# Patient Record
Sex: Male | Born: 1965 | Hispanic: Yes | Marital: Single | Smoking: Never smoker
Health system: Southern US, Community
[De-identification: ages and names within clinical notes are randomized; demographics above are authoritative.]

## PROBLEM LIST (undated history)

## (undated) DIAGNOSIS — E7800 Pure hypercholesterolemia, unspecified: Secondary | ICD-10-CM

## (undated) DIAGNOSIS — E119 Type 2 diabetes mellitus without complications: Secondary | ICD-10-CM

## (undated) DIAGNOSIS — F329 Major depressive disorder, single episode, unspecified: Secondary | ICD-10-CM

## (undated) DIAGNOSIS — F32A Depression, unspecified: Secondary | ICD-10-CM

## (undated) HISTORY — PX: BACK SURGERY: SHX140

---
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
## 2019-10-17 ENCOUNTER — Ambulatory Visit: Payer: Self-pay | Attending: Internal Medicine

## 2019-10-17 DIAGNOSIS — Z23 Encounter for immunization: Secondary | ICD-10-CM

## 2019-11-07 ENCOUNTER — Ambulatory Visit: Payer: Self-pay | Attending: Internal Medicine

## 2019-11-07 DIAGNOSIS — Z23 Encounter for immunization: Secondary | ICD-10-CM

## 2019-11-25 ENCOUNTER — Other Ambulatory Visit: Payer: Self-pay

## 2019-11-25 ENCOUNTER — Emergency Department
Admission: EM | Admit: 2019-11-25 | Discharge: 2019-11-25 | Disposition: A | Discharge status: Home / Self Care | Payer: Self-pay | Attending: Emergency Medicine | Admitting: Emergency Medicine

## 2019-11-25 ENCOUNTER — Emergency Department: Payer: Self-pay

## 2019-11-25 DIAGNOSIS — L03114 Cellulitis of left upper limb: Secondary | ICD-10-CM | POA: Insufficient documentation

## 2019-11-25 DIAGNOSIS — E119 Type 2 diabetes mellitus without complications: Secondary | ICD-10-CM | POA: Insufficient documentation

## 2019-11-25 DIAGNOSIS — L0390 Cellulitis, unspecified: Secondary | ICD-10-CM

## 2019-11-25 HISTORY — DX: Depression, unspecified: F32.A

## 2019-11-25 HISTORY — DX: Type 2 diabetes mellitus without complications: E11.9

## 2019-11-25 HISTORY — DX: Pure hypercholesterolemia, unspecified: E78.00

## 2019-11-25 IMAGING — CR DG FOREARM 2V*L*
2 series · 2 of 2 positions shown · non-contrast
Comparison: None.

CLINICAL DATA: Distal forearm edema.  No reported trauma

EXAM:
LEFT FOREARM - 2 VIEW

[forearm ap]
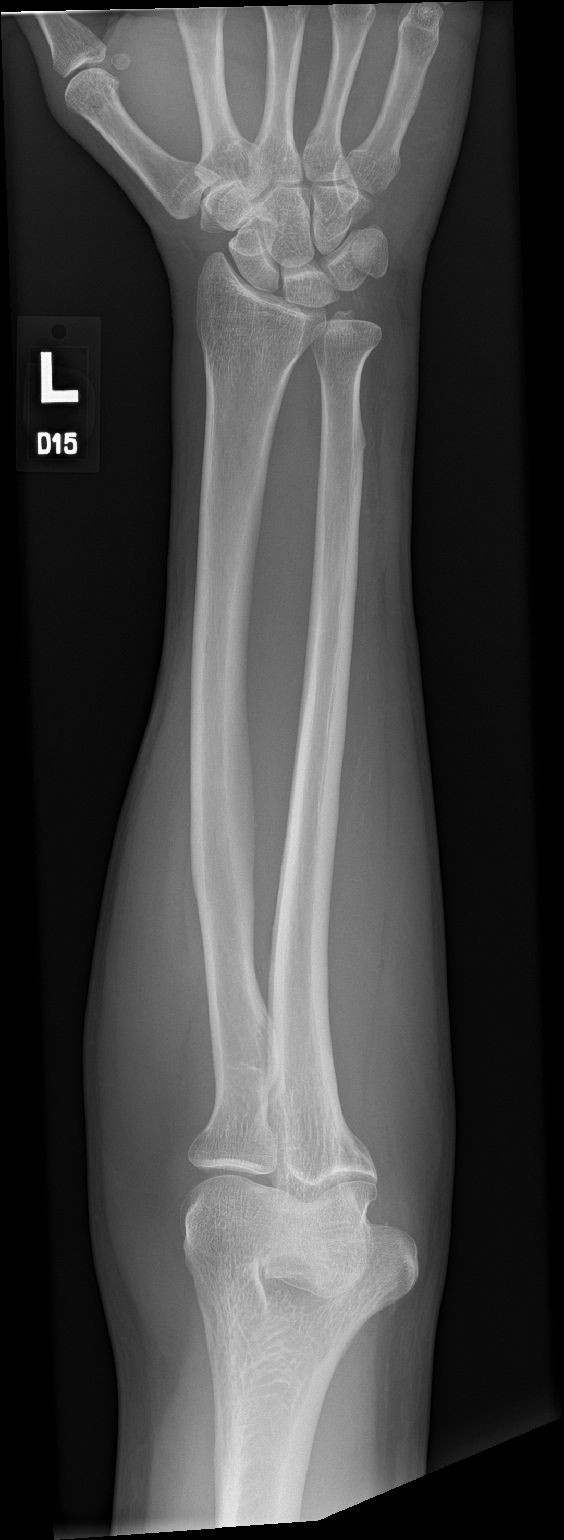

[forearm lat]
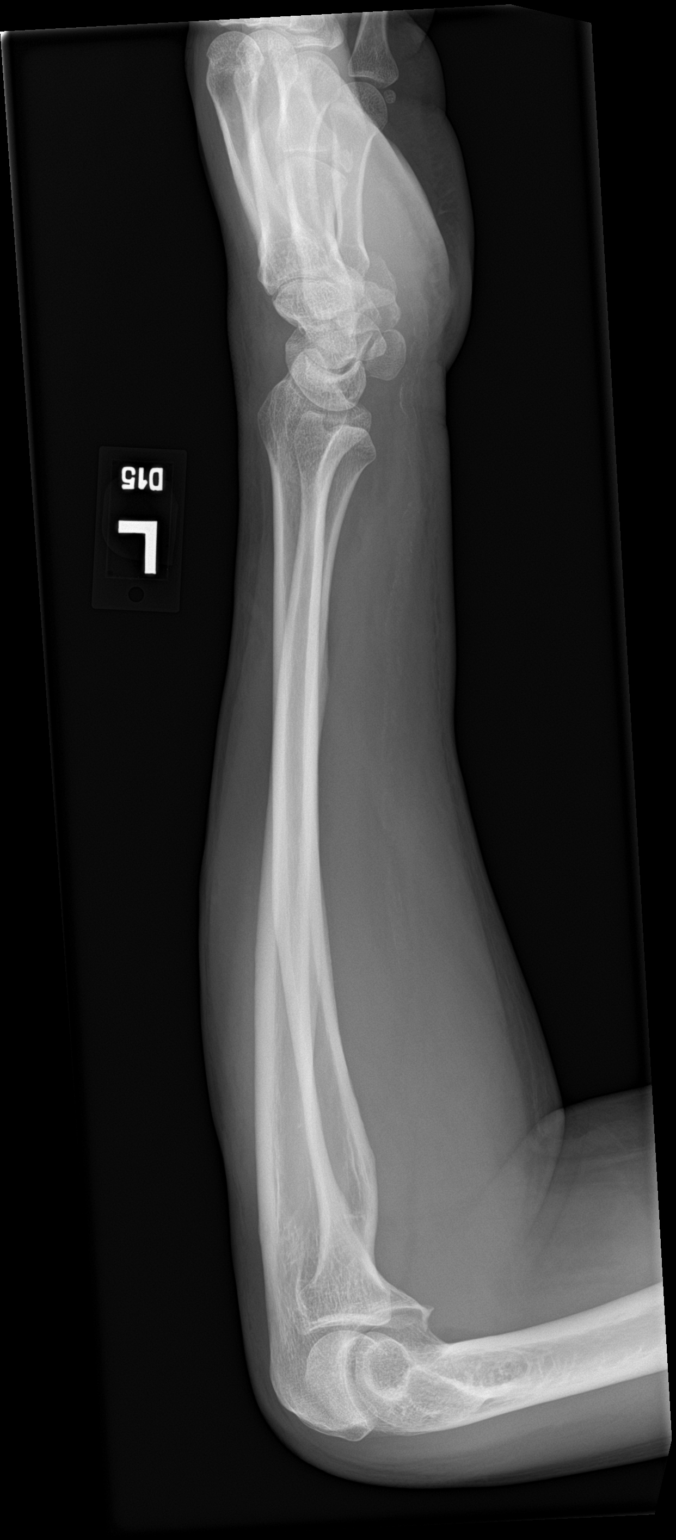

[2 of 2 positions shown; findings below may reference images not displayed]

FINDINGS: At the ventral and distal forearm nonspecific soft tissue swelling
superficially. No opaque foreign body. There is premature arterial
calcification correlating with diabetes history. No fracture or
erosion.
IMPRESSION: 1. Soft tissue swelling without opaque foreign body or gas.
2. Premature arterial calcification

## 2019-11-25 MED ORDER — NAPROXEN 500 MG PO TABS
500.0000 mg | ORAL_TABLET | Freq: Two times a day (BID) | ORAL | 2 refills | Status: AC
Start: 2019-11-25 — End: 2020-11-24

## 2019-11-25 MED ORDER — DOXYCYCLINE HYCLATE 100 MG PO CAPS
100.0000 mg | ORAL_CAPSULE | Freq: Two times a day (BID) | ORAL | 0 refills | Status: AC
Start: 2019-11-25 — End: 2019-12-05

## 2019-11-25 NOTE — ED Provider Notes (Signed)
[facility] Emergency Department Provider Note  ____________________________________________   I have reviewed the triage vital signs and the nursing notes.   HISTORY  Chief Complaint Arm Swelling   History limited by: Not Limited   HPI Jesus Flowers is a 54 y.o. male who presents to the emergency department today because of concerns for left hand swelling and pain.  He states that this started 2 days ago.  He denies any significant trauma to his hand.  States that he might of slept on it oddly the day before the pain started.  He has noticed some warmth to the hand.  Patient denies any fevers, nausea or vomiting.  Records reviewed. Per medical record review patient has a history of DM  Past Medical History:  Diagnosis Date  . Depression   . Diabetes mellitus without complication (HCC)   . High cholesterol     There are no problems to display for this patient.   Prior to Admission medications   Not on File    Allergies Patient has no allergy information on record.  No family history on file.  Social History Social History   Tobacco Use  . Smoking status: Not on file  Substance Use Topics  . Alcohol use: Not on file  . Drug use: Not on file    Review of Systems Constitutional: No fever/chills Eyes: No visual changes. ENT: No sore throat. Cardiovascular: Denies chest pain. Respiratory: Denies shortness of breath. Gastrointestinal: No abdominal pain.  No nausea, no vomiting.  No diarrhea.   Genitourinary: Negative for dysuria. Musculoskeletal: Positive for left hand pain. Skin: Positive for redness to left hand. Neurological: Negative for headaches, focal weakness or numbness.  ____________________________________________   PHYSICAL EXAM:  VITAL SIGNS: ED Triage Vitals [11/25/19 0907]  Enc Vitals Group     BP 110/66     Pulse Rate (!) 108     Resp 16     Temp 98.2 F (36.8 C)     Temp Source Oral     SpO2 99 %   Weight 145 lb (65.8 kg)     Height 5\' 10"  (1.778 m)     Head Circumference      Peak Flow      Pain Score 9    Constitutional: Alert and oriented.  Eyes: Conjunctivae are normal.  ENT      Head: Normocephalic and atraumatic.      Nose: No congestion/rhinnorhea.      Mouth/Throat: Mucous membranes are moist.      Neck: No stridor. Hematological/Lymphatic/Immunilogical: No cervical lymphadenopathy. Cardiovascular: Normal rate, regular rhythm.  No murmurs, rubs, or gallops. Respiratory: Normal respiratory effort without tachypnea nor retractions. Breath sounds are clear and equal bilaterally. No wheezes/rales/rhonchi. Gastrointestinal: Soft and non tender. No rebound. No guarding.  Genitourinary: Deferred Musculoskeletal: Left hand with some erythema and swelling. Warmth. Sensation intact in all fingers. No significant digit swelling.  Neurologic:  Normal speech and language. No gross focal neurologic deficits are appreciated.  Skin:  Skin is warm, dry and intact. No rash noted. Psychiatric: Mood and affect are normal. Speech and behavior are normal. Patient exhibits appropriate insight and judgment.  ____________________________________________    LABS (pertinent positives/negatives)  None  ____________________________________________   EKG  None  ____________________________________________    RADIOLOGY  Left forearm Soft tissue swelling  ____________________________________________   PROCEDURES  Procedures  ____________________________________________   INITIAL IMPRESSION / ASSESSMENT AND PLAN / ED COURSE  Pertinent labs & imaging results that  were available during my care of the patient were reviewed by me and considered in my medical decision making (see chart for details).   Patient presented to the emergency department today because of concerns for left hand swelling and pain.  Exam is consistent with cellulitis.  This point do not think tenosynovitis  is unlikely.  Discussed concern for infection with the patient.  Will plan on starting patient on antibiotics.  Will give patient orthopedic follow-up information  ___________________________________________   FINAL CLINICAL IMPRESSION(S) / ED DIAGNOSES  Final diagnoses:  Cellulitis, unspecified cellulitis site     Note: This dictation was prepared with Dragon dictation. Any transcriptional errors that result from this process are unintentional     Nance Pear, MD 11/25/19 1226

## 2019-11-25 NOTE — ED Notes (Signed)
Patient evaluated and discharged from Ed.

## 2019-11-25 NOTE — Discharge Instructions (Addendum)
Please seek medical attention for any high fevers, chest pain, shortness of breath, change in behavior, persistent vomiting, bloody stool or any other new or concerning symptoms.  

## 2021-05-03 ENCOUNTER — Emergency Department: Payer: Self-pay

## 2021-05-03 ENCOUNTER — Encounter: Payer: Self-pay | Admitting: Emergency Medicine

## 2021-05-03 ENCOUNTER — Other Ambulatory Visit: Payer: Self-pay

## 2021-05-03 ENCOUNTER — Inpatient Hospital Stay: Payer: Self-pay

## 2021-05-03 ENCOUNTER — Inpatient Hospital Stay
Admission: EM | Admit: 2021-05-03 | Discharge: 2021-05-11 | DRG: 854 | Disposition: A | Discharge status: Home / Self Care | Payer: Self-pay | Attending: Family Medicine | Admitting: Family Medicine

## 2021-05-03 DIAGNOSIS — Z20822 Contact with and (suspected) exposure to covid-19: Secondary | ICD-10-CM | POA: Diagnosis present

## 2021-05-03 DIAGNOSIS — E11628 Type 2 diabetes mellitus with other skin complications: Secondary | ICD-10-CM | POA: Diagnosis present

## 2021-05-03 DIAGNOSIS — Z23 Encounter for immunization: Secondary | ICD-10-CM

## 2021-05-03 DIAGNOSIS — L97513 Non-pressure chronic ulcer of other part of right foot with necrosis of muscle: Secondary | ICD-10-CM | POA: Diagnosis present

## 2021-05-03 DIAGNOSIS — Z833 Family history of diabetes mellitus: Secondary | ICD-10-CM

## 2021-05-03 DIAGNOSIS — E7800 Pure hypercholesterolemia, unspecified: Secondary | ICD-10-CM | POA: Insufficient documentation

## 2021-05-03 DIAGNOSIS — A419 Sepsis, unspecified organism: Secondary | ICD-10-CM | POA: Diagnosis present

## 2021-05-03 DIAGNOSIS — M86171 Other acute osteomyelitis, right ankle and foot: Secondary | ICD-10-CM | POA: Diagnosis present

## 2021-05-03 DIAGNOSIS — E1142 Type 2 diabetes mellitus with diabetic polyneuropathy: Secondary | ICD-10-CM

## 2021-05-03 DIAGNOSIS — F32A Depression, unspecified: Secondary | ICD-10-CM | POA: Diagnosis present

## 2021-05-03 DIAGNOSIS — M869 Osteomyelitis, unspecified: Secondary | ICD-10-CM | POA: Insufficient documentation

## 2021-05-03 DIAGNOSIS — I96 Gangrene, not elsewhere classified: Secondary | ICD-10-CM | POA: Diagnosis present

## 2021-05-03 DIAGNOSIS — E119 Type 2 diabetes mellitus without complications: Secondary | ICD-10-CM | POA: Insufficient documentation

## 2021-05-03 DIAGNOSIS — L02611 Cutaneous abscess of right foot: Secondary | ICD-10-CM | POA: Diagnosis present

## 2021-05-03 DIAGNOSIS — E440 Moderate protein-calorie malnutrition: Secondary | ICD-10-CM | POA: Insufficient documentation

## 2021-05-03 DIAGNOSIS — E1169 Type 2 diabetes mellitus with other specified complication: Secondary | ICD-10-CM | POA: Diagnosis present

## 2021-05-03 DIAGNOSIS — E1165 Type 2 diabetes mellitus with hyperglycemia: Secondary | ICD-10-CM | POA: Diagnosis not present

## 2021-05-03 DIAGNOSIS — A408 Other streptococcal sepsis: Principal | ICD-10-CM | POA: Diagnosis present

## 2021-05-03 DIAGNOSIS — N179 Acute kidney failure, unspecified: Secondary | ICD-10-CM | POA: Diagnosis present

## 2021-05-03 DIAGNOSIS — W228XXA Striking against or struck by other objects, initial encounter: Secondary | ICD-10-CM | POA: Diagnosis present

## 2021-05-03 DIAGNOSIS — E11621 Type 2 diabetes mellitus with foot ulcer: Secondary | ICD-10-CM

## 2021-05-03 DIAGNOSIS — I10 Essential (primary) hypertension: Secondary | ICD-10-CM | POA: Diagnosis present

## 2021-05-03 DIAGNOSIS — L03115 Cellulitis of right lower limb: Secondary | ICD-10-CM

## 2021-05-03 DIAGNOSIS — E1152 Type 2 diabetes mellitus with diabetic peripheral angiopathy with gangrene: Secondary | ICD-10-CM | POA: Diagnosis present

## 2021-05-03 DIAGNOSIS — S91331A Puncture wound without foreign body, right foot, initial encounter: Secondary | ICD-10-CM | POA: Diagnosis present

## 2021-05-03 DIAGNOSIS — L089 Local infection of the skin and subcutaneous tissue, unspecified: Secondary | ICD-10-CM | POA: Insufficient documentation

## 2021-05-03 LAB — CBC WITH DIFFERENTIAL/PLATELET
Abs Immature Granulocytes: 0.28 10*3/uL — ABNORMAL HIGH (ref 0.00–0.07)
Basophils Absolute: 0.1 10*3/uL (ref 0.0–0.1)
Basophils Relative: 0 %
Eosinophils Absolute: 0 10*3/uL (ref 0.0–0.5)
Eosinophils Relative: 0 %
HCT: 38.9 % — ABNORMAL LOW (ref 39.0–52.0)
Hemoglobin: 14.1 g/dL (ref 13.0–17.0)
Immature Granulocytes: 1 %
Lymphocytes Relative: 2 %
Lymphs Abs: 0.5 10*3/uL — ABNORMAL LOW (ref 0.7–4.0)
MCH: 34.9 pg — ABNORMAL HIGH (ref 26.0–34.0)
MCHC: 36.2 g/dL — ABNORMAL HIGH (ref 30.0–36.0)
MCV: 96.3 fL (ref 80.0–100.0)
Monocytes Absolute: 0.7 10*3/uL (ref 0.1–1.0)
Monocytes Relative: 3 %
Neutro Abs: 20.6 10*3/uL — ABNORMAL HIGH (ref 1.7–7.7)
Neutrophils Relative %: 94 %
Platelets: 443 10*3/uL — ABNORMAL HIGH (ref 150–400)
RBC: 4.04 MIL/uL — ABNORMAL LOW (ref 4.22–5.81)
RDW: 11.3 % — ABNORMAL LOW (ref 11.5–15.5)
WBC: 22.2 10*3/uL — ABNORMAL HIGH (ref 4.0–10.5)
nRBC: 0 % (ref 0.0–0.2)

## 2021-05-03 LAB — CBG MONITORING, ED
Glucose-Capillary: 364 mg/dL — ABNORMAL HIGH (ref 70–99)
Glucose-Capillary: 276 mg/dL — ABNORMAL HIGH (ref 70–99)

## 2021-05-03 LAB — GLUCOSE, CAPILLARY
Glucose-Capillary: 270 mg/dL — ABNORMAL HIGH (ref 70–99)
Glucose-Capillary: 228 mg/dL — ABNORMAL HIGH (ref 70–99)

## 2021-05-03 LAB — COMPREHENSIVE METABOLIC PANEL
ALT: 18 U/L (ref 0–44)
AST: 15 U/L (ref 15–41)
Albumin: 3.1 g/dL — ABNORMAL LOW (ref 3.5–5.0)
Alkaline Phosphatase: 189 U/L — ABNORMAL HIGH (ref 38–126)
Anion gap: 15 (ref 5–15)
BUN: 31 mg/dL — ABNORMAL HIGH (ref 6–20)
CO2: 20 mmol/L — ABNORMAL LOW (ref 22–32)
Calcium: 9.1 mg/dL (ref 8.9–10.3)
Chloride: 97 mmol/L — ABNORMAL LOW (ref 98–111)
Creatinine, Ser: 1.3 mg/dL — ABNORMAL HIGH (ref 0.61–1.24)
GFR, Estimated: >60 mL/min (ref >60)
Glucose, Bld: 393 mg/dL — ABNORMAL HIGH (ref 70–99)
Potassium: 4.5 mmol/L (ref 3.5–5.1)
Sodium: 132 mmol/L — ABNORMAL LOW (ref 135–145)
Total Bilirubin: 1.1 mg/dL (ref 0.3–1.2)
Total Protein: 8.1 g/dL (ref 6.5–8.1)

## 2021-05-03 LAB — LACTIC ACID, PLASMA
Lactic Acid, Venous: 1.9 mmol/L (ref 0.5–1.9)
Lactic Acid, Venous: 0.9 mmol/L (ref 0.5–1.9)

## 2021-05-03 LAB — RESP PANEL BY RT-PCR (FLU A&B, COVID) ARPGX2
Influenza A by PCR: NEGATIVE
Influenza B by PCR: NEGATIVE
SARS Coronavirus 2 by RT PCR: NEGATIVE

## 2021-05-03 LAB — C-REACTIVE PROTEIN: CRP: 24.2 mg/dL — ABNORMAL HIGH (ref <1.0)

## 2021-05-03 LAB — APTT: aPTT: 30 seconds (ref 24–36)

## 2021-05-03 LAB — PROTIME-INR
INR: 1.2 (ref 0.8–1.2)
Prothrombin Time: 14.9 seconds (ref 11.4–15.2)

## 2021-05-03 LAB — SEDIMENTATION RATE: Sed Rate: 77 mm/hr — ABNORMAL HIGH (ref 0–20)

## 2021-05-03 LAB — PROCALCITONIN: Procalcitonin: 1.63 ng/mL

## 2021-05-03 IMAGING — CR DG FOOT COMPLETE 3+V*R*
1 series · 3 of 3 positions shown · non-contrast
Comparison: None.

CLINICAL DATA: Diabetic with foot infection.

EXAM:
RIGHT FOOT COMPLETE - 3+ VIEW

[Series 1: dg foot complete right · 0.14mm/px · 3 of 3 slices shown]
[im 1/3]
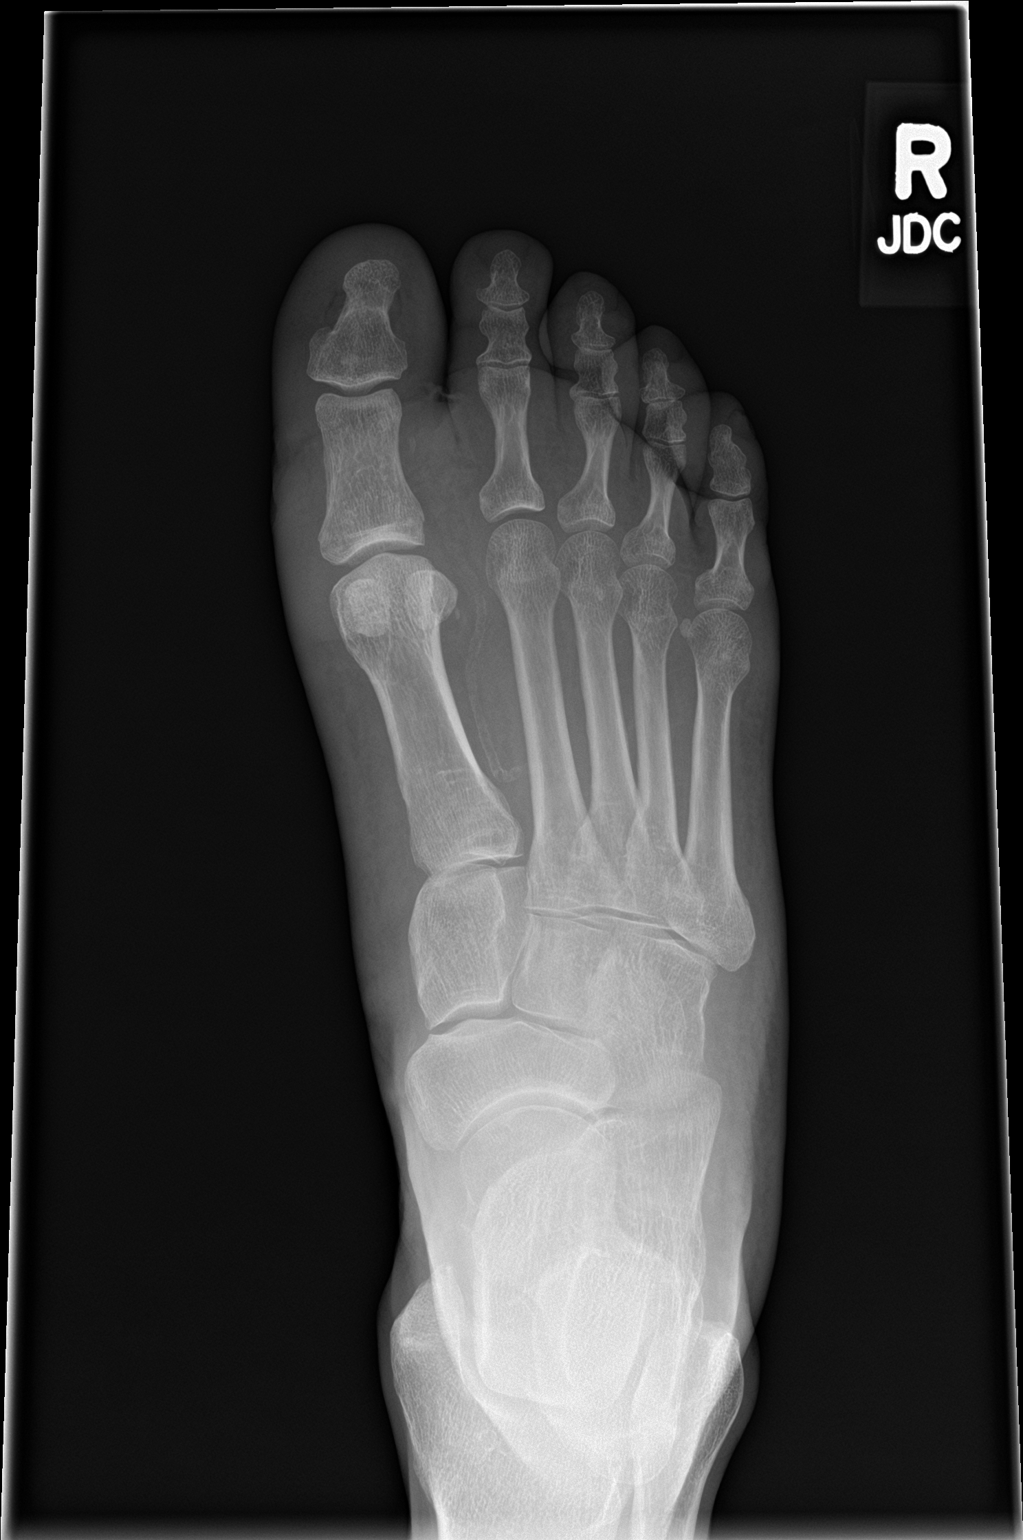
[im 2/3]
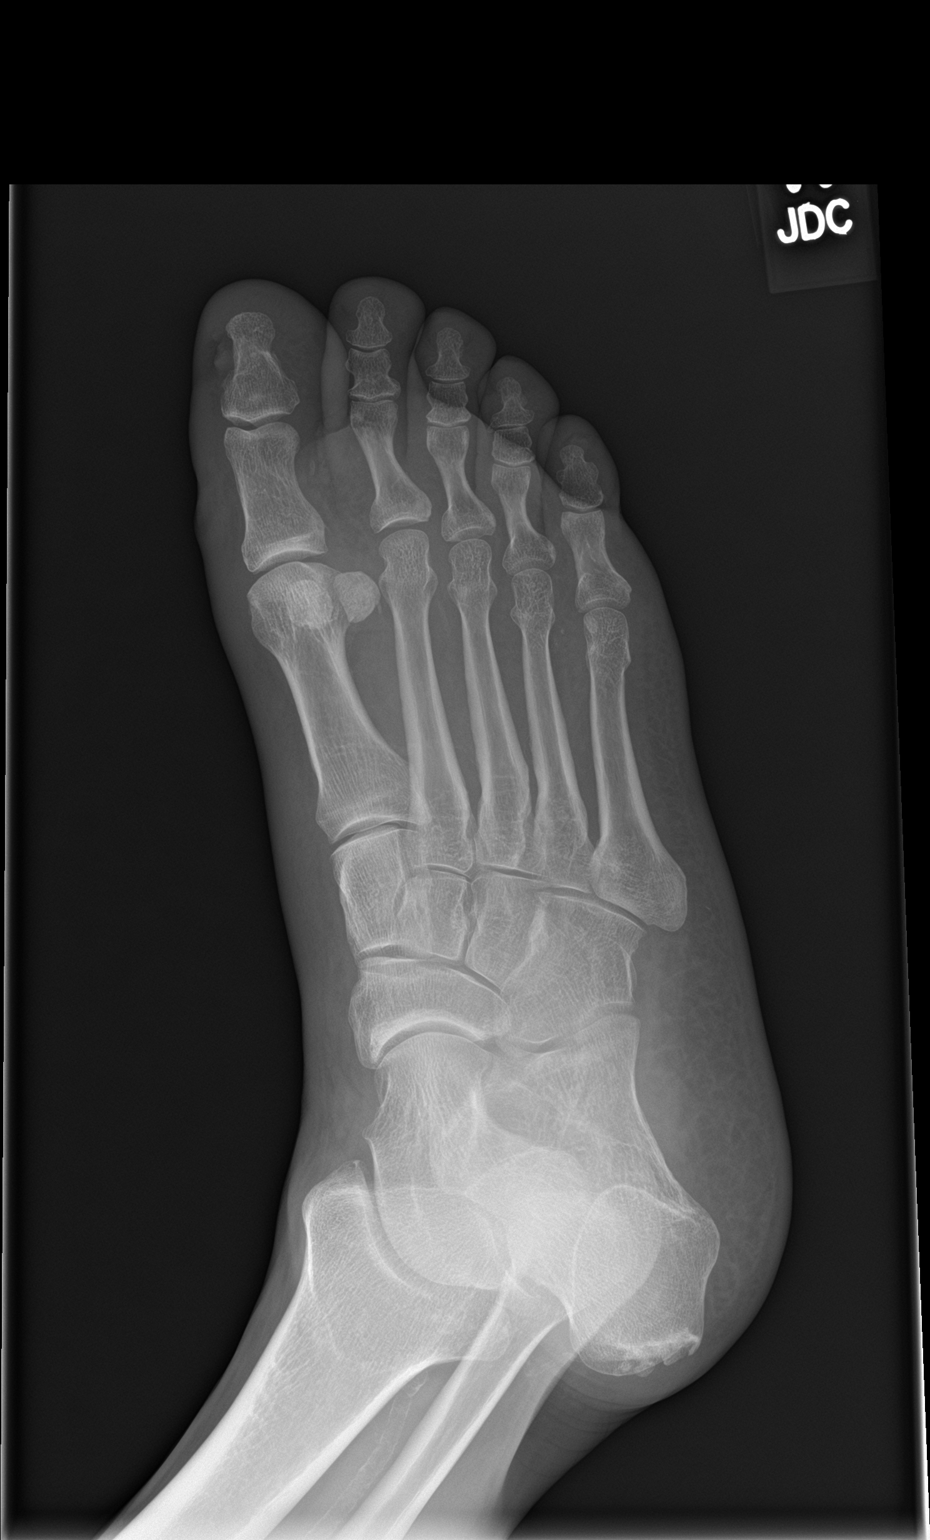
[im 3/3]
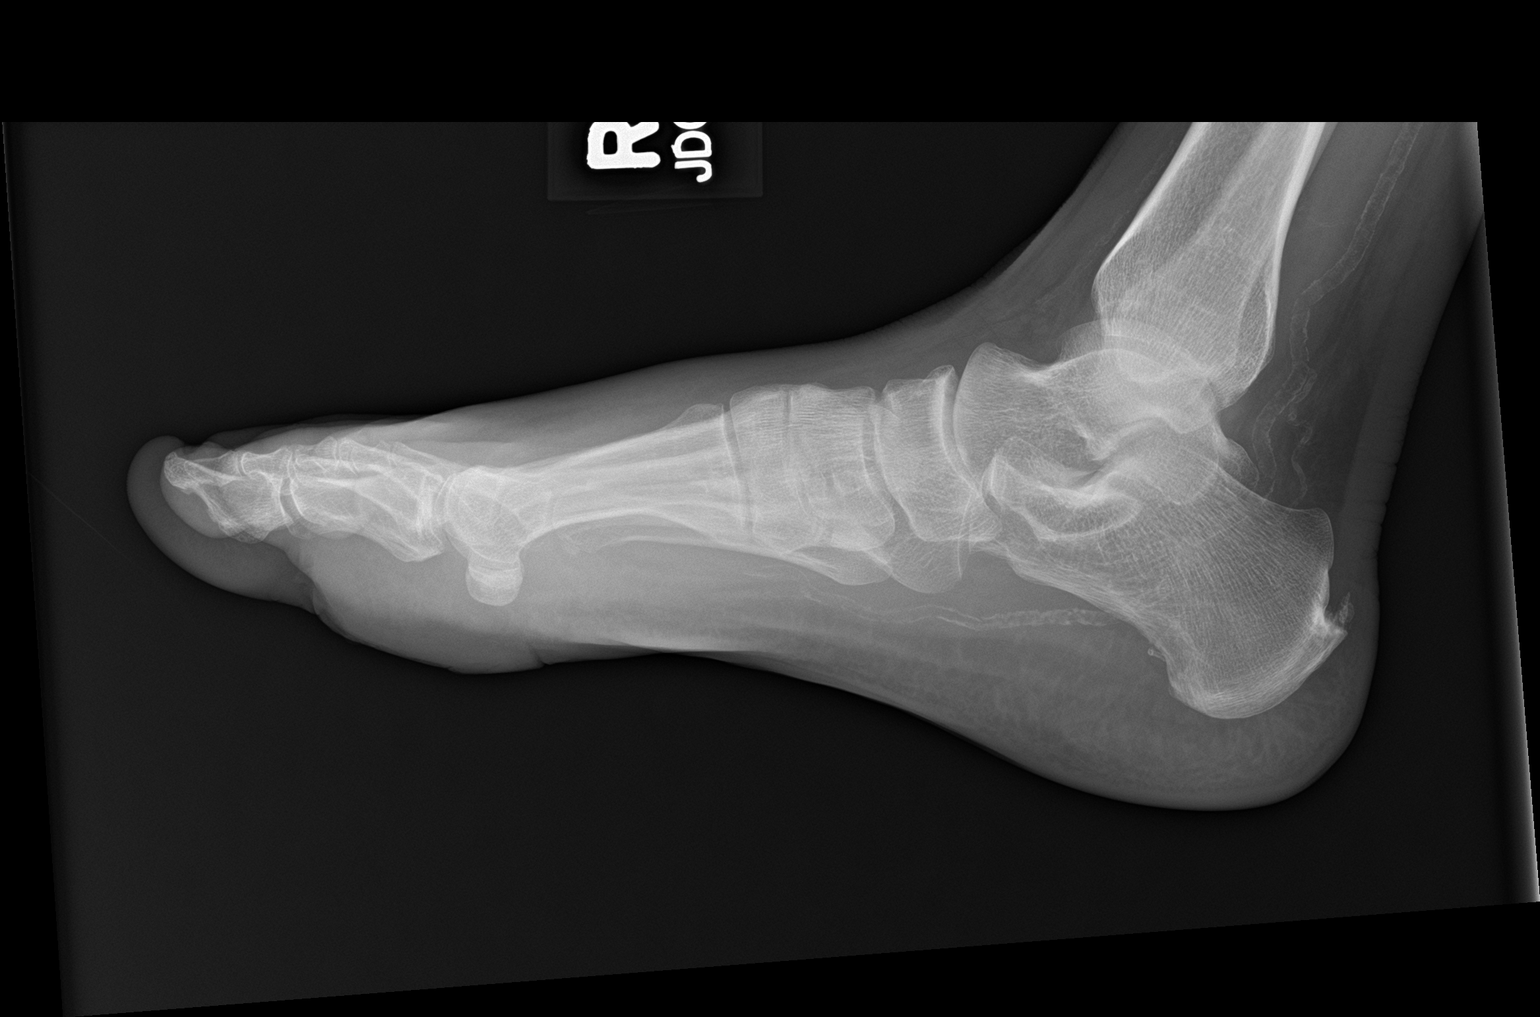

[3 of 3 positions shown; findings below may reference images not displayed]

FINDINGS: Soft tissue swelling of the forefoot. No evidence of fracture. No
radiographic sign of osteomyelitis. Regional arterial calcification
is noted.
IMPRESSION: Swelling of the forefoot.  No radiographic sign of osteomyelitis.

## 2021-05-03 IMAGING — MR MR FOOT*R* WO/W CM
9 series · 40 of 40 positions shown · IV contrast (gadavist)
Comparison: Radiographs, same date.

CLINICAL DATA: Wound at the base of the great toe after stepping on
an object. Redness and swelling.

EXAM:
MRI OF THE RIGHT FOREFOOT WITHOUT AND WITH CONTRAST
TECHNIQUE: Multiplanar, multisequence MR imaging of the right foot was
performed before and after the administration of intravenous
contrast.
CONTRAST:  6mL GADAVIST GADOBUTROL 1 MMOL/ML IV SOLN

[Series 4: T1 · coronal · left · 3.0mm · 0.38mm/px · 7 of 45 slices shown (1 of 3)]
[im 1/45]
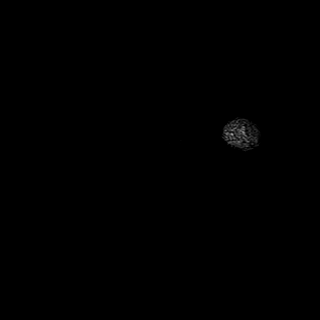
[im 8/45]
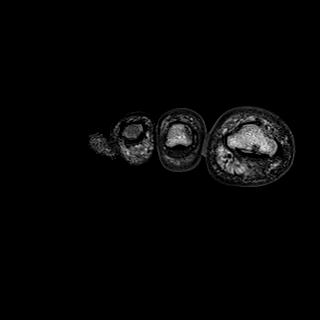
[im 15/45]
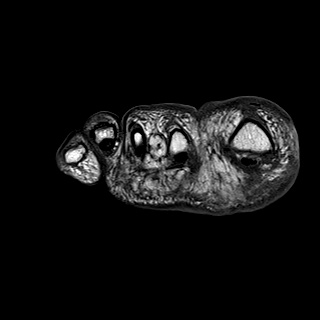
[im 23/45]
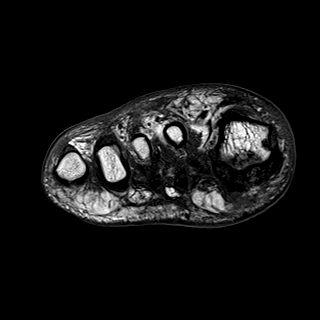
[im 30/45]
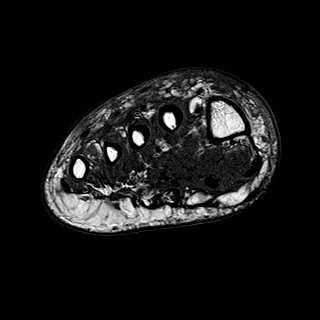
[im 37/45]
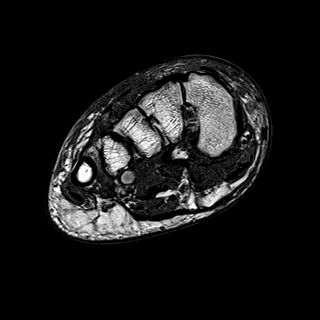
[im 45/45]
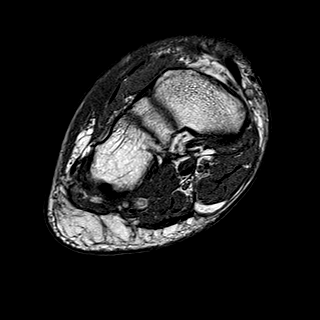

[Series 13: T2 · coronal · left · 3.0mm · 0.50mm/px · 7 of 45 slices shown (1 of 2)]
[im 1/45]
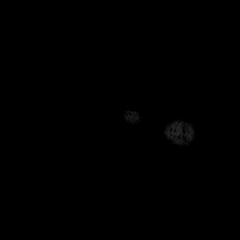
[im 8/45]
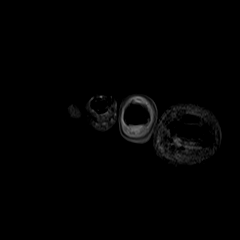
[im 15/45]
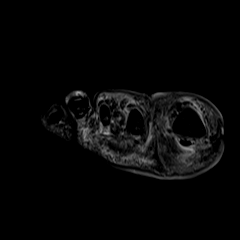
[im 23/45]
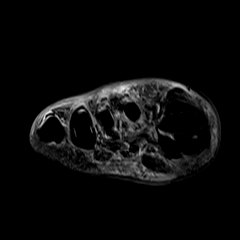
[im 30/45]
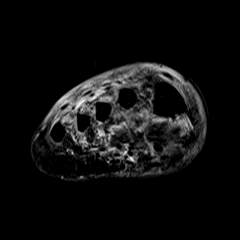
[im 37/45]
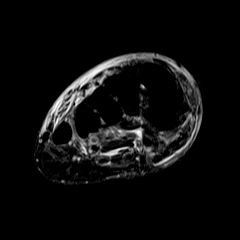
[im 45/45]
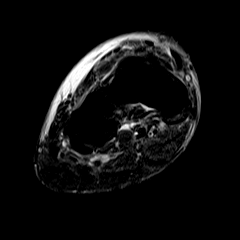

[Series 14: T1 fat-sat · coronal · non-contrast · left · 3.0mm · 0.47mm/px · 6 of 45 slices shown]
[im 1/45]
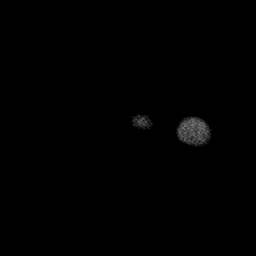
[im 9/45]
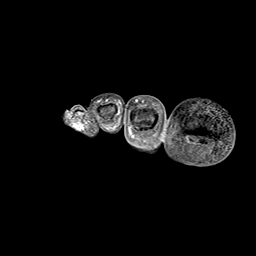
[im 18/45]
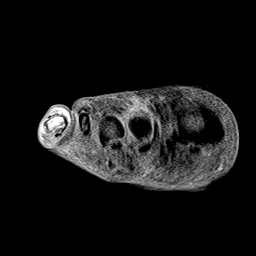
[im 27/45]
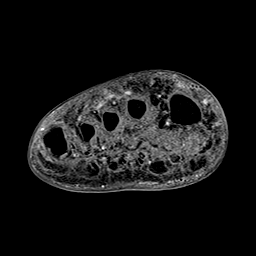
[im 36/45]
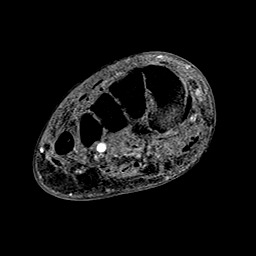
[im 45/45]
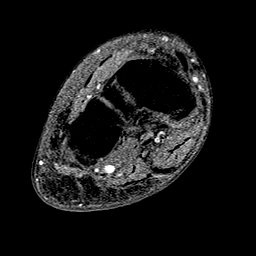

[Series 15: T1 fat-sat post-contrast · coronal · left · 3.0mm · 0.47mm/px · 6 of 45 slices shown]
[im 1/45]
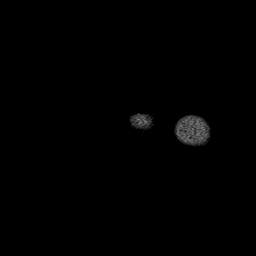
[im 9/45]
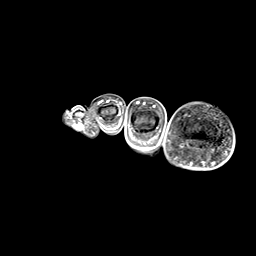
[im 18/45]
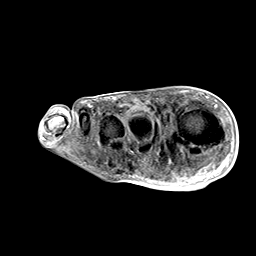
[im 27/45]
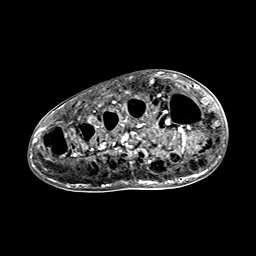
[im 36/45]
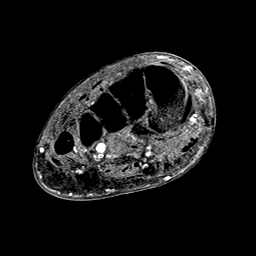
[im 45/45]
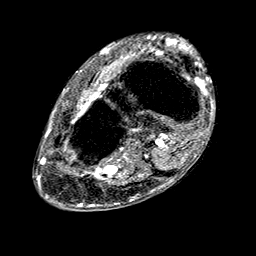

[Series 1011: sag stir_new · oblique · left · 3.0mm · 0.62mm/px · 4 of 32 slices shown]
[im 1/32]
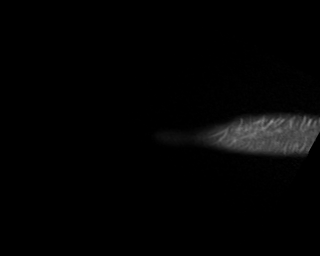
[im 11/32]
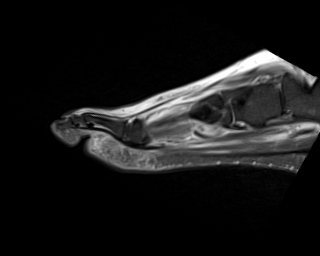
[im 21/32]
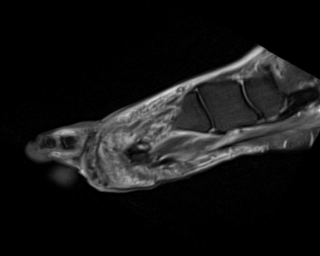
[im 32/32]
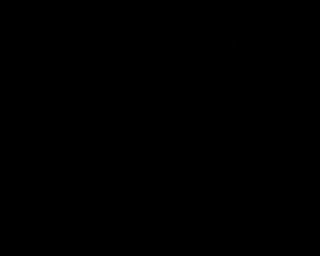

[Series 1021: T2 · oblique · left · 3.0mm · 0.70mm/px · 2 of 18 slices shown (2 of 2)]
[im 1/18]
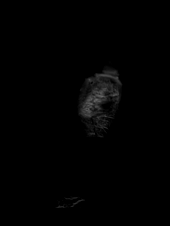
[im 18/18]
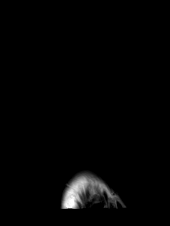

[Series 1028: cor t1_new · oblique · left · 3.0mm · 0.70mm/px · 2 of 18 slices shown]
[im 1/18]
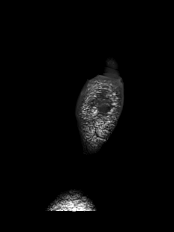
[im 18/18]
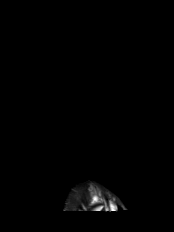

[Series 1040: T1 · oblique · left · 3.0mm · 0.62mm/px · 4 of 32 slices shown (2 of 3)]
[im 1/32]
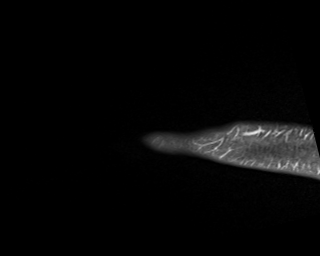
[im 11/32]
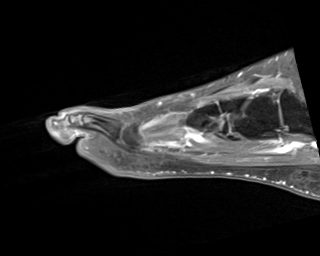
[im 21/32]
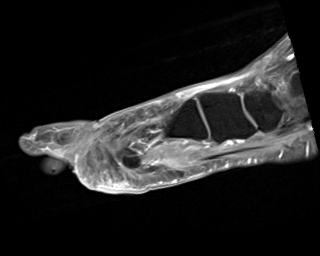
[im 32/32]
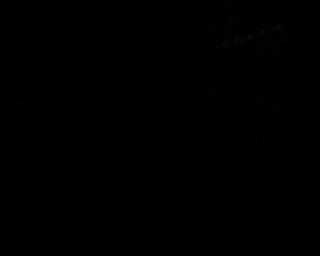

[Series 1048: T1 · oblique · left · 3.0mm · 0.56mm/px · 2 of 17 slices shown (3 of 3)]
[im 1/17]
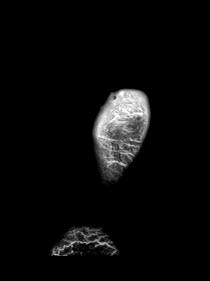
[im 17/17]
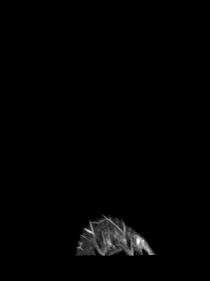

[40 of 40 positions shown; findings below may reference images not displayed]

FINDINGS: Diffuse subcutaneous soft tissue swelling/edema/fluid suggesting
cellulitis. No discrete rim enhancing fluid collection to suggest a
drainable abscess.

Open wound noted on the plantar and medial aspect of the great toe
but no obvious foreign body or abscess.

Diffuse myofasciitis without findings to suggest pyomyositis.

No MR findings suspicious for septic arthritis or osteomyelitis.
IMPRESSION: 1. Diffuse subcutaneous soft tissue swelling/edema/fluid suggesting
cellulitis. No discrete rim enhancing fluid collection to suggest a
drainable abscess.
2. Diffuse myofasciitis without findings to suggest pyomyositis.
3. No MR findings suspicious for septic arthritis or osteomyelitis.

## 2021-05-03 MED ORDER — FLUOXETINE HCL 20 MG PO CAPS
40.0000 mg | ORAL_CAPSULE | Freq: Every day | ORAL | Status: DC | PRN
  Filled 2021-05-03: qty 2

## 2021-05-03 MED ORDER — VANCOMYCIN HCL 1250 MG/250ML IV SOLN
1250.0000 mg | INTRAVENOUS | Status: DC
  Administered 2021-05-04: 1250 mg via INTRAVENOUS
  Filled 2021-05-03: qty 250

## 2021-05-03 MED ORDER — SODIUM CHLORIDE 0.9 % IV BOLUS (SEPSIS)
1000.0000 mL | Freq: Once | INTRAVENOUS | Status: AC
  Administered 2021-05-03: 1000 mL via INTRAVENOUS

## 2021-05-03 MED ORDER — TETANUS-DIPHTH-ACELL PERTUSSIS 5-2.5-18.5 LF-MCG/0.5 IM SUSY
0.5000 mL | PREFILLED_SYRINGE | Freq: Once | INTRAMUSCULAR | Status: AC
  Administered 2021-05-04: 0.5 mL via INTRAMUSCULAR
  Filled 2021-05-03: qty 0.5

## 2021-05-03 MED ORDER — SODIUM CHLORIDE 0.9 % IV SOLN
2.0000 g | Freq: Once | INTRAVENOUS | Status: AC
  Administered 2021-05-03: 2 g via INTRAVENOUS
  Filled 2021-05-03: qty 20

## 2021-05-03 MED ORDER — VANCOMYCIN HCL IN DEXTROSE 1-5 GM/200ML-% IV SOLN: 1000.0000 mg | Freq: Once | INTRAVENOUS | Status: DC

## 2021-05-03 MED ORDER — VANCOMYCIN HCL 1500 MG/300ML IV SOLN
1500.0000 mg | Freq: Once | INTRAVENOUS | Status: AC
  Administered 2021-05-03: 1500 mg via INTRAVENOUS
  Filled 2021-05-03: qty 300

## 2021-05-03 MED ORDER — INSULIN ASPART 100 UNIT/ML IJ SOLN
5.0000 [IU] | Freq: Once | INTRAMUSCULAR | Status: AC
  Administered 2021-05-03: 5 [IU] via SUBCUTANEOUS
  Filled 2021-05-03: qty 1

## 2021-05-03 MED ORDER — ONDANSETRON HCL 4 MG/2ML IJ SOLN: 4.0000 mg | Freq: Three times a day (TID) | INTRAMUSCULAR | Status: DC | PRN

## 2021-05-03 MED ORDER — ACETAMINOPHEN 325 MG PO TABS
650.0000 mg | ORAL_TABLET | Freq: Four times a day (QID) | ORAL | Status: DC | PRN
Start: 2021-05-03 — End: 2021-05-11
  Administered 2021-05-05 (×2): 650 mg via ORAL
  Filled 2021-05-03 (×2): qty 2

## 2021-05-03 MED ORDER — SODIUM CHLORIDE 0.9 % IV SOLN
2.0000 g | Freq: Two times a day (BID) | INTRAVENOUS | Status: DC
  Administered 2021-05-03 – 2021-05-04 (×2): 2 g via INTRAVENOUS
  Filled 2021-05-03 (×3): qty 2

## 2021-05-03 MED ORDER — HEPARIN SODIUM (PORCINE) 5000 UNIT/ML IJ SOLN
5000.0000 [IU] | Freq: Three times a day (TID) | INTRAMUSCULAR | Status: DC
  Administered 2021-05-03 – 2021-05-11 (×20): 5000 [IU] via SUBCUTANEOUS
  Filled 2021-05-03 (×20): qty 1

## 2021-05-03 MED ORDER — SODIUM CHLORIDE 0.9 % IV BOLUS
1000.0000 mL | Freq: Once | INTRAVENOUS | Status: AC
  Administered 2021-05-03: 1000 mL via INTRAVENOUS

## 2021-05-03 MED ORDER — FLUOXETINE HCL 20 MG PO CAPS
40.0000 mg | ORAL_CAPSULE | Freq: Every day | ORAL | Status: DC
  Administered 2021-05-04 – 2021-05-11 (×8): 40 mg via ORAL
  Filled 2021-05-03 (×9): qty 2

## 2021-05-03 MED ORDER — COLLAGENASE 250 UNIT/GM EX OINT
TOPICAL_OINTMENT | Freq: Two times a day (BID) | CUTANEOUS | Status: DC
  Filled 2021-05-03 (×2): qty 30

## 2021-05-03 MED ORDER — GADOBUTROL 1 MMOL/ML IV SOLN
6.0000 mL | Freq: Once | INTRAVENOUS | Status: AC | PRN
  Administered 2021-05-03: 6 mL via INTRAVENOUS

## 2021-05-03 MED ORDER — OXYCODONE-ACETAMINOPHEN 5-325 MG PO TABS
1.0000 | ORAL_TABLET | ORAL | Status: DC | PRN
  Administered 2021-05-03 – 2021-05-06 (×5): 1 via ORAL
  Filled 2021-05-03 (×5): qty 1

## 2021-05-03 MED ORDER — MORPHINE SULFATE (PF) 2 MG/ML IV SOLN
2.0000 mg | INTRAVENOUS | Status: DC | PRN
  Administered 2021-05-10: 2 mg via INTRAVENOUS
  Filled 2021-05-03: qty 1

## 2021-05-03 MED ORDER — INSULIN GLARGINE-YFGN 100 UNIT/ML ~~LOC~~ SOLN
5.0000 [IU] | Freq: Every day | SUBCUTANEOUS | Status: DC
  Administered 2021-05-04 – 2021-05-07 (×3): 5 [IU] via SUBCUTANEOUS
  Filled 2021-05-03 (×5): qty 0.05

## 2021-05-03 MED ORDER — INSULIN ASPART 100 UNIT/ML IJ SOLN
0.0000 [IU] | Freq: Every day | INTRAMUSCULAR | Status: DC
Start: 2021-05-03 — End: 2021-05-11
  Administered 2021-05-03: 2 [IU] via SUBCUTANEOUS
  Filled 2021-05-03: qty 1

## 2021-05-03 MED ORDER — ACETAMINOPHEN 500 MG PO TABS
1000.0000 mg | ORAL_TABLET | Freq: Once | ORAL | Status: AC
  Administered 2021-05-03: 1000 mg via ORAL
  Filled 2021-05-03: qty 2

## 2021-05-03 MED ORDER — INSULIN ASPART 100 UNIT/ML IJ SOLN
0.0000 [IU] | Freq: Three times a day (TID) | INTRAMUSCULAR | Status: DC
  Administered 2021-05-03 (×2): 5 [IU] via SUBCUTANEOUS
  Administered 2021-05-04 (×2): 2 [IU] via SUBCUTANEOUS
  Administered 2021-05-04 – 2021-05-05 (×2): 3 [IU] via SUBCUTANEOUS
  Administered 2021-05-05: 1 [IU] via SUBCUTANEOUS
  Administered 2021-05-06: 2 [IU] via SUBCUTANEOUS
  Administered 2021-05-07: 3 [IU] via SUBCUTANEOUS
  Administered 2021-05-07 – 2021-05-09 (×5): 2 [IU] via SUBCUTANEOUS
  Administered 2021-05-10: 1 [IU] via SUBCUTANEOUS
  Filled 2021-05-03 (×14): qty 1

## 2021-05-03 MED ORDER — SODIUM CHLORIDE 0.9 % IV SOLN: INTRAVENOUS | Status: DC

## 2021-05-03 MED ORDER — HYDRALAZINE HCL 20 MG/ML IJ SOLN: 5.0000 mg | INTRAMUSCULAR | Status: DC | PRN

## 2021-05-03 NOTE — ED Notes (Signed)
Pt provided with warm blankets for comfort. Pt now sleeping, resting comfortably in bed, NAD, chest rise & fall. No additional needs verbalized at this time. Bed low & locked.

## 2021-05-03 NOTE — ED Notes (Signed)
Patient transported to X-ray 

## 2021-05-03 NOTE — ED Provider Notes (Signed)
[facility] Emergency Department Provider Note  Time seen: 10:57 AM  I have reviewed the triage vital signs and the nursing notes.   HISTORY  Chief Complaint Wound Infection   HPI Jesus Flowers is a 55 y.o. male with a past medical history of depression, diabetes, hyperlipidemia, presents to the emergency department for right foot swelling now with fever nausea and vomiting.  According to the patient approximately 1 week ago he stepped on something at work.  Did not think it was a big deal however over the past week he has noticed increased pain and swelling from the right foot.  States over the past 3 days he has now had a fever and over the past day he has been nauseated with occasional episodes of vomiting.   Past Medical History:  Diagnosis Date   Depression    Diabetes mellitus without complication (HCC)    High cholesterol     There are no problems to display for this patient.   Past Surgical History:  Procedure Laterality Date   BACK SURGERY      Prior to Admission medications   Not on File    No Known Allergies  No family history on file.  Social History Social History   Tobacco Use   Smoking status: Never   Smokeless tobacco: Never  Vaping Use   Vaping Use: Never used  Substance Use Topics   Alcohol use: Yes   Drug use: Never    Review of Systems Constitutional: Fever x3 days Cardiovascular: Negative for chest pain. Respiratory: Negative for shortness of breath. Gastrointestinal: Negative for abdominal pain.  Positive for nausea vomiting Genitourinary: Negative for urinary compaints Musculoskeletal: Right foot pain/swelling Neurological: Negative for headache All other ROS negative  ____________________________________________   PHYSICAL EXAM:  VITAL SIGNS: ED Triage Vitals  Enc Vitals Group     BP 05/03/21 0927 (!) 176/91     Pulse Rate 05/03/21 0927 (!) 129     Resp 05/03/21 0927 17     Temp 05/03/21 0927  100.3 F (37.9 C)     Temp Source 05/03/21 0927 Oral     SpO2 05/03/21 0927 98 %     Weight 05/03/21 0928 152 lb (68.9 kg)     Height 05/03/21 0928 5\' 6"  (1.676 m)     Head Circumference --      Peak Flow --      Pain Score 05/03/21 0928 10     Pain Loc --      Pain Edu? --      Excl. in GC? --    Constitutional: Alert and oriented. Well appearing and in no distress. Eyes: Normal exam ENT      Head: Normocephalic and atraumatic.      Mouth/Throat: Mucous membranes are moist. Cardiovascular: Normal rate, regular rhythm. Respiratory: Normal respiratory effort without tachypnea nor retractions. Breath sounds are clear Gastrointestinal: Soft and nontender. No distention.  Musculoskeletal: Nontender with normal range of motion in all extremities. Neurologic:  Normal speech and language. No gross focal neurologic deficits Skin:  Skin is warm, dry and intact.  Psychiatric: Mood and affect are normal.   ____________________________________________    RADIOLOGY  Right foot x-ray shows swelling but no osteomyelitis  ____________________________________________   INITIAL IMPRESSION / ASSESSMENT AND PLAN / ED COURSE  Pertinent labs & imaging results that were available during my care of the patient were reviewed by me and considered in my medical decision making (see chart for details).  Patient presents emergency department for right foot pain and swelling now fever nausea vomiting.  Patient has a low-grade temperature 100.3, tachycardic.  Labs are resulted showing significant leukocytosis of 22,000 meeting sepsis criteria.  We will start the patient on broad-spectrum antibiotics, send cultures, lactic acid.  X-ray shows swelling but no osteomyelitis.  Patient will require admission to the hospital service for further work-up and treatment.  Jesus Flowers was evaluated in Emergency Department on 05/03/2021 for the symptoms described in the history of present illness. He was evaluated  in the context of the global COVID-19 pandemic, which necessitated consideration that the patient might be at risk for infection with the SARS-CoV-2 virus that causes COVID-19. Institutional protocols and algorithms that pertain to the evaluation of patients at risk for COVID-19 are in a state of rapid change based on information released by regulatory bodies including the CDC and federal and state organizations. These policies and algorithms were followed during the patient's care in the ED.  CRITICAL CARE Performed by: Minna Antis   Total critical care time: 30 minutes  Critical care time was exclusive of separately billable procedures and treating other patients.  Critical care was necessary to treat or prevent imminent or life-threatening deterioration.  Critical care was time spent personally by me on the following activities: development of treatment plan with patient and/or surrogate as well as nursing, discussions with consultants, evaluation of patient's response to treatment, examination of patient, obtaining history from patient or surrogate, ordering and performing treatments and interventions, ordering and review of laboratory studies, ordering and review of radiographic studies, pulse oximetry and re-evaluation of patient's condition.  ____________________________________________   FINAL CLINICAL IMPRESSION(S) / ED DIAGNOSES  Cellulitis Sepsis Diabetic foot infection   Minna Antis, MD 05/03/21 1059

## 2021-05-03 NOTE — Consult Note (Signed)
CODE SEPSIS - PHARMACY COMMUNICATION  **Broad Spectrum Antibiotics should be administered within 1 hour of Sepsis diagnosis**  Time Code Sepsis Called/Page Received: 1050  Antibiotics Ordered: 1050  Time of 1st antibiotic administration: 1104  Additional action taken by pharmacy: N/A  If necessary, Name of Provider/Nurse Contacted: N/A    Derrek Gu ,PharmD Clinical Pharmacist  05/03/2021  10:52 AM

## 2021-05-03 NOTE — ED Notes (Signed)
Dr. Clyde Lundborg at [facility]

## 2021-05-03 NOTE — Progress Notes (Signed)
Inpatient Diabetes Program Recommendations  AACE/ADA: New Consensus Statement on Inpatient Glycemic Control   Target Ranges:  Prepandial:   less than 140 mg/dL      Peak postprandial:   less than 180 mg/dL (1-2 hours)      Critically ill patients:  140 - 180 mg/dL   Results for Jesus Flowers, Jesus Flowers (MRN [id number]) as of 05/03/2021 14:01  Ref. Range 05/03/2021 09:37 05/03/2021 13:28  Glucose-Capillary Latest Ref Range: 70 - 99 mg/dL 364 (H) 276 (H)   Results for Jesus Flowers, Jesus Flowers (MRN [id number]) as of 05/03/2021 14:01  Ref. Range 05/03/2021 09:49  Glucose Latest Ref Range: 70 - 99 mg/dL 393 (H)   Review of Glycemic Control  Diabetes history: DM2 Outpatient Diabetes medications: Jardiance 25 mg daily, Janumet 50-1000 mg BID Current orders for Inpatient glycemic control: Novolog 0-9 units TID with meals, Novolog 0-5 units QHS, Semglee 5 units daily  NOTE: Spoke with patient with Estill Bamberg (onsite interpreter at [facility]) about diabetes and home regimen for diabetes control. Patient reports being followed by [facility] for diabetes management and currently 2 oral DM medications for DM control.  Patient states he does not check glucose at home.  Patient states he is familiar with an A1C but does not recall his prior A1C; just notes that it has been high. Informed patient that current A1C is in process and that he will likely be asked to start checking his glucose as an outpatient. Patient is agreeable to checking glucose outpatient.  Patient verbalized understanding of information discussed and reports no further questions at this time related to diabetes. Will plan to follow up with patient once A1C resulted. Will place consult TOC for medication assistance and to see if they can provide glucose monitoring kit to patient. [facility] and was informed patient is prescribed Jardiance 25 mg daily and Janumet 50-1000 mg BID for DM control.   Thanks, Barnie Alderman, RN, MSN,  CDE Diabetes Coordinator Inpatient Diabetes Program [phone] (Team Pager)

## 2021-05-03 NOTE — Consult Note (Signed)
WOC Nurse Consult Note: Reason for Consult:Right great toe work injury.  Stepped on something a week ago. Today presents with erythema, purulence and systemic symptoms such as nausea.  Is being covered with Rocephin and vancomycin. Podiatry consult pending.  Wound type:infectious Pressure Injury POA: NA Measurement: Right great toe with 1 cm nonintact lesion to lateral aspect and maceration with breakdown around posterior aspect of toe.  Plantar breakdown present as well, measuring 1 cm x 0.5 cm scabbed.  Wound bed: scabbed, devitalized tissue  Drainage (amount, consistency, odor) minimal serosanguinous  no odor.  Periwound:erythema, edema and pain to right toe and foot.  Dressing procedure/placement/frequency: Cleanse right toe wounds with NS and pat dry. Apply Santyl to scabbed lesions.  Cover with NS moist gauze and secure with dry gauze and tape.  Change each shift.  Will not follow at this time.  Please re-consult if needed.  Maple Hudson MSN, RN, FNP-BC CWON Wound, Ostomy, Continence Nurse Pager [phone]

## 2021-05-03 NOTE — ED Notes (Signed)
Pt resting comfortably in bed, NAD, family at [facility]. No needs verbalized at this time. Bed low & locked.

## 2021-05-03 NOTE — ED Triage Notes (Signed)
Pt in via [facility] from home, reports being diabetic, stepping on something at work approximately one week ago.  Area has since developed into a circumferential wound around great toe; significant area of necrotic tissue noted to left great toe laterally.  Redness, warmth noted into second toe and up the foot.  Febrile, tachycardic upon arrival.    Per EMS, CBG 455.

## 2021-05-03 NOTE — Consult Note (Signed)
Pharmacy Antibiotic Note  Jesus Flowers is a 55 y.o. male admitted on 05/03/2021 with right foot wound infection. X-ray of right foot is negative for osteomyelitis. Pharmacy has been consulted for vancomycin and cefepime dosing.  Plan: Vancomycin 1500 mg IV loading dose, followed by 1250 mg IV q24h Goal AUC 400-550  Est AUC: 480.1 Est Cmax: 35.2 Est Cmin: 10.8 Calculated with SCr 1.3, Vd 0.72  Cefepime 2 g IV q12h  Monitor clinical picture, renal function, and vancomycin levels at steady state F/U C&S, abx deescalation / LOT   Height: 5\' 6"  (167.6 cm) Weight: 68.9 kg (152 lb) IBW/kg (Calculated) : 63.8  Temp (24hrs), Avg:100.3 F (37.9 C), Min:100.3 F (37.9 C), Max:100.3 F (37.9 C)  Recent Labs  Lab 05/03/21 0949  WBC 22.2*  CREATININE 1.30*  LATICACIDVEN 1.9    Estimated Creatinine Clearance: 57.9 mL/min (A) (by C-G formula based on SCr of 1.3 mg/dL (H)).    No Known Allergies  Antimicrobials this admission: 10/6 ceftriaxone x1 10/6 vancomycin >>  10/6 cefepime >>   Dose adjustments this admission:  N/A  Microbiology results: 10/6 BCx: Pending 10/6 UCx: Pending    Thank you for allowing pharmacy to be a part of this patient's care.  12/6, PharmD 05/03/2021 11:55 AM

## 2021-05-03 NOTE — H&P (Addendum)
History and Physical    Jesus Flowers Jesus Flowers DOB: 1965-12-07 DOA: 05/03/2021  Referring MD/NP/PA:   PCP: Pcp, No   Patient coming from:  The patient is coming from home.  At baseline, pt is independent for most of ADL.        Chief Complaint: right foot wound infection.   HPI: Jesus Flowers is a 55 y.o. male with medical history significant of hypertension, hyperlipidemia, diabetes mellitus, depression, who presents with right foot wound infection.  Pt states that he he stepped on something at work 1 week ago. He did not think it was a big deal. He developed pain and swelling in right foot over the past week, which has been progressively worsening. He developed fever and chills in the past 3 days. His body temperature is 100.3 in ED. Patient also has nausea and occasional vomiting.  No diarrhea or abdominal pain.  Patient denies chest pain, cough, shortness of breath.  No symptoms of UTI  ED Course: pt was found to have WBC 22.2, lactic acid of 1.9, pending COVID-19 PCR, AKI with creatinine 1.30, BUN 31, blood pressure 132/88, heart rate 129, RR 17, oxygen saturation 98% on room air.  X-ray of right foot is negative for osteomyelitis.  Patient is admitted to [facility] bed as inpatient. Dr. Posey Pronto of podiatry is consulted.  Review of Systems:   General: has fevers, chills, no body weight gain, fatigue HEENT: no blurry vision, hearing changes or sore throat Respiratory: no dyspnea, coughing, wheezing CV: no chest pain, no palpitations GI: no nausea, vomiting, abdominal pain, diarrhea, constipation GU: no dysuria, burning on urination, increased urinary frequency, hematuria  Ext: no leg edema Neuro: no unilateral weakness, numbness, or tingling, no vision change or hearing loss Skin: no rash, no skin tear. MSK: No muscle spasm, no deformity, no limitation of range of movement in spin Heme: No easy bruising.  Travel history: No recent long distant travel.  Allergy: No Known  Allergies  Past Medical History:  Diagnosis Date   Depression    Diabetes mellitus without complication (HCC)    High cholesterol     Past Surgical History:  Procedure Laterality Date   BACK SURGERY      Social History:  reports that he has never smoked. He has never used smokeless tobacco. He reports current alcohol use. He reports that he does not use drugs.  Family History:  Family History  Problem Relation Age of Onset   Diabetes Mellitus II Sister      Prior to Admission medications   Not on File    Physical Exam: Vitals:   05/03/21 0927 05/03/21 0928 05/03/21 1042  BP: (!) 176/91  132/88  Pulse: (!) 129  (!) 110  Resp: 17  16  Temp: 100.3 F (37.9 C)    TempSrc: Oral    SpO2: 98%  98%  Weight:  68.9 kg   Height:  5' 6"  (1.676 m)    General: Not in acute distress HEENT:       Eyes: PERRL, EOMI, no scleral icterus.       ENT: No discharge from the ears and nose, no pharynx injection, no tonsillar enlargement.        Neck: No JVD, no bruit, no mass felt. Heme: No neck lymph node enlargement. Cardiac: S1/S2, RRR, No murmurs, No gallops or rubs. Respiratory: No rales, wheezing, rhonchi or rubs. GI: Soft, nondistended, nontender, no rebound pain, no organomegaly, BS present. GU: No hematuria Ext: 1+DP/PT  pulse bilaterally. Has a necrotic wound at the base of right great toe, with surrounding erythema, swelling, tenderness, warmth.         Musculoskeletal: No joint deformities, No joint redness or warmth, no limitation of ROM in spin. Skin: No rashes.  Neuro: Alert, oriented X3, cranial nerves II-XII grossly intact, moves all extremities normally.  Psych: Patient is not psychotic, no suicidal or hemocidal ideation.  Labs on Admission: I have personally reviewed following labs and imaging studies  CBC: Recent Labs  Lab 05/03/21 0949  WBC 22.2*  NEUTROABS 20.6*  HGB 14.1  HCT 38.9*  MCV 96.3  PLT 951*   Basic Metabolic Panel: Recent Labs  Lab  05/03/21 0949  NA 132*  K 4.5  CL 97*  CO2 20*  GLUCOSE 393*  BUN 31*  CREATININE 1.30*  CALCIUM 9.1   GFR: Estimated Creatinine Clearance: 57.9 mL/min (A) (by C-G formula based on SCr of 1.3 mg/dL (H)). Liver Function Tests: Recent Labs  Lab 05/03/21 0949  AST 15  ALT 18  ALKPHOS 189*  BILITOT 1.1  PROT 8.1  ALBUMIN 3.1*   No results for input(s): LIPASE, AMYLASE in the last 168 hours. No results for input(s): AMMONIA in the last 168 hours. Coagulation Profile: No results for input(s): INR, PROTIME in the last 168 hours. Cardiac Enzymes: No results for input(s): CKTOTAL, CKMB, CKMBINDEX, TROPONINI in the last 168 hours. BNP (last 3 results) No results for input(s): PROBNP in the last 8760 hours. HbA1C: No results for input(s): HGBA1C in the last 72 hours. CBG: Recent Labs  Lab 05/03/21 0937  GLUCAP 364*   Lipid Profile: No results for input(s): CHOL, HDL, LDLCALC, TRIG, CHOLHDL, LDLDIRECT in the last 72 hours. Thyroid Function Tests: No results for input(s): TSH, T4TOTAL, FREET4, T3FREE, THYROIDAB in the last 72 hours. Anemia Panel: No results for input(s): VITAMINB12, FOLATE, FERRITIN, TIBC, IRON, RETICCTPCT in the last 72 hours. Urine analysis: No results found for: COLORURINE, APPEARANCEUR, LABSPEC, PHURINE, GLUCOSEU, HGBUR, BILIRUBINUR, KETONESUR, PROTEINUR, UROBILINOGEN, NITRITE, LEUKOCYTESUR Sepsis Labs: @LABRCNTIP (procalcitonin:4,lacticidven:4) )No results found for this or any previous visit (from the past 240 hour(s)).   Radiological Exams on Admission: DG Foot Complete Right  Result Date: 05/03/2021 CLINICAL DATA:  Diabetic with foot infection. EXAM: RIGHT FOOT COMPLETE - 3+ VIEW COMPARISON:  None. FINDINGS: Soft tissue swelling of the forefoot. No evidence of fracture. No radiographic sign of osteomyelitis. Regional arterial calcification is noted. IMPRESSION: Swelling of the forefoot.  No radiographic sign of osteomyelitis. Electronically Signed    By: Nelson Chimes M.D.   On: 05/03/2021 10:38     EKG:  Not done in ED, will get one.   Assessment/Plan Principal Problem:   Right foot infection Active Problems:   High cholesterol   Diabetes mellitus without complication (HCC)   Depression   Sepsis (HCC)   AKI (acute kidney injury) ([place])   HTN (hypertension)   Sepsis due to right foot wound infection: Patient meets criteria for sepsis with WBC 22.2, tachycardia with heart rate of 129.  Also has fever 100.3.  Lactic acid is normal.  Currently hemodynamically stable.  Not sure if patient had penetration injury.  Dr. Posey Pronto of podiatry is consulted.  - Admitted to Med[facility] bed as inpatient - Empiric antimicrobial treatment with vancomycin and cefepime (patient received 1 dose of Rocephin in ED) - PRN Zofran for nausea, morphine and Percocet for pain - Blood cultures x 2  - ESR and CRP - wound care consult - will get  Procalcitonin - IVF: 2.0 L of NS bolus in ED, followed by 125 cc/h  HTN: Patient not taking medications currently.  Blood pressure 132/88. -prn hydralazine  High cholesterol: Patient is not taking medications currently -f/u with PCP  Diabetes mellitus without complication ([place]): No H3G on record.  Blood sugar 393, anion gap 15.  Looks like poorly controlled.  Patient is taking Jardiance at home -Start Lantus 5 units daily -Sliding scale insulin  Depression -Prozac  AKI (acute kidney injury) ([place]): Creatinine 1.0, BUN 31, no baseline creatinine available -IV fluid as above -Avoid using renal toxic medications    DVT ppx: SQ Heparin      Code Status: Full code Family Communication:   Yes, patient's son at bed side Disposition Plan:  Anticipate discharge back to previous environment Consults called:  Dr. Posey Pronto of podiatry Admission status and  Level of care: Med-Surg:      as inpt         Status is: Inpatient  Remains inpatient appropriate because:Inpatient level of care appropriate due to severity of  illness  Dispo: The patient is from: Home              Anticipated d/c is to: Home              Patient currently is not medically stable to d/c.   Difficult to place patient No           Date of Service 05/03/2021    Ivor Costa [facility]   If 7PM-7AM, please contact night-coverage [email or web address] 05/03/2021, 12:02 PM

## 2021-05-03 NOTE — Consult Note (Signed)
PHARMACY -  BRIEF ANTIBIOTIC NOTE   Pharmacy has received consult(s) for vancomycin from an ED provider.  The patient's profile has been reviewed for ht/wt/allergies/indication/available labs.    One time order(s) placed for vancomycin 1500 mg IV   Further antibiotics/pharmacy consults should be ordered by admitting physician if indicated.                       Thank you, Derrek Gu, PharmD 05/03/2021  10:53 AM

## 2021-05-03 NOTE — Sepsis Progress Note (Signed)
Code sepsis protocol being monitored by elink. Initial lactic acid 1.9. Blood cx have been drawn, antibiotics and fluid resuscitation started.

## 2021-05-03 NOTE — ED Notes (Signed)
Secure msg sent to Dorthey Sawyer., RN for SBAR on pt. Per [place], okay to send pt.

## 2021-05-04 DIAGNOSIS — L089 Local infection of the skin and subcutaneous tissue, unspecified: Secondary | ICD-10-CM

## 2021-05-04 LAB — CBC
HCT: 36.1 % — ABNORMAL LOW (ref 39.0–52.0)
Hemoglobin: 12.3 g/dL — ABNORMAL LOW (ref 13.0–17.0)
MCH: 33.8 pg (ref 26.0–34.0)
MCHC: 34.1 g/dL (ref 30.0–36.0)
MCV: 99.2 fL (ref 80.0–100.0)
Platelets: 393 10*3/uL (ref 150–400)
RBC: 3.64 MIL/uL — ABNORMAL LOW (ref 4.22–5.81)
RDW: 11.7 % (ref 11.5–15.5)
WBC: 19.3 10*3/uL — ABNORMAL HIGH (ref 4.0–10.5)
nRBC: 0 % (ref 0.0–0.2)

## 2021-05-04 LAB — GLUCOSE, CAPILLARY
Glucose-Capillary: 185 mg/dL — ABNORMAL HIGH (ref 70–99)
Glucose-Capillary: 208 mg/dL — ABNORMAL HIGH (ref 70–99)
Glucose-Capillary: 166 mg/dL — ABNORMAL HIGH (ref 70–99)
Glucose-Capillary: 100 mg/dL — ABNORMAL HIGH (ref 70–99)

## 2021-05-04 LAB — BASIC METABOLIC PANEL
Anion gap: 9 (ref 5–15)
BUN: 25 mg/dL — ABNORMAL HIGH (ref 6–20)
CO2: 19 mmol/L — ABNORMAL LOW (ref 22–32)
Calcium: 8.4 mg/dL — ABNORMAL LOW (ref 8.9–10.3)
Chloride: 105 mmol/L (ref 98–111)
Creatinine, Ser: 0.97 mg/dL (ref 0.61–1.24)
GFR, Estimated: >60 mL/min (ref >60)
Glucose, Bld: 182 mg/dL — ABNORMAL HIGH (ref 70–99)
Potassium: 4.5 mmol/L (ref 3.5–5.1)
Sodium: 133 mmol/L — ABNORMAL LOW (ref 135–145)

## 2021-05-04 LAB — HEMOGLOBIN A1C
Hgb A1c MFr Bld: 12.3 % — ABNORMAL HIGH (ref 4.8–5.6)
Mean Plasma Glucose: 306 mg/dL

## 2021-05-04 LAB — HIV ANTIBODY (ROUTINE TESTING W REFLEX): HIV Screen 4th Generation wRfx: NONREACTIVE

## 2021-05-04 MED ORDER — EMPAGLIFLOZIN 25 MG PO TABS
25.0000 mg | ORAL_TABLET | Freq: Every day | ORAL | Status: DC
  Administered 2021-05-04 – 2021-05-11 (×8): 25 mg via ORAL
  Filled 2021-05-04 (×8): qty 1

## 2021-05-04 MED ORDER — LINAGLIPTIN 5 MG PO TABS
5.0000 mg | ORAL_TABLET | Freq: Every day | ORAL | Status: DC
  Administered 2021-05-04 – 2021-05-11 (×8): 5 mg via ORAL
  Filled 2021-05-04 (×8): qty 1

## 2021-05-04 MED ORDER — METFORMIN HCL 500 MG PO TABS
1000.0000 mg | ORAL_TABLET | Freq: Two times a day (BID) | ORAL | Status: DC
  Administered 2021-05-04 – 2021-05-09 (×9): 1000 mg via ORAL
  Filled 2021-05-04 (×9): qty 2

## 2021-05-04 MED ORDER — SITAGLIPTIN PHOS-METFORMIN HCL 50-1000 MG PO TABS: 1.0000 | ORAL_TABLET | Freq: Two times a day (BID) | ORAL | Status: DC

## 2021-05-04 MED ORDER — SODIUM CHLORIDE 0.9 % IV SOLN
2.0000 g | Freq: Three times a day (TID) | INTRAVENOUS | Status: DC
  Administered 2021-05-04 – 2021-05-05 (×3): 2 g via INTRAVENOUS
  Filled 2021-05-04 (×6): qty 2

## 2021-05-04 MED ORDER — VANCOMYCIN HCL 750 MG/150ML IV SOLN
750.0000 mg | Freq: Two times a day (BID) | INTRAVENOUS | Status: DC
  Administered 2021-05-05: 750 mg via INTRAVENOUS
  Filled 2021-05-04 (×2): qty 150

## 2021-05-04 NOTE — Progress Notes (Signed)
[facility] [place] NAME: Jesus Flowers    MR#:  [id number]  DATE OF BIRTH:  1965/10/10  SUBJECTIVE:  patient here with right great toe infection after injury at work. Denies any pain at present.  REVIEW OF SYSTEMS:   Review of Systems  Constitutional:  Negative for chills, fever and weight loss.  HENT:  Negative for ear discharge, ear pain and nosebleeds.   Eyes:  Negative for blurred vision, pain and discharge.  Respiratory:  Negative for sputum production, shortness of breath, wheezing and stridor.   Cardiovascular:  Negative for chest pain, palpitations, orthopnea and PND.  Gastrointestinal:  Negative for abdominal pain, diarrhea, nausea and vomiting.  Genitourinary:  Negative for frequency and urgency.  Musculoskeletal:  Negative for back pain and joint pain.  Neurological:  Positive for weakness. Negative for sensory change, speech change and focal weakness.  Psychiatric/Behavioral:  Negative for depression and hallucinations. The patient is not nervous/anxious.   Tolerating Diet: Tolerating PT:   DRUG ALLERGIES:  No Known Allergies  VITALS:  Blood pressure (!) 143/91, pulse 87, temperature 98.3 F (36.8 C), resp. rate 15, height _0  (1.676 m), weight 68.9 kg, SpO2 100 %.  PHYSICAL EXAMINATION:   Physical Exam  GENERAL:  56 y.o.-year-old patient lying in the bed with no acute distress.  HEENT: Head atraumatic, normocephalic. Oropharynx and nasopharynx clear.  NECK:  Supple, no jugular venous distention. No thyroid enlargement, no tenderness.  LUNGS: Normal breath sounds bilaterally, no wheezing, rales, rhonchi. No use of accessory muscles of respiration.  CARDIOVASCULAR: S1, S2 normal. No murmurs, rubs, or gallops.  ABDOMEN: Soft, nontender, nondistended. Bowel sounds present. No organomegaly or mass.  EXTREMITIES: No cyanosis, clubbing or edema b/l.    NEUROLOGIC: ++ sensory deficits b/l.  Rest nonfocal PSYCHIATRIC:   patient is alert and oriented x 3.  SKIN: No obvious rash, lesion, or ulcer.   LABORATORY PANEL:  CBC Recent Labs  Lab 05/04/21 0604  WBC 19.3*  HGB 12.3*  HCT 36.1*  PLT 393    Chemistries  Recent Labs  Lab 05/03/21 0949 05/04/21 0604  NA 132* 133*  K 4.5 4.5  CL 97* 105  CO2 20* 19*  GLUCOSE 393* 182*  BUN 31* 25*  CREATININE 1.30* 0.97  CALCIUM 9.1 8.4*  AST 15  --   ALT 18  --   ALKPHOS 189*  --   BILITOT 1.1  --    Cardiac Enzymes No results for input(s): TROPONINI in the last 168 hours. RADIOLOGY:  MR FOOT RIGHT W WO CONTRAST  Result Date: 05/03/2021 CLINICAL DATA:  Wound at the base of the great toe after stepping on an object. Redness and swelling. EXAM: MRI OF THE RIGHT FOREFOOT WITHOUT AND WITH CONTRAST TECHNIQUE: Multiplanar, multisequence MR imaging of the right foot was performed before and after the administration of intravenous contrast. CONTRAST:  31m GADAVIST GADOBUTROL 1 MMOL/ML IV SOLN COMPARISON:  Radiographs, same date. FINDINGS: Diffuse subcutaneous soft tissue swelling/edema/fluid suggesting cellulitis. No discrete rim enhancing fluid collection to suggest a drainable abscess. Open wound noted on the plantar and medial aspect of the great toe but no obvious foreign body or abscess. Diffuse myofasciitis without findings to suggest pyomyositis. No MR findings suspicious for septic arthritis or osteomyelitis. IMPRESSION: 1. Diffuse subcutaneous soft tissue swelling/edema/fluid suggesting cellulitis. No discrete rim enhancing fluid collection to suggest a drainable abscess. 2. Diffuse myofasciitis without findings to suggest pyomyositis. 3. No MR findings suspicious for  septic arthritis or osteomyelitis. Electronically Signed   By: Marijo Sanes M.D.   On: 05/03/2021 16:15   DG Foot Complete Right  Result Date: 05/03/2021 CLINICAL DATA:  Diabetic with foot infection. EXAM: RIGHT FOOT COMPLETE - 3+ VIEW COMPARISON:  None. FINDINGS: Soft tissue swelling of  the forefoot. No evidence of fracture. No radiographic sign of osteomyelitis. Regional arterial calcification is noted. IMPRESSION: Swelling of the forefoot.  No radiographic sign of osteomyelitis. Electronically Signed   By: Nelson Chimes M.D.   On: 05/03/2021 10:38   ASSESSMENT AND PLAN:    Jesus Flowers is a 55 y.o. male with medical history significant of hypertension, hyperlipidemia, diabetes mellitus, depression, who presents with right foot wound infection.  Pt states that he he stepped on something at work 1 week ago. He did not think it was a big deal. He developed pain and swelling in right foot over the past week.  Sepsis due to right foot wound infection: Patient meets criteria for sepsis with WBC 22.2, tachycardia with heart rate of 129.  Also has fever 100.3.  Lactic acid is normal.  Currently hemodynamically stable.    - Empiric antimicrobial treatment with vancomycin and cefepime (patient received 1 dose of Rocephin in ED) - PRN Zofran for nausea, morphine and Percocet for pain - Blood cultures x 2 so far neg - ESR and CRP elevated -received IVF--d/c now --MRI foot noted--no abscess or fluid collection. S/o cellulitis --Dr Sherryle Lis to see pt--likely to get debridement at bedside   HTN: Patient not taking medications currently.  Blood pressure 132/88. -prn hydralazine   High cholesterol: Patient is not taking medications currently -f/u with PCP   Diabetes mellitus without complication ([place]): No Y7W on record.  Blood sugar 393, anion gap 15.  Looks like poorly controlled.  Patient is taking Jardiance at home -Start Lantus 5 units daily while inhouse --Resume Jardiance,Janumet -Sliding scale insulin   Depression -Prozac   AKI (acute kidney injury) ([place]): Creatinine 1.0, BUN 31, no baseline creatinine available -IV fluid as above--d/c  -Avoid using renal toxic medications       DVT ppx: SQ Heparin      Code Status: Full code Family Communication: none  today Disposition Plan:  Anticipate discharge back to previous environment Consults called:  Dr. Sherryle Lis podiatry Admission status and  Level of care: Med-Surg:      as inpt           Status is: Inpatient   Remains inpatient appropriate because:Inpatient level of care appropriate due to severity of illness   Dispo: The patient is from: Home              Anticipated d/c is to: Home              Patient currently is not medically stable to d/c.              Difficult to place patient No       TOTAL TIME TAKING CARE OF THIS PATIENT: 25 minutes.  >50% time spent on counselling and coordination of care  Note: This dictation was prepared with Dragon dictation along with smaller phrase technology. Any transcriptional errors that result from this process are unintentional.  Fritzi Mandes M.D    [facility]   CC: Primary care physician; Pcp, No Patient ID: Jesus Flowers, male   DOB: 1966/04/13, 55 y.o.   MRN: [id number]

## 2021-05-04 NOTE — Progress Notes (Addendum)
Inpatient Diabetes Program Recommendations  AACE/ADA: New Consensus Statement on Inpatient Glycemic Control   Target Ranges:  Prepandial:   less than 140 mg/dL      Peak postprandial:   less than 180 mg/dL (1-2 hours)      Critically ill patients:  140 - 180 mg/dL   Results for CADEL, STAIRS (MRN [id number]) as of 05/04/2021 09:11  Ref. Range 05/03/2021 09:37 05/03/2021 13:28 05/03/2021 16:08 05/03/2021 21:48 05/04/2021 07:41  Glucose-Capillary Latest Ref Range: 70 - 99 mg/dL 364 (H) 276 (H) 270 (H) 228 (H) 185 (H)   Review of Glycemic Control  Diabetes history: DM2 Outpatient Diabetes medications: Jardiance 25 mg daily, Janumet 50-1000 mg BID Current orders for Inpatient glycemic control: Novolog 0-9 units TID with meals, Novolog 0-5 units QHS, Semglee 5 units daily  Inpatient Diabetes Program Recommendations:    Insulin: In reviewing chart, noted Semglee was NOT GIVEN on 05/03/21 (charted as Not Given, "daily med; patient already took"). Patient does not take insulin outpatient. Since fasting glucose is 185 mg/dl this morning, would not recommend any changes with Semglee at this time.  HbgA1C: A1C in process.  Addendum 05/04/21@11 :45- Spoke with patient with [place] (onsite Spanish interpreter at [facility]). Patient reports that he is taking DM medications consistently as prescribed. Patient states that he does not have a glucometer but he has checked his glucose before so he knows how to monitor glucose. Informed patient that TOC has been consulted to see if they can provide glucose monitoring kit.  Informed patient that an A1C has been ordered but was not resulted prior to conversation. Asked patient to access MyChart for A1C results if not informed of results prior to discharge.  Discussed glucose and A1C goals. Discussed importance of checking CBGs and maintaining good CBG control to prevent long-term and short-term complications. Explained how hyperglycemia leads to damage within blood vessels  which lead to the common complications seen with uncontrolled diabetes. Stressed to the patient the importance of improving glycemic control to prevent further complications from uncontrolled diabetes especially given foot infection and importance of wound healing. Discussed impact of nutrition, exercise, stress, sickness, and medications on diabetes control.  Encouraged patient to check glucose at least 2 times per day and take glucometer with him to follow up appointments. Explained how the provider can use the glucose values to help make adjustments with DM medications if needed.  Patient verbalized understanding of information discussed and reports no further questions at this time related to diabetes.  Thanks, Barnie Alderman, RN, MSN, CDE Diabetes Coordinator Inpatient Diabetes Program [phone] (Team Pager from 8am to 5pm)

## 2021-05-04 NOTE — TOC Progression Note (Signed)
Transition of Care [facility]) - Progression Note    Patient Details  Name: Jesus Flowers MRN: [id number] Date of Birth: April 02, 1966  Transition of Care [facility]) CM/SW [place], RN Phone Number: 05/04/2021, 12:29 PM  Clinical Narrative:    Met with the patient to discuss DC plan and needs He [facility] and gets his medication thru a community program, he stated that if he needs ABX he can afford to get them on the 4$ list at the regular pharmacy, he has transportation with his son, he is ambulatory unassisted and does not need DME, no additional needs        Expected Discharge Plan and Services                                                 Social Determinants of Health (SDOH) Interventions    Readmission Risk Interventions No flowsheet data found.

## 2021-05-04 NOTE — Consult Note (Signed)
Pharmacy Antibiotic Note  Jesus Flowers is a 55 y.o. male admitted on 05/03/2021 with right foot wound infection. X-ray of right foot is negative for osteomyelitis. Pharmacy has been consulted for vancomycin and cefepime dosing.  Plan: Scr improved 1.30>>0.97  -Will adjust Vancomycin 1250 mg IV q24h to 750 mg Q12h Goal AUC 400-550  Est AUC: 439 Est Cmax: 26.9 Est Cmin: 12.6 Calculated with SCr 0.97, Vd 0.72  -adjust Cefepime 2 g IV q12h to q8h  Monitor clinical picture, renal function, and vancomycin levels at steady state F/U C&S, abx deescalation / LOT   Height: 5\' 6"  (167.6 cm) Weight: 68.9 kg (152 lb) IBW/kg (Calculated) : 63.8  Temp (24hrs), Avg:98 F (36.7 C), Min:97.6 F (36.4 C), Max:98.3 F (36.8 C)  Recent Labs  Lab 05/03/21 0949 05/03/21 1452 05/04/21 0604  WBC 22.2*  --  19.3*  CREATININE 1.30*  --  0.97  LATICACIDVEN 1.9 0.9  --      Estimated Creatinine Clearance: 77.6 mL/min (by C-G formula based on SCr of 0.97 mg/dL).    No Known Allergies  Antimicrobials this admission: 10/6 ceftriaxone x1 10/6 vancomycin >>  10/6 cefepime >>   Dose adjustments this admission: 10/7:  Cefepime from 2 gm q12h to q8h  Vanc from 1250mg  IV q24h to 750mg  q12h  Microbiology results: 10/6 BCx: NG <24 hrs 10/6 UCx: Pending   10/7 f/u I&D cx?  Thank you for allowing pharmacy to be a part of this patient's care.  Ahmaya Ostermiller A, PharmD 05/04/2021 1:36 PM

## 2021-05-04 NOTE — Consult Note (Signed)
Reason for Consult: Right foot infection puncture wound Referring Physician: Dr. Ernestene Kiel is an 55 y.o. male.  HPI: He works in a [employer] and thinks may have stepped on something may be a splinter or piece of wood.  He is not sure.  The infection developed about a week and a half ago.  Is been continually getting worse.  Past Medical History:  Diagnosis Date   Depression    Diabetes mellitus without complication (HCC)    High cholesterol     Past Surgical History:  Procedure Laterality Date   BACK SURGERY      Family History  Problem Relation Age of Onset   Diabetes Mellitus II Sister     Social History:  reports that he has never smoked. He has never used smokeless tobacco. He reports current alcohol use. He reports that he does not use drugs.  Allergies: No Known Allergies  Medications: I have reviewed the patient's current medications.  Results for orders placed or performed during the hospital encounter of 05/03/21 (from the past 48 hour(s))  CBG monitoring, ED     Status: Abnormal   Collection Time: 05/03/21  9:37 AM  Result Value Ref Range   Glucose-Capillary 364 (H) 70 - 99 mg/dL    Comment: Glucose reference range applies only to samples taken after fasting for at least 8 hours.   Comment 1 Notify RN    Comment 2 Document in Chart   Lactic acid, plasma     Status: None   Collection Time: 05/03/21  9:49 AM  Result Value Ref Range   Lactic Acid, Venous 1.9 0.5 - 1.9 mmol/L    Comment: Performed at [facility], [address] [place], [place] [address]  Comprehensive metabolic panel     Status: Abnormal   Collection Time: 05/03/21  9:49 AM  Result Value Ref Range   Sodium 132 (L) 135 - 145 mmol/L   Potassium 4.5 3.5 - 5.1 mmol/L   Chloride 97 (L) 98 - 111 mmol/L   CO2 20 (L) 22 - 32 mmol/L   Glucose, Bld 393 (H) 70 - 99 mg/dL    Comment: Glucose reference range applies only to samples taken after fasting for at least 8 hours.    BUN 31 (H) 6 - 20 mg/dL   Creatinine, Ser 2.29 (H) 0.61 - 1.24 mg/dL   Calcium 9.1 8.9 - 79.8 mg/dL   Total Protein 8.1 6.5 - 8.1 g/dL   Albumin 3.1 (L) 3.5 - 5.0 g/dL   AST 15 15 - 41 U/L   ALT 18 0 - 44 U/L   Alkaline Phosphatase 189 (H) 38 - 126 U/L   Total Bilirubin 1.1 0.3 - 1.2 mg/dL   GFR, Estimated >92 >11 mL/min    Comment: (NOTE) Calculated using the CKD-EPI Creatinine Equation (2021)    Anion gap 15 5 - 15    Comment: Performed at [facility], [address] [place], [place] [address]  CBC with Differential     Status: Abnormal   Collection Time: 05/03/21  9:49 AM  Result Value Ref Range   WBC 22.2 (H) 4.0 - 10.5 K/uL   RBC 4.04 (L) 4.22 - 5.81 MIL/uL   Hemoglobin 14.1 13.0 - 17.0 g/dL   HCT 08.1 (L) 44.8 - 18.5 %   MCV 96.3 80.0 - 100.0 fL   MCH 34.9 (H) 26.0 - 34.0 pg   MCHC 36.2 (H) 30.0 - 36.0 g/dL  RDW 11.3 (L) 11.5 - 15.5 %   Platelets 443 (H) 150 - 400 K/uL   nRBC 0.0 0.0 - 0.2 %   Neutrophils Relative % 94 %   Neutro Abs 20.6 (H) 1.7 - 7.7 K/uL   Lymphocytes Relative 2 %   Lymphs Abs 0.5 (L) 0.7 - 4.0 K/uL   Monocytes Relative 3 %   Monocytes Absolute 0.7 0.1 - 1.0 K/uL   Eosinophils Relative 0 %   Eosinophils Absolute 0.0 0.0 - 0.5 K/uL   Basophils Relative 0 %   Basophils Absolute 0.1 0.0 - 0.1 K/uL   Immature Granulocytes 1 %   Abs Immature Granulocytes 0.28 (H) 0.00 - 0.07 K/uL    Comment: Performed at [facility] [address] [place], [place] [address]  Sedimentation rate     Status: Abnormal   Collection Time: 05/03/21  9:49 AM  Result Value Ref Range   Sed Rate 77 (H) 0 - 20 mm/hr    Comment: Performed at [facility], [address] [place], [place] [address]  Procalcitonin     Status: None   Collection Time: 05/03/21  9:49 AM  Result Value Ref Range   Procalcitonin 1.63 ng/mL    Comment:        Interpretation: PCT > 0.5 ng/mL and <= 2 ng/mL: Systemic infection (sepsis) is possible, but other  conditions are known to elevate PCT as well. (NOTE)       Sepsis PCT Algorithm           Lower Respiratory Tract                                      Infection PCT Algorithm    ----------------------------     ----------------------------         PCT < 0.25 ng/mL                PCT < 0.10 ng/mL          Strongly encourage             Strongly discourage   discontinuation of antibiotics    initiation of antibiotics    ----------------------------     -----------------------------       PCT 0.25 - 0.50 ng/mL            PCT 0.10 - 0.25 ng/mL               OR       >80% decrease in PCT            Discourage initiation of                                            antibiotics      Encourage discontinuation           of antibiotics    ----------------------------     -----------------------------         PCT >= 0.50 ng/mL              PCT 0.26 - 0.50 ng/mL                AND       <80% decrease in PCT             Encourage  initiation of                                             antibiotics       Encourage continuation           of antibiotics    ----------------------------     -----------------------------        PCT >= 0.50 ng/mL                  PCT > 0.50 ng/mL               AND         increase in PCT                  Strongly encourage                                      initiation of antibiotics    Strongly encourage escalation           of antibiotics                                     -----------------------------                                           PCT <= 0.25 ng/mL                                                 OR                                        > 80% decrease in PCT                                      Discontinue / Do not initiate                                             antibiotics  Performed at [facility], [address], [place], [place] [address]   Blood Culture (routine x 2)     Status: None (Preliminary result)   Collection  Time: 05/03/21 10:50 AM   Specimen: BLOOD  Result Value Ref Range   Specimen Description BLOOD  LFOA    Special Requests      BOTTLES DRAWN AEROBIC AND ANAEROBIC Blood Culture results may not be optimal due to an inadequate volume of blood received in culture bottles   Culture      NO GROWTH < 24 HOURS Performed at [facility], [address], [place], [place] [address]    Report Status PENDING   Blood Culture (routine x 2)  Status: None (Preliminary result)   Collection Time: 05/03/21 10:55 AM   Specimen: BLOOD  Result Value Ref Range   Specimen Description BLOOD RAC    Special Requests      BOTTLES DRAWN AEROBIC AND ANAEROBIC Blood Culture results may not be optimal due to an inadequate volume of blood received in culture bottles   Culture      NO GROWTH < 24 HOURS Performed at [facility], [address], [place], [place] [address]    Report Status PENDING   Resp Panel by RT-PCR (Flu A&B, Covid) Nasopharyngeal Swab     Status: None   Collection Time: 05/03/21 11:13 AM   Specimen: Nasopharyngeal Swab; Nasopharyngeal(NP) swabs in vial transport medium  Result Value Ref Range   SARS Coronavirus 2 by RT PCR NEGATIVE NEGATIVE    Comment: (NOTE) SARS-CoV-2 target nucleic acids are NOT DETECTED.  The SARS-CoV-2 RNA is generally detectable in upper respiratory specimens during the acute phase of infection. The lowest concentration of SARS-CoV-2 viral copies this assay can detect is 138 copies/mL. A negative result does not preclude SARS-Cov-2 infection and should not be used as the sole basis for treatment or other patient management decisions. A negative result may occur with  improper specimen collection/handling, submission of specimen other than nasopharyngeal swab, presence of viral mutation(s) within the areas targeted by this assay, and inadequate number of viral copies(<138 copies/mL). A negative result must be combined with clinical observations,  patient history, and epidemiological information. The expected result is Negative.  Fact Sheet for Patients:  BloggerCourse.com  Fact Sheet for Healthcare Providers:  SeriousBroker.it  This test is no t yet approved or cleared by the [place] FDA and  has been authorized for detection and/or diagnosis of SARS-CoV-2 by FDA under an Emergency Use Authorization (EUA). This EUA will remain  in effect (meaning this test can be used) for the duration of the COVID-19 declaration under Section 564(b)(1) of the Act, 21 U.S.C.section 360bbb-3(b)(1), unless the authorization is terminated  or revoked sooner.       Influenza A by PCR NEGATIVE NEGATIVE   Influenza B by PCR NEGATIVE NEGATIVE    Comment: (NOTE) The Xpert Xpress SARS-CoV-2/FLU/RSV plus assay is intended as an aid in the diagnosis of influenza from Nasopharyngeal swab specimens and should not be used as a sole basis for treatment. Nasal washings and aspirates are unacceptable for Xpert Xpress SARS-CoV-2/FLU/RSV testing.  Fact Sheet for Patients: BloggerCourse.com  Fact Sheet for Healthcare Providers: SeriousBroker.it  This test is not yet approved or cleared by the [place] FDA and has been authorized for detection and/or diagnosis of SARS-CoV-2 by FDA under an Emergency Use Authorization (EUA). This EUA will remain in effect (meaning this test can be used) for the duration of the COVID-19 declaration under Section 564(b)(1) of the Act, 21 U.S.C. section 360bbb-3(b)(1), unless the authorization is terminated or revoked.  Performed at [facility], [address] [place], [place] [address]   CBG monitoring, ED     Status: Abnormal   Collection Time: 05/03/21  1:28 PM  Result Value Ref Range   Glucose-Capillary 276 (H) 70 - 99 mg/dL    Comment: Glucose reference range applies only to samples taken after  fasting for at least 8 hours.  Lactic acid, plasma     Status: None   Collection Time: 05/03/21  2:52 PM  Result Value Ref Range   Lactic Acid, Venous 0.9 0.5 - 1.9 mmol/L    Comment:  Performed at [facility], [address] [place], [place] [address]  Protime-INR     Status: None   Collection Time: 05/03/21  2:52 PM  Result Value Ref Range   Prothrombin Time 14.9 11.4 - 15.2 seconds   INR 1.2 0.8 - 1.2    Comment: (NOTE) INR goal varies based on device and disease states. Performed at [facility], [address] [place], [place] [address]   APTT     Status: None   Collection Time: 05/03/21  2:52 PM  Result Value Ref Range   aPTT 30 24 - 36 seconds    Comment: Performed at [facility], [address] [place], [place] [address]  Hemoglobin A1c     Status: Abnormal   Collection Time: 05/03/21  2:52 PM  Result Value Ref Range   Hgb A1c MFr Bld 12.3 (H) 4.8 - 5.6 %    Comment: (NOTE)         Prediabetes: 5.7 - 6.4         Diabetes: >6.4         Glycemic control for adults with diabetes: <7.0    Mean Plasma Glucose 306 mg/dL    Comment: (NOTE) Performed At: [facility] [address] [place], [place] [id number] Jolene Schimke MD OZ:[id number]   C-reactive protein     Status: Abnormal   Collection Time: 05/03/21  2:52 PM  Result Value Ref Range   CRP 24.2 (H) <1.0 mg/dL    Comment: Performed at [facility], [address], [place], [place] [address]  Glucose, capillary     Status: Abnormal   Collection Time: 05/03/21  4:08 PM  Result Value Ref Range   Glucose-Capillary 270 (H) 70 - 99 mg/dL    Comment: Glucose reference range applies only to samples taken after fasting for at least 8 hours.   Comment 1 Notify RN    Comment 2 Document in Chart   Glucose, capillary     Status: Abnormal   Collection Time: 05/03/21  9:48 PM  Result Value Ref Range   Glucose-Capillary 228 (H) 70 - 99 mg/dL    Comment: Glucose reference range  applies only to samples taken after fasting for at least 8 hours.   Comment 1 Notify RN   Basic metabolic panel     Status: Abnormal   Collection Time: 05/04/21  6:04 AM  Result Value Ref Range   Sodium 133 (L) 135 - 145 mmol/L   Potassium 4.5 3.5 - 5.1 mmol/L   Chloride 105 98 - 111 mmol/L   CO2 19 (L) 22 - 32 mmol/L   Glucose, Bld 182 (H) 70 - 99 mg/dL    Comment: Glucose reference range applies only to samples taken after fasting for at least 8 hours.   BUN 25 (H) 6 - 20 mg/dL   Creatinine, Ser 8.41 0.61 - 1.24 mg/dL   Calcium 8.4 (L) 8.9 - 10.3 mg/dL   GFR, Estimated >32 >44 mL/min    Comment: (NOTE) Calculated using the CKD-EPI Creatinine Equation (2021)    Anion gap 9 5 - 15    Comment: Performed at [facility], [address] [place], [place] [address]  CBC     Status: Abnormal   Collection Time: 05/04/21  6:04 AM  Result Value Ref Range   WBC 19.3 (H) 4.0 - 10.5 K/uL   RBC 3.64 (L) 4.22 - 5.81 MIL/uL   Hemoglobin 12.3 (L) 13.0 - 17.0 g/dL  HCT 36.1 (L) 39.0 - 52.0 %   MCV 99.2 80.0 - 100.0 fL   MCH 33.8 26.0 - 34.0 pg   MCHC 34.1 30.0 - 36.0 g/dL   RDW 73.7 10.6 - 26.9 %   Platelets 393 150 - 400 K/uL   nRBC 0.0 0.0 - 0.2 %    Comment: Performed at [facility], [address] [place], [place] [address]  Glucose, capillary     Status: Abnormal   Collection Time: 05/04/21  7:41 AM  Result Value Ref Range   Glucose-Capillary 185 (H) 70 - 99 mg/dL    Comment: Glucose reference range applies only to samples taken after fasting for at least 8 hours.  Glucose, capillary     Status: Abnormal   Collection Time: 05/04/21 12:06 PM  Result Value Ref Range   Glucose-Capillary 208 (H) 70 - 99 mg/dL    Comment: Glucose reference range applies only to samples taken after fasting for at least 8 hours.  Glucose, capillary     Status: Abnormal   Collection Time: 05/04/21  4:46 PM  Result Value Ref Range   Glucose-Capillary 166 (H) 70 - 99 mg/dL     Comment: Glucose reference range applies only to samples taken after fasting for at least 8 hours.    MR FOOT RIGHT W WO CONTRAST  Result Date: 05/03/2021 CLINICAL DATA:  Wound at the base of the great toe after stepping on an object. Redness and swelling. EXAM: MRI OF THE RIGHT FOREFOOT WITHOUT AND WITH CONTRAST TECHNIQUE: Multiplanar, multisequence MR imaging of the right foot was performed before and after the administration of intravenous contrast. CONTRAST:  76mL GADAVIST GADOBUTROL 1 MMOL/ML IV SOLN COMPARISON:  Radiographs, same date. FINDINGS: Diffuse subcutaneous soft tissue swelling/edema/fluid suggesting cellulitis. No discrete rim enhancing fluid collection to suggest a drainable abscess. Open wound noted on the plantar and medial aspect of the great toe but no obvious foreign body or abscess. Diffuse myofasciitis without findings to suggest pyomyositis. No MR findings suspicious for septic arthritis or osteomyelitis. IMPRESSION: 1. Diffuse subcutaneous soft tissue swelling/edema/fluid suggesting cellulitis. No discrete rim enhancing fluid collection to suggest a drainable abscess. 2. Diffuse myofasciitis without findings to suggest pyomyositis. 3. No MR findings suspicious for septic arthritis or osteomyelitis. Electronically Signed   By: Rudie Meyer M.D.   On: 05/03/2021 16:15   DG Foot Complete Right  Result Date: 05/03/2021 CLINICAL DATA:  Diabetic with foot infection. EXAM: RIGHT FOOT COMPLETE - 3+ VIEW COMPARISON:  None. FINDINGS: Soft tissue swelling of the forefoot. No evidence of fracture. No radiographic sign of osteomyelitis. Regional arterial calcification is noted. IMPRESSION: Swelling of the forefoot.  No radiographic sign of osteomyelitis. Electronically Signed   By: Paulina Fusi M.D.   On: 05/03/2021 10:38    Review of Systems  Constitutional:  Positive for fever.  Skin:        Ulcer right foot with cellulitis  All other systems reviewed and are negative. Blood pressure  (!) 155/94, pulse 87, temperature 98.7 F (37.1 C), resp. rate 16, height 5\' 6"  (1.676 m), weight 68.9 kg, SpO2 99 %.  Vitals:   05/04/21 1207 05/04/21 1522  BP: (!) 143/91 (!) 155/94  Pulse: 87 87  Resp: 15 16  Temp: 98.3 F (36.8 C) 98.7 F (37.1 C)  SpO2: 100% 99%    General AA&O x3. Normal mood and affect.  Vascular Dorsalis pedis and posterior tibial pulses  present 2+ bilaterally  Capillary refill normal to all  digits. Pedal hair growth normal.  Neurologic Epicritic sensation grossly absent.  Dermatologic (Wound) Draining wound around the first MTPJ and plantar medial and lateral hallux with multiple areas of necrosis most significantly in the first interspace and medial IPJ.  Performed extensive debridement excisionally today and the medial area went quite deep.  I did dissect into the interspace as well.  He did not require anesthesia.  Not much purulence was found but watery drainage  Orthopedic: Motor intact BLE.    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 -Despite the MRI findings he has severe amounts of skin and myonecrosis around the wounds -Antibiotics: He is on cefepime and Vanco.  And covering for anaerobes with Flagyl may be advised at this point to still.  WBC 19 today.  I have ordered a new WBC for the morning. -Recommend infectious disease consult for severe diabetic foot infection -WB Status: WBAT for now in surgical shoe -Wound Care: I will change his dressing again tomorrow to reevaluate.  I packed it with iodoform gauze today.  Expect he will be here at least another 48 hours.  If wound does not appear much better tomorrow, decrease in WBC and/or cellulitis I think maybe operative debridement on Sunday would be best. -  [name] 05/04/2021, 6:47 PM   Best available via secure chat for questions or  concerns.

## 2021-05-04 NOTE — Plan of Care (Signed)

## 2021-05-05 LAB — URINALYSIS, COMPLETE (UACMP) WITH MICROSCOPIC
Bilirubin Urine: NEGATIVE
Glucose, UA: >=500 mg/dL — AB
Ketones, ur: 5 mg/dL — AB
Leukocytes,Ua: NEGATIVE
Nitrite: NEGATIVE
Protein, ur: 100 mg/dL — AB
Specific Gravity, Urine: 1.025 (ref 1.005–1.030)
Squamous Epithelial / HPF: NONE SEEN (ref 0–5)
pH: 5 (ref 5.0–8.0)

## 2021-05-05 LAB — CBC
HCT: 34.1 % — ABNORMAL LOW (ref 39.0–52.0)
HCT: 35.7 % — ABNORMAL LOW (ref 39.0–52.0)
Hemoglobin: 11.9 g/dL — ABNORMAL LOW (ref 13.0–17.0)
Hemoglobin: 12.5 g/dL — ABNORMAL LOW (ref 13.0–17.0)
MCH: 34.7 pg — ABNORMAL HIGH (ref 26.0–34.0)
MCH: 34.3 pg — ABNORMAL HIGH (ref 26.0–34.0)
MCHC: 34.9 g/dL (ref 30.0–36.0)
MCHC: 35 g/dL (ref 30.0–36.0)
MCV: 99.4 fL (ref 80.0–100.0)
MCV: 98.1 fL (ref 80.0–100.0)
Platelets: 388 10*3/uL (ref 150–400)
Platelets: 421 10*3/uL — ABNORMAL HIGH (ref 150–400)
RBC: 3.43 MIL/uL — ABNORMAL LOW (ref 4.22–5.81)
RBC: 3.64 MIL/uL — ABNORMAL LOW (ref 4.22–5.81)
RDW: 11.6 % (ref 11.5–15.5)
RDW: 11.4 % — ABNORMAL LOW (ref 11.5–15.5)
WBC: 21.8 10*3/uL — ABNORMAL HIGH (ref 4.0–10.5)
WBC: 18.9 10*3/uL — ABNORMAL HIGH (ref 4.0–10.5)
nRBC: 0 % (ref 0.0–0.2)
nRBC: 0 % (ref 0.0–0.2)

## 2021-05-05 LAB — GLUCOSE, CAPILLARY
Glucose-Capillary: 76 mg/dL (ref 70–99)
Glucose-Capillary: 208 mg/dL — ABNORMAL HIGH (ref 70–99)
Glucose-Capillary: 137 mg/dL — ABNORMAL HIGH (ref 70–99)
Glucose-Capillary: 224 mg/dL — ABNORMAL HIGH (ref 70–99)
Glucose-Capillary: 193 mg/dL — ABNORMAL HIGH (ref 70–99)

## 2021-05-05 LAB — CREATININE, SERUM
Creatinine, Ser: 0.82 mg/dL (ref 0.61–1.24)
GFR, Estimated: >60 mL/min (ref >60)

## 2021-05-05 MED ORDER — VANCOMYCIN HCL IN DEXTROSE 1-5 GM/200ML-% IV SOLN
1000.0000 mg | Freq: Two times a day (BID) | INTRAVENOUS | Status: DC
  Administered 2021-05-05 – 2021-05-07 (×5): 1000 mg via INTRAVENOUS
  Filled 2021-05-05 (×6): qty 200

## 2021-05-05 MED ORDER — PIPERACILLIN-TAZOBACTAM 3.375 G IVPB 30 MIN: 3.3750 g | Freq: Four times a day (QID) | INTRAVENOUS | Status: DC

## 2021-05-05 MED ORDER — PIPERACILLIN-TAZOBACTAM 3.375 G IVPB
3.3750 g | Freq: Three times a day (TID) | INTRAVENOUS | Status: DC
  Administered 2021-05-05 – 2021-05-08 (×11): 3.375 g via INTRAVENOUS
  Filled 2021-05-05 (×11): qty 50

## 2021-05-05 NOTE — Plan of Care (Signed)

## 2021-05-05 NOTE — Progress Notes (Signed)
[facility] [place] NAME: Jesus Flowers    MR#:  [id number]  DATE OF BIRTH:  May 13, 1966  SUBJECTIVE:  patient here with right great toe infection after injury at work. Denies any pain at present.  REVIEW OF SYSTEMS:   Review of Systems  Constitutional:  Negative for chills, fever and weight loss.  HENT:  Negative for ear discharge, ear pain and nosebleeds.   Eyes:  Negative for blurred vision, pain and discharge.  Respiratory:  Negative for sputum production, shortness of breath, wheezing and stridor.   Cardiovascular:  Negative for chest pain, palpitations, orthopnea and PND.  Gastrointestinal:  Negative for abdominal pain, diarrhea, nausea and vomiting.  Genitourinary:  Negative for frequency and urgency.  Musculoskeletal:  Negative for back pain and joint pain.  Neurological:  Positive for weakness. Negative for sensory change, speech change and focal weakness.  Psychiatric/Behavioral:  Negative for depression and hallucinations. The patient is not nervous/anxious.   Tolerating Diet:yes Tolerating PT:   DRUG ALLERGIES:  No Known Allergies  VITALS:  Blood pressure (!) 155/88, pulse 90, temperature 98.1 F (36.7 C), temperature source Oral, resp. rate 18, height 5' 6"  (1.676 m), weight 68.9 kg, SpO2 99 %.  PHYSICAL EXAMINATION:   Physical Exam  GENERAL:  55 y.o.-year-old patient lying in the bed with no acute distress.  HEENT: Head atraumatic, normocephalic. Oropharynx and nasopharynx clear.  NECK:  Supple, no jugular venous distention. No thyroid enlargement, no tenderness.  LUNGS: Normal breath sounds bilaterally, no wheezing, rales, rhonchi. No use of accessory muscles of respiration.  CARDIOVASCULAR: S1, S2 normal. No murmurs, rubs, or gallops.  ABDOMEN: Soft, nontender, nondistended. Bowel sounds present. No organomegaly or mass.  EXTREMITIES: No cyanosis, clubbing or edema b/l.    NEUROLOGIC: ++ sensory deficits b/l.  Rest  nonfocal PSYCHIATRIC:  patient is alert and oriented x 3.  SKIN: No obvious rash, lesion, or ulcer.   LABORATORY PANEL:  CBC Recent Labs  Lab 05/05/21 0457  WBC 21.8*  HGB 11.9*  HCT 34.1*  PLT 388     Chemistries  Recent Labs  Lab 05/03/21 0949 05/04/21 0604 05/05/21 0457  NA 132* 133*  --   K 4.5 4.5  --   CL 97* 105  --   CO2 20* 19*  --   GLUCOSE 393* 182*  --   BUN 31* 25*  --   CREATININE 1.30* 0.97 0.82  CALCIUM 9.1 8.4*  --   AST 15  --   --   ALT 18  --   --   ALKPHOS 189*  --   --   BILITOT 1.1  --   --     Cardiac Enzymes No results for input(s): TROPONINI in the last 168 hours. RADIOLOGY:  MR FOOT RIGHT W WO CONTRAST  Result Date: 05/03/2021 CLINICAL DATA:  Wound at the base of the great toe after stepping on an object. Redness and swelling. EXAM: MRI OF THE RIGHT FOREFOOT WITHOUT AND WITH CONTRAST TECHNIQUE: Multiplanar, multisequence MR imaging of the right foot was performed before and after the administration of intravenous contrast. CONTRAST:  42m GADAVIST GADOBUTROL 1 MMOL/ML IV SOLN COMPARISON:  Radiographs, same date. FINDINGS: Diffuse subcutaneous soft tissue swelling/edema/fluid suggesting cellulitis. No discrete rim enhancing fluid collection to suggest a drainable abscess. Open wound noted on the plantar and medial aspect of the great toe but no obvious foreign body or abscess. Diffuse myofasciitis without findings to suggest pyomyositis. No MR  findings suspicious for septic arthritis or osteomyelitis. IMPRESSION: 1. Diffuse subcutaneous soft tissue swelling/edema/fluid suggesting cellulitis. No discrete rim enhancing fluid collection to suggest a drainable abscess. 2. Diffuse myofasciitis without findings to suggest pyomyositis. 3. No MR findings suspicious for septic arthritis or osteomyelitis. Electronically Signed   By: Marijo Sanes M.D.   On: 05/03/2021 16:15   ASSESSMENT AND PLAN:    Jesus Flowers is a 55 y.o. male with medical history  significant of hypertension, hyperlipidemia, diabetes mellitus, depression, who presents with right foot wound infection.  Pt states that he he stepped on something at work 1 week ago. He did not think it was a big deal. He developed pain and swelling in right foot over the past week.  Sepsis due to right foot wound infection: Patient meets criteria for sepsis with WBC 22.2, tachycardia with heart rate of 129.  Also has fever 100.3.  Lactic acid is normal.  Currently hemodynamically stable.    - Empiric antimicrobial treatment with vancomycin and zosyn - PRN Zofran for nausea, morphine and Percocet for pain - Blood cultures x 2 so far neg - ESR and CRP elevated -received IVF--d/c now --MRI foot noted--no abscess or fluid collection. S/o cellulitis --Dr Sherryle Lis to see pt--s/p debridement at bedside --10/8--lot of drainage noted on the dressing (bloody). WBC increased to 21K. await podiatry rec for ?OR debridement   HTN: Patient not taking medications currently.  Blood pressure 132/88. -prn hydralazine   High cholesterol: Patient is not taking medications currently -f/u with PCP   Diabetes mellitus without complication ([place]): No B5A on record.  Blood sugar 393, anion gap 15.  Looks like poorly controlled.  Patient is taking Jardiance at home -Start Lantus 5 units daily while inhouse --Resume Jardiance,Janumet -Sliding scale insulin   Depression -Prozac   AKI (acute kidney injury) ([place]): Creatinine 1.0, BUN 31, no baseline creatinine available -IV fluid as above--d/c  -Avoid using renal toxic medications --creat 0.8       DVT ppx: SQ Heparin      Code Status: Full code Family Communication: none today Disposition Plan:  Anticipate discharge back to previous environment Consults called:  Dr. Sherryle Lis podiatry Admission status and  Level of care: Med-Surg:      as inpt           Status is: Inpatient   Remains inpatient appropriate because:Inpatient level of care appropriate  due to severity of illness   Dispo: The patient is from: Home              Anticipated d/c is to: Home              Patient currently is not medically stable to d/c.              Difficult to place patient No       TOTAL TIME TAKING CARE OF THIS PATIENT: 25 minutes.  >50% time spent on counselling and coordination of care  Note: This dictation was prepared with Dragon dictation along with smaller phrase technology. Any transcriptional errors that result from this process are unintentional.  Fritzi Mandes M.D    [facility]   CC: Primary care physician; Pcp, No Patient ID: Jesus Flowers, male   DOB: Aug 12, 1965, 55 y.o.   MRN: [id number]

## 2021-05-05 NOTE — Consult Note (Signed)
Pharmacy Antibiotic Note  Jesus Flowers is a 55 y.o. male admitted on 05/03/2021 with right foot wound infection. X-ray of right foot is negative for osteomyelitis. Pharmacy has been consulted for vancomycin and zosyn dosing.  Plan: Scr improved 1.30>>0.97 >0.82 -Will adjust Vancomycin 750 mg q12h to 1000mg  q12h Goal AUC 400-550  Est AUC: 500 Est Cmax: 32.5 Est Cmin: 13.4 Calculated with SCr 0.82, Vd 0.72  Stopping Cefepime and starting Zosyn 3.375g q8 hours extended infusion  Monitor clinical picture, renal function, and vancomycin levels at steady state F/U C&S, abx deescalation / LOT   Height: 5\' 6"  (167.6 cm) Weight: 68.9 kg (152 lb) IBW/kg (Calculated) : 63.8  Temp (24hrs), Avg:98.9 F (37.2 C), Min:98.3 F (36.8 C), Max:100 F (37.8 C)  Recent Labs  Lab 05/03/21 0949 05/03/21 1452 05/04/21 0604 05/05/21 0457  WBC 22.2*  --  19.3* 21.8*  CREATININE 1.30*  --  0.97 0.82  LATICACIDVEN 1.9 0.9  --   --      Estimated Creatinine Clearance: 91.9 mL/min (by C-G formula based on SCr of 0.82 mg/dL).    No Known Allergies  Antimicrobials this admission: 10/6 ceftriaxone x1 10/6 vancomycin >>  10/6 cefepime >> 10/8 10/8 zosyn >>  Dose adjustments this admission: 10/7:  Cefepime from 2 gm q12h to q8h  Vanc from 1250mg  IV q24h to 750mg  q12h 10/8 Vanc from 750mg  q12 to 1000mg  q12  Microbiology results: 10/6 BCx: NG 2 days 10/6 UCx: sent   10/7 f/u I&D cx pending  Thank you for allowing pharmacy to be a part of this patient's care.  12/8, PharmD, BCPS Clinical Pharmacist 05/05/2021 7:27 AM

## 2021-05-05 NOTE — Progress Notes (Signed)
  Subjective:  Patient ID: Jesus Flowers, male    DOB: 25-Mar-1966,  MRN: [id number]  Doing well this AM, says he actually feels a bit better and pain improving.   Negative for chest pain and shortness of breath Fever: no Night sweats: no Constitutional signs: no Objective:   Vitals:   05/05/21 0526 05/05/21 0905  BP: (!) 156/90 (!) 155/88  Pulse: 85 90  Resp: 18 18  Temp: 98.6 F (37 C) 98.1 F (36.7 C)  SpO2: 100% 99%   General AA&O x3. Normal mood and affect.  Vascular Dorsalis pedis and posterior tibial pulses 2/4 bilat. Brisk capillary refill to all digits. Pedal hair present.  Neurologic Epicritic sensation grossly intact.  Dermatologic Some purulence from inferior wound today, minimal amount. Improved cellulitis, no new necrosis since yesterday    Orthopedic: MMT 5/5 in dorsiflexion, plantarflexion, inversion, and eversion. Normal joint ROM without pain or crepitus.   WBC up today to 21.8 from 19.3  Assessment & Plan:  Patient was evaluated and treated and all questions answered.  Diabetic foot infection -Continue trending WBC -Recommend ID consult -Culture with no growth yet, too early to tell -Dressing change today, I will re-evaluate again tomorrow -If his WBC continues to increase may need to take to OR for more thorough exploration and washout  [name], DPM  Accessible via secure chat for questions or concerns.

## 2021-05-06 ENCOUNTER — Encounter
Admission: EM | Disposition: A | Discharge status: Home / Self Care | Payer: Self-pay | Source: Home / Self Care | Attending: Internal Medicine

## 2021-05-06 ENCOUNTER — Inpatient Hospital Stay: Payer: Self-pay | Admitting: Anesthesiology

## 2021-05-06 DIAGNOSIS — I10 Essential (primary) hypertension: Secondary | ICD-10-CM

## 2021-05-06 HISTORY — PX: IRRIGATION AND DEBRIDEMENT FOOT: SHX6602

## 2021-05-06 LAB — BASIC METABOLIC PANEL
Anion gap: 6 (ref 5–15)
BUN: 17 mg/dL (ref 6–20)
CO2: 23 mmol/L (ref 22–32)
Calcium: 8.6 mg/dL — ABNORMAL LOW (ref 8.9–10.3)
Chloride: 104 mmol/L (ref 98–111)
Creatinine, Ser: 0.87 mg/dL (ref 0.61–1.24)
GFR, Estimated: >60 mL/min (ref >60)
Glucose, Bld: 150 mg/dL — ABNORMAL HIGH (ref 70–99)
Potassium: 4.1 mmol/L (ref 3.5–5.1)
Sodium: 133 mmol/L — ABNORMAL LOW (ref 135–145)

## 2021-05-06 LAB — CBC
HCT: 33.3 % — ABNORMAL LOW (ref 39.0–52.0)
Hemoglobin: 11.8 g/dL — ABNORMAL LOW (ref 13.0–17.0)
MCH: 34.9 pg — ABNORMAL HIGH (ref 26.0–34.0)
MCHC: 35.4 g/dL (ref 30.0–36.0)
MCV: 98.5 fL (ref 80.0–100.0)
Platelets: 362 10*3/uL (ref 150–400)
RBC: 3.38 MIL/uL — ABNORMAL LOW (ref 4.22–5.81)
RDW: 11.5 % (ref 11.5–15.5)
WBC: 17 10*3/uL — ABNORMAL HIGH (ref 4.0–10.5)
nRBC: 0 % (ref 0.0–0.2)

## 2021-05-06 LAB — GLUCOSE, CAPILLARY
Glucose-Capillary: 151 mg/dL — ABNORMAL HIGH (ref 70–99)
Glucose-Capillary: 137 mg/dL — ABNORMAL HIGH (ref 70–99)
Glucose-Capillary: 112 mg/dL — ABNORMAL HIGH (ref 70–99)
Glucose-Capillary: 185 mg/dL — ABNORMAL HIGH (ref 70–99)
Glucose-Capillary: 167 mg/dL — ABNORMAL HIGH (ref 70–99)

## 2021-05-06 SURGERY — IRRIGATION AND DEBRIDEMENT FOOT
Anesthesia: General | Laterality: Right

## 2021-05-06 MED ORDER — ONDANSETRON HCL 4 MG/2ML IJ SOLN
INTRAMUSCULAR | Status: AC
  Filled 2021-05-06: qty 2

## 2021-05-06 MED ORDER — FENTANYL CITRATE (PF) 100 MCG/2ML IJ SOLN
INTRAMUSCULAR | Status: AC
  Filled 2021-05-06: qty 2

## 2021-05-06 MED ORDER — SODIUM CHLORIDE 0.9 % IV SOLN: INTRAVENOUS | Status: DC | PRN

## 2021-05-06 MED ORDER — BUPIVACAINE HCL 0.5 % IJ SOLN
INTRAMUSCULAR | Status: DC | PRN
  Administered 2021-05-06: 10 mL

## 2021-05-06 MED ORDER — PHENYLEPHRINE HCL (PRESSORS) 10 MG/ML IV SOLN
INTRAVENOUS | Status: AC
  Filled 2021-05-06: qty 2

## 2021-05-06 MED ORDER — FENTANYL CITRATE (PF) 100 MCG/2ML IJ SOLN: 25.0000 ug | INTRAMUSCULAR | Status: DC | PRN

## 2021-05-06 MED ORDER — FENTANYL CITRATE (PF) 100 MCG/2ML IJ SOLN
INTRAMUSCULAR | Status: DC | PRN
  Administered 2021-05-06 (×2): 25 ug via INTRAVENOUS

## 2021-05-06 MED ORDER — PROPOFOL 500 MG/50ML IV EMUL
INTRAVENOUS | Status: AC
  Filled 2021-05-06: qty 50

## 2021-05-06 MED ORDER — PROMETHAZINE HCL 25 MG/ML IJ SOLN: 6.2500 mg | INTRAMUSCULAR | Status: DC | PRN

## 2021-05-06 MED ORDER — MIDAZOLAM HCL 2 MG/2ML IJ SOLN
INTRAMUSCULAR | Status: DC | PRN
  Administered 2021-05-06: 2 mg via INTRAVENOUS

## 2021-05-06 MED ORDER — COLLAGENASE 250 UNIT/GM EX OINT
TOPICAL_OINTMENT | Freq: Two times a day (BID) | CUTANEOUS | Status: DC
  Administered 2021-05-07: 1 via TOPICAL
  Filled 2021-05-06: qty 30

## 2021-05-06 MED ORDER — PROPOFOL 500 MG/50ML IV EMUL
INTRAVENOUS | Status: DC | PRN
  Administered 2021-05-06: 50 ug/kg/min via INTRAVENOUS

## 2021-05-06 MED ORDER — LIDOCAINE HCL (CARDIAC) PF 100 MG/5ML IV SOSY
PREFILLED_SYRINGE | INTRAVENOUS | Status: DC | PRN
  Administered 2021-05-06: 80 mg via INTRAVENOUS

## 2021-05-06 MED ORDER — DEXMEDETOMIDINE (PRECEDEX) IN NS 20 MCG/5ML (4 MCG/ML) IV SYRINGE
PREFILLED_SYRINGE | INTRAVENOUS | Status: DC | PRN
  Administered 2021-05-06: 12 ug via INTRAVENOUS

## 2021-05-06 MED ORDER — OXYCODONE-ACETAMINOPHEN 5-325 MG PO TABS
1.0000 | ORAL_TABLET | ORAL | Status: DC | PRN
Start: 2021-05-06 — End: 2021-05-11
  Administered 2021-05-06: 1 via ORAL
  Administered 2021-05-06 – 2021-05-11 (×17): 2 via ORAL
  Filled 2021-05-06 (×2): qty 2
  Filled 2021-05-06: qty 1
  Filled 2021-05-06 (×16): qty 2

## 2021-05-06 MED ORDER — PHENYLEPHRINE HCL (PRESSORS) 10 MG/ML IV SOLN
INTRAVENOUS | Status: DC | PRN
  Administered 2021-05-06: 100 ug via INTRAVENOUS

## 2021-05-06 MED ORDER — VANCOMYCIN HCL 1000 MG IV SOLR
INTRAVENOUS | Status: AC
  Filled 2021-05-06: qty 20

## 2021-05-06 MED ORDER — MIDAZOLAM HCL 2 MG/2ML IJ SOLN
INTRAMUSCULAR | Status: AC
  Filled 2021-05-06: qty 2

## 2021-05-06 MED ORDER — VANCOMYCIN HCL 1 G IV SOLR
INTRAVENOUS | Status: DC | PRN
  Administered 2021-05-06: 1000 mg via TOPICAL

## 2021-05-06 MED ORDER — HYDROCODONE-ACETAMINOPHEN 7.5-325 MG PO TABS
1.0000 | ORAL_TABLET | Freq: Once | ORAL | Status: DC | PRN
Start: 2021-05-06 — End: 2021-05-06

## 2021-05-06 MED ORDER — ONDANSETRON HCL 4 MG/2ML IJ SOLN
INTRAMUSCULAR | Status: DC | PRN
  Administered 2021-05-06: 4 mg via INTRAVENOUS

## 2021-05-06 SURGICAL SUPPLY — 62 items
BLADE OSC/SAGITTAL MD 5.5X18 (BLADE) IMPLANT
BLADE OSCILLATING/SAGITTAL (BLADE)
BLADE SURG 15 STRL LF DISP TIS (BLADE) ×1 IMPLANT
BLADE SURG 15 STRL SS (BLADE) ×2
BLADE SW THK.38XMED LNG THN (BLADE) IMPLANT
BNDG COHESIVE 4X5 TAN ST LF (GAUZE/BANDAGES/DRESSINGS) IMPLANT
BNDG COHESIVE 6X5 TAN ST LF (GAUZE/BANDAGES/DRESSINGS) IMPLANT
BNDG CONFORM 2 STRL LF (GAUZE/BANDAGES/DRESSINGS) IMPLANT
BNDG CONFORM 3 STRL LF (GAUZE/BANDAGES/DRESSINGS) IMPLANT
BNDG ELASTIC 4X5.8 VLCR STR LF (GAUZE/BANDAGES/DRESSINGS) ×2 IMPLANT
BNDG ESMARK 4X12 TAN STRL LF (GAUZE/BANDAGES/DRESSINGS) ×2 IMPLANT
BNDG GAUZE ELAST 4 BULKY (GAUZE/BANDAGES/DRESSINGS) IMPLANT
CANISTER WOUND CARE 500ML ATS (WOUND CARE) ×2 IMPLANT
CUFF TOURN SGL QUICK 12 (TOURNIQUET CUFF) IMPLANT
CUFF TOURN SGL QUICK 18X4 (TOURNIQUET CUFF) IMPLANT
DRAPE FLUOR MINI C-ARM 54X84 (DRAPES) IMPLANT
DRAPE XRAY CASSETTE 23X24 (DRAPES) IMPLANT
DURAPREP 26ML APPLICATOR (WOUND CARE) IMPLANT
ELECT REM PT RETURN 9FT ADLT (ELECTROSURGICAL)
ELECTRODE REM PT RTRN 9FT ADLT (ELECTROSURGICAL) IMPLANT
GAUZE 4X4 16PLY ~~LOC~~+RFID DBL (SPONGE) ×2 IMPLANT
GAUZE PACKING 1/2X5YD (GAUZE/BANDAGES/DRESSINGS) ×2 IMPLANT
GAUZE PACKING 1/4 X5 YD (GAUZE/BANDAGES/DRESSINGS) IMPLANT
GAUZE PACKING IODOFORM 1X5 (PACKING) IMPLANT
GAUZE SPONGE 4X4 12PLY STRL (GAUZE/BANDAGES/DRESSINGS) ×2 IMPLANT
GAUZE XEROFORM 1X8 LF (GAUZE/BANDAGES/DRESSINGS) IMPLANT
GLOVE SURG ENC MOIS LTX SZ7 (GLOVE) ×4 IMPLANT
GOWN STRL REUS W/ TWL XL LVL3 (GOWN DISPOSABLE) ×2 IMPLANT
GOWN STRL REUS W/TWL MED LVL3 (GOWN DISPOSABLE) IMPLANT
GOWN STRL REUS W/TWL XL LVL3 (GOWN DISPOSABLE) ×4
HANDPIECE VERSAJET DEBRIDEMENT (MISCELLANEOUS) IMPLANT
IV NS 1000ML (IV SOLUTION) ×2
IV NS 1000ML BAXH (IV SOLUTION) ×1 IMPLANT
KIT DRSG VAC SLVR GRANUFM (MISCELLANEOUS) IMPLANT
KIT TURNOVER KIT A (KITS) ×2 IMPLANT
LABEL OR SOLS (LABEL) ×2 IMPLANT
MANIFOLD NEPTUNE II (INSTRUMENTS) ×2 IMPLANT
NEEDLE FILTER BLUNT 18X 1/2SAF (NEEDLE) ×1
NEEDLE FILTER BLUNT 18X1 1/2 (NEEDLE) ×1 IMPLANT
NEEDLE HYPO 25X1 1.5 SAFETY (NEEDLE) ×2 IMPLANT
NS IRRIG 500ML POUR BTL (IV SOLUTION) ×2 IMPLANT
PACK EXTREMITY ARMC (MISCELLANEOUS) ×2 IMPLANT
PAD ABD DERMACEA PRESS 5X9 (GAUZE/BANDAGES/DRESSINGS) ×4 IMPLANT
PENCIL ELECTRO HAND CTR (MISCELLANEOUS) ×2 IMPLANT
PULSAVAC PLUS IRRIG FAN TIP (DISPOSABLE)
RASP SM TEAR CROSS CUT (RASP) IMPLANT
SHIELD FULL FACE ANTIFOG 7M (MISCELLANEOUS) IMPLANT
STOCKINETTE IMPERVIOUS 9X36 MD (GAUZE/BANDAGES/DRESSINGS) ×2 IMPLANT
SUT ETHILON 2 0 FS 18 (SUTURE) ×4 IMPLANT
SUT ETHILON 4-0 (SUTURE) ×2
SUT ETHILON 4-0 FS2 18XMFL BLK (SUTURE) ×1
SUT NYLON 2-0 (SUTURE) ×2 IMPLANT
SUT VIC AB 3-0 SH 27 (SUTURE) ×2
SUT VIC AB 3-0 SH 27X BRD (SUTURE) ×1 IMPLANT
SUT VIC AB 4-0 FS2 27 (SUTURE) ×2 IMPLANT
SUTURE ETHLN 4-0 FS2 18XMF BLK (SUTURE) ×1 IMPLANT
SWAB CULTURE AMIES ANAERIB BLU (MISCELLANEOUS) IMPLANT
SYR 10ML LL (SYRINGE) ×2 IMPLANT
SYR 3ML LL SCALE MARK (SYRINGE) ×2 IMPLANT
TIP FAN IRRIG PULSAVAC PLUS (DISPOSABLE) IMPLANT
UNDERPAD 30X36 HEAVY ABSORB (UNDERPADS AND DIAPERS) IMPLANT
WATER STERILE IRR 500ML POUR (IV SOLUTION) ×2 IMPLANT

## 2021-05-06 NOTE — Progress Notes (Signed)
Upon  discharging from PACU, rt foot noted to having active bleeding through ace wrap and pooling in postop shoe.  Call made to Dr. Lilian Kapur. Order given to reinforce w/4x4's, abd pad and rewrap with ace bandage.  Reinforcement done, ice pack applied and extra elevation w/pillows done for RLE. Pt cont to deny any pain.

## 2021-05-06 NOTE — Interval H&P Note (Signed)
History and Physical Interval Note:  05/06/2021 8:05 AM  Jesus Flowers  has presented today for surgery, with the diagnosis of infection.  The various methods of treatment have been discussed with the patient and family. After consideration of risks, benefits and other options for treatment, the patient has consented to  Procedure(s): IRRIGATION AND DEBRIDEMENT FOOT (Right) as a surgical intervention.  The patient's history has been reviewed, patient examined, no change in status, stable for surgery.  I have reviewed the patient's chart and labs.  Questions were answered to the patient's satisfaction.     [name]

## 2021-05-06 NOTE — Plan of Care (Signed)
Patient sleeping between care. Aox4. Reports minimal pain overnight. NPO since midnight for surgery today. Plan of care reviewed with patient. Call bell within reach.  PLAN OF CARE ONGOING Problem: Education: Goal: Knowledge of General Education information will improve Description: Including pain rating scale, medication(s)/side effects and non-pharmacologic comfort measures Outcome: Progressing   Problem: Health Behavior/Discharge Planning: Goal: Ability to manage health-related needs will improve Outcome: Progressing   Problem: Clinical Measurements: Goal: Ability to maintain clinical measurements within normal limits will improve Outcome: Progressing Goal: Will remain free from infection Outcome: Progressing Goal: Diagnostic test results will improve Outcome: Progressing Goal: Respiratory complications will improve Outcome: Progressing Goal: Cardiovascular complication will be avoided Outcome: Progressing   Problem: Activity: Goal: Risk for activity intolerance will decrease Outcome: Progressing   Problem: Nutrition: Goal: Adequate nutrition will be maintained Outcome: Progressing   Problem: Coping: Goal: Level of anxiety will decrease Outcome: Progressing   Problem: Elimination: Goal: Will not experience complications related to bowel motility Outcome: Progressing Goal: Will not experience complications related to urinary retention Outcome: Progressing   Problem: Pain Managment: Goal: General experience of comfort will improve Outcome: Progressing   Problem: Safety: Goal: Ability to remain free from injury will improve Outcome: Progressing   Problem: Skin Integrity: Goal: Risk for impaired skin integrity will decrease Outcome: Progressing

## 2021-05-06 NOTE — Anesthesia Postprocedure Evaluation (Signed)
Anesthesia Post Note  Patient: Jesus Flowers  Procedure(s) Performed: IRRIGATION AND DEBRIDEMENT FOOT (Right)  Patient location during evaluation: PACU Anesthesia Type: General Level of consciousness: awake and alert Pain management: pain level controlled Vital Signs Assessment: post-procedure vital signs reviewed and stable Respiratory status: spontaneous breathing Cardiovascular status: blood pressure returned to baseline Anesthetic complications: no Comments: No anesthetic issues noted.   No notable events documented.   Last Vitals:  Vitals:   05/06/21 1021 05/06/21 1611  BP: (!) 141/85 (!) 152/77  Pulse: 74 95  Resp: 18 18  Temp: 36.7 C 37.6 C  SpO2: 100% 99%    Last Pain:  Vitals:   05/06/21 1617  TempSrc:   PainSc: Asleep                 Briant Cedar

## 2021-05-06 NOTE — Progress Notes (Signed)
[facility] [facility] [place] NAME: Jesus Flowers    MR#:  [id number]  DATE OF BIRTH:  1966/04/10  SUBJECTIVE:  patient here with right great toe infection after injury at work. Denies any pain at present.  NPO for I and D with wash out today.no fever REVIEW OF SYSTEMS:   Review of Systems  Constitutional:  Negative for chills, fever and weight loss.  HENT:  Negative for ear discharge, ear pain and nosebleeds.   Eyes:  Negative for blurred vision, pain and discharge.  Respiratory:  Negative for sputum production, shortness of breath, wheezing and stridor.   Cardiovascular:  Negative for chest pain, palpitations, orthopnea and PND.  Gastrointestinal:  Negative for abdominal pain, diarrhea, nausea and vomiting.  Genitourinary:  Negative for frequency and urgency.  Musculoskeletal:  Negative for back pain and joint pain.  Neurological:  Positive for weakness. Negative for sensory change, speech change and focal weakness.  Psychiatric/Behavioral:  Negative for depression and hallucinations. The patient is not nervous/anxious.   Tolerating Diet:yes Tolerating PT:   DRUG ALLERGIES:  No Known Allergies  VITALS:  Blood pressure 131/88, pulse 75, temperature 98.2 F (36.8 C), resp. rate 18, height 5' 6"  (1.676 m), weight 68.9 kg, SpO2 100 %.  PHYSICAL EXAMINATION:   Physical Exam  GENERAL:  55 y.o.-year-old patient lying in the bed with no acute distress.  HEENT: Head atraumatic, normocephalic. Oropharynx and nasopharynx clear.  NECK:  Supple, no jugular venous distention. No thyroid enlargement, no tenderness.  LUNGS: Normal breath sounds bilaterally, no wheezing, rales, rhonchi. No use of accessory muscles of respiration.  CARDIOVASCULAR: S1, S2 normal. No murmurs, rubs, or gallops.  ABDOMEN: Soft, nontender, nondistended. Bowel sounds present. No organomegaly or mass.  EXTREMITIES: No cyanosis, clubbing or edema b/l.    NEUROLOGIC: ++ sensory  deficits b/l.  Rest nonfocal PSYCHIATRIC:  patient is alert and oriented x 3.  SKIN: No obvious rash, lesion, or ulcer.   LABORATORY PANEL:  CBC Recent Labs  Lab 05/06/21 0456  WBC 17.0*  HGB 11.8*  HCT 33.3*  PLT 362     Chemistries  Recent Labs  Lab 05/03/21 0949 05/04/21 0604 05/06/21 0456  NA 132*   < > 133*  K 4.5   < > 4.1  CL 97*   < > 104  CO2 20*   < > 23  GLUCOSE 393*   < > 150*  BUN 31*   < > 17  CREATININE 1.30*   < > 0.87  CALCIUM 9.1   < > 8.6*  AST 15  --   --   ALT 18  --   --   ALKPHOS 189*  --   --   BILITOT 1.1  --   --    < > = values in this interval not displayed.    Cardiac Enzymes No results for input(s): TROPONINI in the last 168 hours. RADIOLOGY:  No results found. ASSESSMENT AND PLAN:    Jesus Flowers is a 55 y.o. male with medical history significant of hypertension, hyperlipidemia, diabetes mellitus, depression, who presents with right foot wound infection.  Pt states that he he stepped on something at work 1 week ago. He did not think it was a big deal. He developed pain and swelling in right foot over the past week.  Sepsis due to right foot wound infection: Patient meets criteria for sepsis with WBC 22.2, tachycardia with heart rate of 129.  Also  has fever 100.3.  Lactic acid is normal.  Currently hemodynamically stable.    - Empiric antimicrobial treatment with vancomycin and zosyn - PRN Zofran for nausea, morphine and Percocet for pain - Blood cultures x 2 so far neg - ESR and CRP elevated -received IVF--d/c now --MRI foot noted--no abscess or fluid collection. S/o cellulitis --Dr Sherryle Lis to see pt--s/p debridement at bedside --10/8--lot of drainage noted on the dressing (bloody). WBC increased to 21K. await podiatry rec for ?OR debridement --10/9--wbc trending down. For I and d with washout today in the OR.    HTN: Patient not taking medications currently.  Blood pressure 132/88. -prn hydralazine   Hyperlipidemia:  Patient is not taking medications currently -f/u with PCP   Diabetes mellitus without complication [facility]):   Blood sugar 393, anion gap 15.  Looks like poorly controlled.  Patient is taking Jardiance at home -Start Lantus 5 units daily while inhouse --Resume Jardiance,Janumet -Sliding scale insulin --A1c 12.3   Depression -Prozac   AKI (acute kidney injury) ([place]): Creatinine 1.0, BUN 31, no baseline creatinine available -IV fluid as above--d/c  -Avoid using renal toxic medications --creat 0.8       DVT ppx: SQ Heparin      Code Status: Full code Family Communication: none today Disposition Plan:  Anticipate discharge back to previous environment Consults called:  Dr. Sherryle Lis podiatry Admission status and  Level of care: Med-Surg:      as inpt           Status is: Inpatient   Remains inpatient appropriate because:Inpatient level of care appropriate due to severity of illness   Dispo: The patient is from: Home              Anticipated d/c is to: Home in 1-2 days              Patient currently is not medically stable to d/c.              Difficult to place patient No       TOTAL TIME TAKING CARE OF THIS PATIENT: 25 minutes.  >50% time spent on counselling and coordination of care  Note: This dictation was prepared with Dragon dictation along with smaller phrase technology. Any transcriptional errors that result from this process are unintentional.  Fritzi Mandes M.D    [facility]   CC: Primary care physician; Pcp, No Patient ID: Jesus Flowers, male   DOB: June 15, 1966, 55 y.o.   MRN: [id number]

## 2021-05-06 NOTE — Op Note (Signed)
Patient Name: Jesus Flowers DOB: [birth year]  MRN: [id number]   Date of Service: 05/03/2021 - 05/06/2021  Surgeon: Dr. 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 as well as the first interspace #3 foot Abscess right Procedures:  1) incision and drainage of multiple tissue planes right foot Pathology/Specimens: ID Type Source Tests Collected by Time Destination  A : Right foot tissue; culture Tissue PATH Other AEROBIC/ANAEROBIC CULTURE W GRAM STAIN (SURGICAL/DEEP WOUND) [name], DPM 05/06/2021 [phone]    Anesthesia: MAC with local Hemostasis:  Total Tourniquet Time Documented: Calf (Right) - 23 minutes Total: Calf (Right) - 23 minutes  Estimated Blood Loss: Minimal Materials: * No implants in log * Medications: 500 mg vancomycin powder put into wound, 10 cc half percent Marcaine plain Complications: None  Indications for Procedure:  This is a 55 y.o. male with a history of newly diagnosed type 2 diabetes A1c over 12%, suspect puncture wound at his job leading to a diabetic foot infection on the right foot.  He was admitted and placed on IV antibiotics and a preoperative MRI was obtained.  The preoperative MRI showed cellulitis and no discrete drainable abscess or osteomyelitis.  If not improving with IV antibiotics and local debridement on the floor at bedside, I elected to take him to the operating room for explorative I&D.   Procedure in Detail: Patient was identified in pre-operative holding area. Formal consent was signed and the right lower extremity was marked. Patient was brought back to the operating room. Anesthesia was induced. The extremity was prepped and draped in the usual sterile fashion. Timeout was taken to confirm patient name, laterality, and procedure prior to  incision.   Attention was then directed to the right foot where an open necrotic ulceration on the medial hallux was present this [place] on plantarly along the digital sulcus and into the first interspace.  I began by using a scalpel and rongeur to excise all necrotic tissue.  An extensive amount of soft tissue and myonecrosis was found.  Small area of purulence was found as well on the medial hallux and I took a tissue culture of this and sent for pathology.  I continued my dissection into the first interspace below the deep fascia and then further into the dorsal subcutaneous tissues where a large abscess was found with seropurulent type drainage.  A counterincision was made on the lateral dorsal foot to allow this to drain out.  Plantarly the necrosis extended along the medial second ray and into the plantar digital sulcus laterally.  This was explored and irrigated thoroughly.  A liter of normal sterile saline was irrigated into the wound until no further necrotic tissue or purulence remained.  I partially closed the incisions using 3-0 nylon.  5 mg of vancomycin powder was placed into the wound and it was packed open using iodoform packing through the counterincisions.  It was then dressed with dry sterile gauze ABD pad and Kerlix and an Ace wrap.  He tolerated procedure well. .   Disposition: Following a period of post-operative monitoring, patient will be transferred back to the floor.  This will be the first of a staged procedure he would likely require at least a partial first ray amp as I do not expect the hallux to survive from a circulatory standpoint due to the severity of the  infection circumferentially around the toe.

## 2021-05-06 NOTE — Transfer of Care (Signed)
Immediate Anesthesia Transfer of Care Note  Patient: HELMUT HENNON  Procedure(s) Performed: IRRIGATION AND DEBRIDEMENT FOOT (Right)  Patient Location: PACU  Anesthesia Type:General  Level of Consciousness: awake, alert  and oriented  Airway & Oxygen Therapy: Patient Spontanous Breathing  Post-op Assessment: Report given to RN  Post vital signs: Reviewed and stable  Last Vitals:  Vitals Value Taken Time  BP 113/75 05/06/21 0905  Temp    Pulse 68 05/06/21 0908  Resp 17 05/06/21 0908  SpO2 100 % 05/06/21 0908  Vitals shown include unvalidated device data.  Last Pain:  Vitals:   05/06/21 0729  TempSrc:   PainSc: 0-No pain         Complications: No notable events documented.

## 2021-05-06 NOTE — Anesthesia Preprocedure Evaluation (Addendum)
Anesthesia Evaluation  Patient identified by MRN, date of birth, ID band Patient awake    History of Anesthesia Complications Negative for: history of anesthetic complications  Airway Mallampati: II  TM Distance: >3 FB Neck ROM: Full    Dental  (+) Teeth Intact   Pulmonary    Pulmonary exam normal        Cardiovascular hypertension,  Rhythm:Regular  hyperlipidemia   Neuro/Psych PSYCHIATRIC DISORDERS Depression    GI/Hepatic   Endo/Other  diabetes, Poorly Controlled  Renal/GU      Musculoskeletal Pt with great toe infection/cellulitis after stepping on something at work last week.   Abdominal   Peds  Hematology   Anesthesia Other Findings   Reproductive/Obstetrics                            Anesthesia Physical Anesthesia Plan  ASA: 2  Anesthesia Plan: General   Post-op Pain Management:    Induction: Intravenous  PONV Risk Score and Plan: 1 and Ondansetron, TIVA and Propofol infusion  Airway Management Planned: Natural Airway  Additional Equipment:   Intra-op Plan:   Post-operative Plan:   Informed Consent: I have reviewed the patients History and Physical, chart, labs and discussed the procedure including the risks, benefits and alternatives for the proposed anesthesia with the patient or authorized representative who has indicated his/her understanding and acceptance.       Plan Discussed with: CRNA  Anesthesia Plan Comments: (IVGA)      Anesthesia Quick Evaluation

## 2021-05-07 ENCOUNTER — Encounter: Payer: Self-pay | Admitting: Podiatry

## 2021-05-07 LAB — GLUCOSE, CAPILLARY
Glucose-Capillary: 187 mg/dL — ABNORMAL HIGH (ref 70–99)
Glucose-Capillary: 184 mg/dL — ABNORMAL HIGH (ref 70–99)
Glucose-Capillary: 214 mg/dL — ABNORMAL HIGH (ref 70–99)
Glucose-Capillary: 143 mg/dL — ABNORMAL HIGH (ref 70–99)
Glucose-Capillary: 135 mg/dL — ABNORMAL HIGH (ref 70–99)

## 2021-05-07 LAB — URINE CULTURE: Culture: NO GROWTH

## 2021-05-07 LAB — CREATININE, SERUM
Creatinine, Ser: 1.03 mg/dL (ref 0.61–1.24)
GFR, Estimated: >60 mL/min (ref >60)

## 2021-05-07 MED ORDER — INSULIN GLARGINE-YFGN 100 UNIT/ML ~~LOC~~ SOLN
5.0000 [IU] | Freq: Two times a day (BID) | SUBCUTANEOUS | Status: DC
  Administered 2021-05-07 – 2021-05-08 (×2): 5 [IU] via SUBCUTANEOUS
  Filled 2021-05-07 (×3): qty 0.05

## 2021-05-07 MED ORDER — VANCOMYCIN HCL 750 MG/150ML IV SOLN
750.0000 mg | Freq: Two times a day (BID) | INTRAVENOUS | Status: DC
  Administered 2021-05-08 (×2): 750 mg via INTRAVENOUS
  Filled 2021-05-07 (×3): qty 150

## 2021-05-07 NOTE — Consult Note (Signed)
Pharmacy Antibiotic Note  Jesus Flowers is a 54 y.o. male admitted on 05/03/2021 with right foot wound infection. X-ray of right foot is negative for osteomyelitis. Pharmacy has been consulted for vancomycin and zosyn dosing.  Day 5 antibiotics   Plan: Scr increased today: 1.30>0.97 >0.82>1.03 Will adjust Vancomycin back to 750 mg q12h  Goal AUC 400-550  Est AUC: 464.5 Est Cmax: 27.9 Est Cmin: 12.8 Calculated with SCr 1.03, Vd 0.72  Continue Zosyn 3.375g q8 hours extended infusion  Monitor clinical picture, renal function, and vancomycin levels at steady state F/U C&S, abx deescalation / LOT   Height: 5\' 6"  (167.6 cm) Weight: 68.9 kg (152 lb) IBW/kg (Calculated) : 63.8  Temp (24hrs), Avg:98.8 F (37.1 C), Min:98.2 F (36.8 C), Max:99.6 F (37.6 C)  Recent Labs  Lab 05/03/21 0949 05/03/21 1452 05/04/21 0604 05/05/21 0457 05/05/21 1802 05/06/21 0456 05/07/21 0436  WBC 22.2*  --  19.3* 21.8* 18.9* 17.0*  --   CREATININE 1.30*  --  0.97 0.82  --  0.87 1.03  LATICACIDVEN 1.9 0.9  --   --   --   --   --      Estimated Creatinine Clearance: 73.1 mL/min (by C-G formula based on SCr of 1.03 mg/dL).    No Known Allergies  Antimicrobials this admission: 10/6 ceftriaxone x1 10/6 vancomycin >>  10/6 cefepime >> 10/8 10/8 zosyn >>  Dose adjustments this admission: 10/7: Cefepime from 2 gm q12h to q8h  Vanc from 1250mg  IV q24h to 750mg  q12h 10/8: Vanc from 750mg  q12 to 1000mg  q12 10/10: Vanc 1000mg  q12 to 750mg  q12  Microbiology results: 10/6 BCx: NG 4 days 10/6 UCx: NG 10/7 I&D cx: GBS  10/9 surgical cx: GBS   Thank you for allowing pharmacy to be a part of this patient's care.  , PharmD Clinical Pharmacist 05/07/2021 4:10 PM

## 2021-05-07 NOTE — Progress Notes (Signed)
[place] [facility] [place] NAME: Jesus Flowers    MR#:  [id number]  DATE OF BIRTH:  1965-12-08  SUBJECTIVE:  patient here with right great toe infection after injury at work. Denies any pain at present.  No new complaints REVIEW OF SYSTEMS:   Review of Systems  Constitutional:  Negative for chills, fever and weight loss.  HENT:  Negative for ear discharge, ear pain and nosebleeds.   Eyes:  Negative for blurred vision, pain and discharge.  Respiratory:  Negative for sputum production, shortness of breath, wheezing and stridor.   Cardiovascular:  Negative for chest pain, palpitations, orthopnea and PND.  Gastrointestinal:  Negative for abdominal pain, diarrhea, nausea and vomiting.  Genitourinary:  Negative for frequency and urgency.  Musculoskeletal:  Negative for back pain and joint pain.  Neurological:  Positive for weakness. Negative for sensory change, speech change and focal weakness.  Psychiatric/Behavioral:  Negative for depression and hallucinations. The patient is not nervous/anxious.   Tolerating Diet:yes Tolerating PT:   DRUG ALLERGIES:  No Known Allergies  VITALS:  Blood pressure (!) 143/88, pulse 85, temperature 98.4 F (36.9 C), temperature source Oral, resp. rate 16, height 5' 6"  (1.676 m), weight 68.9 kg, SpO2 98 %.  PHYSICAL EXAMINATION:   Physical Exam  GENERAL:  55 y.o.-year-old patient lying in the bed with no acute distress.  HEENT: Head atraumatic, normocephalic. Oropharynx and nasopharynx clear.  NECK:  Supple, no jugular venous distention. No thyroid enlargement, no tenderness.  LUNGS: Normal breath sounds bilaterally, no wheezing, rales, rhonchi. No use of accessory muscles of respiration.  CARDIOVASCULAR: S1, S2 normal. No murmurs, rubs, or gallops.  ABDOMEN: Soft, nontender, nondistended. Bowel sounds present. No organomegaly or mass.  EXTREMITIES: No cyanosis, clubbing or edema b/l.    NEUROLOGIC: ++ sensory  deficits b/l.  Rest nonfocal PSYCHIATRIC:  patient is alert and oriented x 3.  SKIN: No obvious rash, lesion, or ulcer.   LABORATORY PANEL:  CBC Recent Labs  Lab 05/06/21 0456  WBC 17.0*  HGB 11.8*  HCT 33.3*  PLT 362     Chemistries  Recent Labs  Lab 05/03/21 0949 05/04/21 0604 05/06/21 0456 05/07/21 0436  NA 132*   < > 133*  --   K 4.5   < > 4.1  --   CL 97*   < > 104  --   CO2 20*   < > 23  --   GLUCOSE 393*   < > 150*  --   BUN 31*   < > 17  --   CREATININE 1.30*   < > 0.87 1.03  CALCIUM 9.1   < > 8.6*  --   AST 15  --   --   --   ALT 18  --   --   --   ALKPHOS 189*  --   --   --   BILITOT 1.1  --   --   --    < > = values in this interval not displayed.    Cardiac Enzymes No results for input(s): TROPONINI in the last 168 hours. RADIOLOGY:  No results found. ASSESSMENT AND PLAN:    Jesus Flowers is a 55 y.o. male with medical history significant of hypertension, hyperlipidemia, diabetes mellitus, depression, who presents with right foot wound infection.  Pt states that he he stepped on something at work 1 week ago. He did not think it was a big deal. He developed pain  and swelling in right foot over the past week.  Sepsis due to right foot wound infection: Patient meets criteria for sepsis with WBC 22.2, tachycardia with heart rate of 129.  Also has fever 100.3.  Lactic acid is normal.  Currently hemodynamically stable.    - Empiric antimicrobial treatment with vancomycin and zosyn - PRN Zofran for nausea, morphine and Percocet for pain - Blood cultures x 2 so far neg - ESR and CRP elevated -received IVF--d/c now --MRI foot noted--no abscess or fluid collection. S/o cellulitis --Dr Sherryle Lis to see pt--s/p debridement at bedside --10/8--lot of drainage noted on the dressing (bloody). WBC increased to 21K. await podiatry rec for ?OR debridement --10/9--wbc trending down. For I and d with washout today in the OR.  --10/10-- no new complaints. Dr Jacqualyn Posey to  discuss further surgery plans today   HTN: Patient not taking medications currently.  Blood pressure 132/88. -prn hydralazine   Hyperlipidemia: Patient is not taking medications currently -f/u with PCP   Diabetes mellitus without complication [facility]):   Blood sugar 393, anion gap 15.  Looks like poorly controlled.  Patient is taking Jardiance at home -Start Lantus 5 units daily while inhouse --Resume Jardiance,Janumet -Sliding scale insulin --A1c 12.3   Depression -Prozac   AKI (acute kidney injury) ([place]): Creatinine 1.0, BUN 31, no baseline creatinine available -IV fluid as above--d/c  -Avoid using renal toxic medications --creat 0.8       DVT ppx: SQ Heparin      Code Status: Full code Family Communication: none today Disposition Plan:  Anticipate discharge back to previous environment Consults called:  Dr. Sherryle Lis podiatry Admission status and  Level of care: Med-Surg:      as inpt           Status is: Inpatient   Remains inpatient appropriate because:Inpatient level of care appropriate due to severity of illness Podiatry needs to discuss about possible amputation   Dispo: The patient is from: Home              Anticipated d/c is to: Home in 1-2 days              Patient currently is not medically stable to d/c.              Difficult to place patient No       TOTAL TIME TAKING CARE OF THIS PATIENT: 25 minutes.  >50% time spent on counselling and coordination of care  Note: This dictation was prepared with Dragon dictation along with smaller phrase technology. Any transcriptional errors that result from this process are unintentional.  Fritzi Mandes M.D    [facility]   CC: Primary care physician; Pcp, No Patient ID: Jesus Flowers, male   DOB: 1966/02/09, 55 y.o.   MRN: [id number]

## 2021-05-07 NOTE — Plan of Care (Signed)
  Problem: Education: Goal: Knowledge of General Education information will improve Description: Including pain rating scale, medication(s)/side effects and non-pharmacologic comfort measures Outcome: Progressing   Problem: Clinical Measurements: Goal: Respiratory complications will improve Outcome: Progressing   Problem: Activity: Goal: Risk for activity intolerance will decrease Outcome: Progressing   Problem: Nutrition: Goal: Adequate nutrition will be maintained Outcome: Progressing   Problem: Elimination: Goal: Will not experience complications related to bowel motility Outcome: Progressing   Problem: Safety: Goal: Ability to remain free from injury will improve Outcome: Progressing   Problem: Skin Integrity: Goal: Risk for impaired skin integrity will decrease Outcome: Progressing

## 2021-05-07 NOTE — Progress Notes (Signed)
Subjective: POD #1 s/p right foot incision and drainage, wound debridement.  He states that his pain is currently controlled gets pain at 4/10.  No fevers or chills he reports.  Upon entering the room and introducing myself he states "I hope we will have to cut my toe off".    Objective: AAO x3, NAD DP/PT pulses palpable bilaterally, CRT less than 3 seconds Sensation absent bilaterally.   See pictures below packing was removed today.  Not able to identify any purulence.  Bloody drainage expressed from the wounds.  Dusky changes present the first and second toes.  There is also dusky changes present to the dorsal aspect of the midfoot and second metatarsal with some mild erythema.  No fluctuation or crepitation. No pain with calf compression, swelling, warmth, erythema       Assessment: POD #1 s/p right foot I&D, Debridement  Plan: Dressing was changed today.  I discussed with the patient likely need for amputation given the appearance today.  Discussed with him at least first but given the dusky appearance of the second toe likely first and second partial rays.  I asked on multiple occasions if  he understood and he states that he did and he had no further questions or concerns.  I offered to call family as well and he did not need to call anybody.  For now would recommend continue broad-spectrum antibiotics and check a CBC/BMP in the morning (orders placed).  We will check an ABI as well.  Discussed likely return to surgery on Wednesday but will change the bandage tomorrow to make final surgical plans.  He had no further questions or concerns today.  Ovid Curd, DPM

## 2021-05-08 LAB — BASIC METABOLIC PANEL
Anion gap: 10 (ref 5–15)
BUN: 16 mg/dL (ref 6–20)
CO2: 21 mmol/L — ABNORMAL LOW (ref 22–32)
Calcium: 8.6 mg/dL — ABNORMAL LOW (ref 8.9–10.3)
Chloride: 103 mmol/L (ref 98–111)
Creatinine, Ser: 0.94 mg/dL (ref 0.61–1.24)
GFR, Estimated: >60 mL/min (ref >60)
Glucose, Bld: 111 mg/dL — ABNORMAL HIGH (ref 70–99)
Potassium: 4.1 mmol/L (ref 3.5–5.1)
Sodium: 134 mmol/L — ABNORMAL LOW (ref 135–145)

## 2021-05-08 LAB — CULTURE, BLOOD (ROUTINE X 2)
Culture: NO GROWTH
Culture: NO GROWTH

## 2021-05-08 LAB — CBC WITH DIFFERENTIAL/PLATELET
Abs Immature Granulocytes: 0.37 10*3/uL — ABNORMAL HIGH (ref 0.00–0.07)
Basophils Absolute: 0.1 10*3/uL (ref 0.0–0.1)
Basophils Relative: 0 %
Eosinophils Absolute: 0.1 10*3/uL (ref 0.0–0.5)
Eosinophils Relative: 1 %
HCT: 29.3 % — ABNORMAL LOW (ref 39.0–52.0)
Hemoglobin: 10.1 g/dL — ABNORMAL LOW (ref 13.0–17.0)
Immature Granulocytes: 2 %
Lymphocytes Relative: 7 %
Lymphs Abs: 1.2 10*3/uL (ref 0.7–4.0)
MCH: 33.7 pg (ref 26.0–34.0)
MCHC: 34.5 g/dL (ref 30.0–36.0)
MCV: 97.7 fL (ref 80.0–100.0)
Monocytes Absolute: 1.4 10*3/uL — ABNORMAL HIGH (ref 0.1–1.0)
Monocytes Relative: 9 %
Neutro Abs: 13.3 10*3/uL — ABNORMAL HIGH (ref 1.7–7.7)
Neutrophils Relative %: 81 %
Platelets: 377 10*3/uL (ref 150–400)
RBC: 3 MIL/uL — ABNORMAL LOW (ref 4.22–5.81)
RDW: 11.4 % — ABNORMAL LOW (ref 11.5–15.5)
WBC: 16.4 10*3/uL — ABNORMAL HIGH (ref 4.0–10.5)
nRBC: 0 % (ref 0.0–0.2)

## 2021-05-08 LAB — AEROBIC/ANAEROBIC CULTURE W GRAM STAIN (SURGICAL/DEEP WOUND): Gram Stain: NONE SEEN

## 2021-05-08 LAB — GLUCOSE, CAPILLARY
Glucose-Capillary: 110 mg/dL — ABNORMAL HIGH (ref 70–99)
Glucose-Capillary: 191 mg/dL — ABNORMAL HIGH (ref 70–99)
Glucose-Capillary: 115 mg/dL — ABNORMAL HIGH (ref 70–99)
Glucose-Capillary: 170 mg/dL — ABNORMAL HIGH (ref 70–99)
Glucose-Capillary: 194 mg/dL — ABNORMAL HIGH (ref 70–99)

## 2021-05-08 MED ORDER — SODIUM CHLORIDE 0.9 % IV SOLN
3.0000 g | Freq: Four times a day (QID) | INTRAVENOUS | Status: DC
  Administered 2021-05-09 – 2021-05-11 (×9): 3 g via INTRAVENOUS
  Filled 2021-05-08 (×13): qty 8

## 2021-05-08 MED ORDER — INSULIN GLARGINE-YFGN 100 UNIT/ML ~~LOC~~ SOLN
8.0000 [IU] | Freq: Two times a day (BID) | SUBCUTANEOUS | Status: DC
  Administered 2021-05-08 – 2021-05-10 (×5): 8 [IU] via SUBCUTANEOUS
  Filled 2021-05-08 (×9): qty 0.08

## 2021-05-08 NOTE — Progress Notes (Signed)
[place] at [place] NAME: Artem Bunte    MR#:  [id number]  DATE OF BIRTH:  23-Jun-1966  SUBJECTIVE:  patient here with right great toe infection after injury at work. Denies any pain at present.  No new complaints. Awaintg final podiatry recs REVIEW OF SYSTEMS:   Review of Systems  Constitutional:  Negative for chills, fever and weight loss.  HENT:  Negative for ear discharge, ear pain and nosebleeds.   Eyes:  Negative for blurred vision, pain and discharge.  Respiratory:  Negative for sputum production, shortness of breath, wheezing and stridor.   Cardiovascular:  Negative for chest pain, palpitations, orthopnea and PND.  Gastrointestinal:  Negative for abdominal pain, diarrhea, nausea and vomiting.  Genitourinary:  Negative for frequency and urgency.  Musculoskeletal:  Negative for back pain and joint pain.  Neurological:  Positive for weakness. Negative for sensory change, speech change and focal weakness.  Psychiatric/Behavioral:  Negative for depression and hallucinations. The patient is not nervous/anxious.   Tolerating Diet:yes Tolerating PT:   DRUG ALLERGIES:  No Known Allergies  VITALS:  Blood pressure (!) 149/81, pulse 96, temperature 98.1 F (36.7 C), temperature source Oral, resp. rate 20, height 5' 6" (1.676 m), weight 68.9 kg, SpO2 97 %.  PHYSICAL EXAMINATION:   Physical Exam  GENERAL:  55 y.o.-year-old patient lying in the bed with no acute distress.  HEENT: Head atraumatic, normocephalic. Oropharynx and nasopharynx clear.  NECK:  Supple, no jugular venous distention. No thyroid enlargement, no tenderness.  LUNGS: Normal breath sounds bilaterally, no wheezing, rales, rhonchi. No use of accessory muscles of respiration.  CARDIOVASCULAR: S1, S2 normal. No murmurs, rubs, or gallops.  ABDOMEN: Soft, nontender, nondistended. Bowel sounds present. No organomegaly or mass.  EXTREMITIES:     NEUROLOGIC: ++ sensory  deficits b/l.  Rest nonfocal PSYCHIATRIC:  patient is alert and oriented x 3.  SKIN: as above  LABORATORY PANEL:  CBC Recent Labs  Lab 05/08/21 0543  WBC 16.4*  HGB 10.1*  HCT 29.3*  PLT 377     Chemistries  Recent Labs  Lab 05/03/21 0949 05/04/21 0604 05/08/21 0543  NA 132*   < > 134*  K 4.5   < > 4.1  CL 97*   < > 103  CO2 20*   < > 21*  GLUCOSE 393*   < > 111*  BUN 31*   < > 16  CREATININE 1.30*   < > 0.94  CALCIUM 9.1   < > 8.6*  AST 15  --   --   ALT 18  --   --   ALKPHOS 189*  --   --   BILITOT 1.1  --   --    < > = values in this interval not displayed.    Cardiac Enzymes No results for input(s): TROPONINI in the last 168 hours. RADIOLOGY:  No results found. ASSESSMENT AND PLAN:    BASEL DEFALCO is a 55 y.o. male with medical history significant of hypertension, hyperlipidemia, diabetes mellitus, depression, who presents with right foot wound infection.  Pt states that he he stepped on something at work 1 week ago. He did not think it was a big deal. He developed pain and swelling in right foot over the past week.  Sepsis due to right foot wound infection: Patient meets criteria for sepsis with WBC 22.2, tachycardia with heart rate of 129.  Also has fever 100.3.  Lactic acid is normal.  Currently hemodynamically stable.    - Empiric antimicrobial treatment with vancomycin and zosyn - PRN Zofran for nausea, morphine and Percocet for pain - Blood cultures x 2 so far neg - ESR and CRP elevated -received IVF--d/c now --MRI foot noted--no abscess or fluid collection. S/o cellulitis --Dr Mcdonald to see pt--s/p debridement at bedside --10/8--lot of drainage noted on the dressing (bloody). WBC increased to 21K. await podiatry rec for ?OR debridement --10/9--wbc trending down. For I and d with washout today in the OR.  --10/10-- no new complaints. Dr Wagoner to discuss further surgery plans today --10/11 D/w Dr wagoner--pt will be scheduled for amputation  great toe likely for 10/12   HTN: Patient not taking medications currently.  Blood pressure 132/88. -prn hydralazine   Hyperlipidemia: Patient is not taking medications currently -f/u with PCP   Diabetes mellitus with hyperglycemia, peripheral neuropathy(HCC):   Blood sugar 393, anion gap 15.  Looks like poorly controlled.  Patient is taking Jardiance at home -Start Lantus 8 units daily while inhouse --Resume Jardiance,Janumet -Sliding scale insulin --A1c 12.3 --pt will benefit to go home on insulin   Depression -Prozac   AKI (acute kidney injury) (HCC): Creatinine 1.0, BUN 31, no baseline creatinine available -IV fluid as above--d/c  -Avoid using renal toxic medications --creat 0.8       DVT ppx: SQ Heparin      Code Status: Full code Family Communication: none today Disposition Plan:  Anticipate discharge back to previous environment Consults called:  Dr. Mcdonald , wagoner of podiatry Admission status and  Level of care: Med-Surg:      as inpt           Status is: Inpatient   Remains inpatient appropriate because:Inpatient level of care appropriate due to severity of illness Podiatry needs to discuss about possible amputation   Dispo: The patient is from: Home              Anticipated d/c is to: TBD pending Podiatry planning further surgery              Patient currently is not medically stable to d/c.              Difficult to place patient No       TOTAL TIME TAKING CARE OF THIS PATIENT: 25 minutes.  >50% time spent on counselling and coordination of care  Note: This dictation was prepared with Dragon dictation along with smaller phrase technology. Any transcriptional errors that result from this process are unintentional.  Sona Patel M.D    [facility]   CC: Primary care physician; Pcp, No Patient ID: Jonluke L Lippold, male   DOB: 08/08/1965, 55 y.o.   MRN: [id number]  

## 2021-05-08 NOTE — Progress Notes (Signed)
   PODIATRY PROGRESS NOTE  NAME Jesus Flowers MRN [id number] DOB 1965/10/22 DOA 05/03/2021   Patient status post I&D  RT foot DOS: 05/06/2021.  Patient states that he is feeling well.  He did have some near syncopal episode recently when going to the bathroom.  He is kept the dressings clean dry and intact since last dressing change.  Currently denies fever chills nausea vomiting shortness of breath or chest pain.  Resting comfortably in bed with the nurse present during evaluation  Past Medical History:  Diagnosis Date   Depression    Diabetes mellitus without complication (HCC)    High cholesterol     CBC Latest Ref Rng & Units 05/08/2021 05/06/2021 05/05/2021  WBC 4.0 - 10.5 K/uL 16.4(H) 17.0(H) 18.9(H)  Hemoglobin 13.0 - 17.0 g/dL 10.1(L) 11.8(L) 12.5(L)  Hematocrit 39.0 - 52.0 % 29.3(L) 33.3(L) 35.7(L)  Platelets 150 - 400 K/uL 377 362 421(H)    BMP Latest Ref Rng & Units 05/08/2021 05/07/2021 05/06/2021  Glucose 70 - 99 mg/dL 749(S) - 496(P)  BUN 6 - 20 mg/dL 16 - 17  Creatinine 5.91 - 1.24 mg/dL 6.38 4.66 5.99  Sodium 135 - 145 mmol/L 134(L) - 133(L)  Potassium 3.5 - 5.1 mmol/L 4.1 - 4.1  Chloride 98 - 111 mmol/L 103 - 104  CO2 22 - 32 mmol/L 21(L) - 23  Calcium 8.9 - 10.3 mg/dL 3.5(T) - 8.6(L)         ASSESSMENT/PLAN OF CARE 1.  RT foot gangrene/cellulitis.  S/P I&D.  DOS: 05/06/2021 -Dressings changed today.  Keep clean dry and intact. -Weightbearing as tolerated -Planning for return to the OR tomorrow, 05/09/2021.  Preoperative orders will be placed this p.m. -N.p.o. after midnight -Today had a very pleasant discussion with the patient regarding surgery tomorrow.  I believe it would be in his best interest and serve him well for transmetatarsal amputation of the right foot.  This would allow optimum function and minimize any future long-term complications versus salvaging the lesser digits.  Patient agrees.  He is very amenable to a TMA of the right foot. -Podiatry  will continue to follow  Please contact me directly with any questions or concerns.  Cell [phone]   Felecia Shelling, DPM [facility]  Dr. Felecia Shelling, DPM    [address] [place], [place] [address]                Office [phone]  Fax [phone]

## 2021-05-08 NOTE — TOC Progression Note (Addendum)
Transition of Care [facility]) - Progression Note    Patient Details  Name: Jesus Flowers MRN: [id number] Date of Birth: Feb 24, 1966  Transition of [facility]) CM/SW Contact  Marlowe Sax, RN Phone Number: 05/08/2021, 10:50 AM  Clinical Narrative:     Surgery anticipated for tomorrow, TOC will follow for needs, He lives at home with his son, he stated that he does not have a Rolling walker or other DME will need a Rolling walker, he does not have insurance and therefor can not go to [facility] SNF,, is agreeable to [facility], will get set up, I contacted [place] with [employer] that is on for charity this week, Bjorn Loser with [employer] will bring a Rw thru charity to the room       Expected Discharge Plan and Services                                                 Social Determinants of Health (SDOH) Interventions    Readmission Risk Interventions No flowsheet data found.

## 2021-05-09 ENCOUNTER — Encounter
Admission: EM | Disposition: A | Discharge status: Home / Self Care | Payer: Self-pay | Source: Home / Self Care | Attending: Internal Medicine

## 2021-05-09 ENCOUNTER — Inpatient Hospital Stay: Payer: Self-pay | Admitting: Registered Nurse

## 2021-05-09 ENCOUNTER — Encounter: Payer: Self-pay | Admitting: Internal Medicine

## 2021-05-09 DIAGNOSIS — L97509 Non-pressure chronic ulcer of other part of unspecified foot with unspecified severity: Secondary | ICD-10-CM

## 2021-05-09 DIAGNOSIS — E1142 Type 2 diabetes mellitus with diabetic polyneuropathy: Secondary | ICD-10-CM

## 2021-05-09 DIAGNOSIS — E11621 Type 2 diabetes mellitus with foot ulcer: Secondary | ICD-10-CM

## 2021-05-09 DIAGNOSIS — E1152 Type 2 diabetes mellitus with diabetic peripheral angiopathy with gangrene: Secondary | ICD-10-CM

## 2021-05-09 HISTORY — PX: TRANSMETATARSAL AMPUTATION: SHX6197

## 2021-05-09 LAB — GLUCOSE, CAPILLARY
Glucose-Capillary: 161 mg/dL — ABNORMAL HIGH (ref 70–99)
Glucose-Capillary: 101 mg/dL — ABNORMAL HIGH (ref 70–99)
Glucose-Capillary: 73 mg/dL (ref 70–99)
Glucose-Capillary: 140 mg/dL — ABNORMAL HIGH (ref 70–99)

## 2021-05-09 LAB — SURGICAL PCR SCREEN
MRSA, PCR: NEGATIVE
Staphylococcus aureus: NEGATIVE

## 2021-05-09 LAB — CBC
HCT: 29.2 % — ABNORMAL LOW (ref 39.0–52.0)
Hemoglobin: 10 g/dL — ABNORMAL LOW (ref 13.0–17.0)
MCH: 33 pg (ref 26.0–34.0)
MCHC: 34.2 g/dL (ref 30.0–36.0)
MCV: 96.4 fL (ref 80.0–100.0)
Platelets: 379 10*3/uL (ref 150–400)
RBC: 3.03 MIL/uL — ABNORMAL LOW (ref 4.22–5.81)
RDW: 11.7 % (ref 11.5–15.5)
WBC: 12.2 10*3/uL — ABNORMAL HIGH (ref 4.0–10.5)
nRBC: 0 % (ref 0.0–0.2)

## 2021-05-09 SURGERY — AMPUTATION, FOOT, TRANSMETATARSAL
Anesthesia: General | Site: Toe | Laterality: Right

## 2021-05-09 MED ORDER — LIDOCAINE HCL (CARDIAC) PF 100 MG/5ML IV SOSY
PREFILLED_SYRINGE | INTRAVENOUS | Status: DC | PRN
  Administered 2021-05-09: 40 mg via INTRAVENOUS

## 2021-05-09 MED ORDER — FENTANYL CITRATE (PF) 100 MCG/2ML IJ SOLN: 25.0000 ug | INTRAMUSCULAR | Status: DC | PRN

## 2021-05-09 MED ORDER — PROPOFOL 10 MG/ML IV BOLUS
INTRAVENOUS | Status: AC
  Filled 2021-05-09: qty 20

## 2021-05-09 MED ORDER — DEXMEDETOMIDINE HCL IN NACL 200 MCG/50ML IV SOLN
INTRAVENOUS | Status: AC
  Filled 2021-05-09: qty 50

## 2021-05-09 MED ORDER — FENTANYL CITRATE (PF) 100 MCG/2ML IJ SOLN
INTRAMUSCULAR | Status: AC
  Filled 2021-05-09: qty 2

## 2021-05-09 MED ORDER — ONDANSETRON HCL 4 MG/2ML IJ SOLN: 4.0000 mg | Freq: Once | INTRAMUSCULAR | Status: DC | PRN

## 2021-05-09 MED ORDER — MIDAZOLAM HCL 2 MG/2ML IJ SOLN
INTRAMUSCULAR | Status: DC | PRN
  Administered 2021-05-09: 2 mg via INTRAVENOUS

## 2021-05-09 MED ORDER — PROPOFOL 500 MG/50ML IV EMUL
INTRAVENOUS | Status: DC | PRN
  Administered 2021-05-09: 100 ug/kg/min via INTRAVENOUS

## 2021-05-09 MED ORDER — BUPIVACAINE HCL (PF) 0.5 % IJ SOLN
INTRAMUSCULAR | Status: AC
  Filled 2021-05-09: qty 30

## 2021-05-09 MED ORDER — FENTANYL CITRATE (PF) 100 MCG/2ML IJ SOLN
INTRAMUSCULAR | Status: DC | PRN
  Administered 2021-05-09 (×2): 12.5 ug via INTRAVENOUS
  Administered 2021-05-09: 25 ug via INTRAVENOUS

## 2021-05-09 MED ORDER — PROPOFOL 1000 MG/100ML IV EMUL
INTRAVENOUS | Status: AC
  Filled 2021-05-09: qty 100

## 2021-05-09 MED ORDER — LIDOCAINE HCL (PF) 1 % IJ SOLN
INTRAMUSCULAR | Status: AC
  Filled 2021-05-09: qty 30

## 2021-05-09 MED ORDER — MIDAZOLAM HCL 2 MG/2ML IJ SOLN
INTRAMUSCULAR | Status: AC
  Filled 2021-05-09: qty 2

## 2021-05-09 MED ORDER — BUPIVACAINE HCL (PF) 0.5 % IJ SOLN
INTRAMUSCULAR | Status: DC | PRN
  Administered 2021-05-09: 20 mL

## 2021-05-09 MED ORDER — PROPOFOL 10 MG/ML IV BOLUS
INTRAVENOUS | Status: DC | PRN
  Administered 2021-05-09: 40 mg via INTRAVENOUS

## 2021-05-09 MED ORDER — 0.9 % SODIUM CHLORIDE (POUR BTL) OPTIME
TOPICAL | Status: DC | PRN
  Administered 2021-05-09: 500 mL

## 2021-05-09 MED ORDER — LIDOCAINE HCL (PF) 2 % IJ SOLN
INTRAMUSCULAR | Status: AC
  Filled 2021-05-09: qty 5

## 2021-05-09 MED ORDER — SODIUM CHLORIDE 0.9 % IV SOLN: INTRAVENOUS | Status: DC | PRN

## 2021-05-09 SURGICAL SUPPLY — 37 items
BAG COUNTER SPONGE SURGICOUNT (BAG) ×2 IMPLANT
BAG SPNG CNTER NS LX DISP (BAG) ×1
BLADE OSCILLATING/SAGITTAL (BLADE) ×2
BLADE SURG 15 STRL LF DISP TIS (BLADE) ×2 IMPLANT
BLADE SURG 15 STRL SS (BLADE) ×4
BLADE SW THK.38XMED LNG THN (BLADE) ×1 IMPLANT
BNDG ELASTIC 4X5.8 VLCR STR LF (GAUZE/BANDAGES/DRESSINGS) ×2 IMPLANT
BNDG ESMARK 4X12 TAN STRL LF (GAUZE/BANDAGES/DRESSINGS) ×2 IMPLANT
BNDG GAUZE ELAST 4 BULKY (GAUZE/BANDAGES/DRESSINGS) ×2 IMPLANT
ELECT REM PT RETURN 9FT ADLT (ELECTROSURGICAL) ×2
ELECTRODE REM PT RTRN 9FT ADLT (ELECTROSURGICAL) ×1 IMPLANT
GAUZE SPONGE 4X4 12PLY STRL (GAUZE/BANDAGES/DRESSINGS) ×6 IMPLANT
GAUZE XEROFORM 1X8 LF (GAUZE/BANDAGES/DRESSINGS) ×4 IMPLANT
GLOVE SURG ENC MOIS LTX SZ7 (GLOVE) ×2 IMPLANT
GLOVE SURG UNDER LTX SZ7 (GLOVE) ×2 IMPLANT
GOWN STRL REUS W/ TWL LRG LVL3 (GOWN DISPOSABLE) ×2 IMPLANT
GOWN STRL REUS W/TWL LRG LVL3 (GOWN DISPOSABLE) ×4
HANDLE YANKAUER SUCT BULB TIP (MISCELLANEOUS) ×2 IMPLANT
KIT TURNOVER KIT A (KITS) ×2 IMPLANT
MANIFOLD NEPTUNE II (INSTRUMENTS) ×2 IMPLANT
NDL SAFETY ECLIPSE 18X1.5 (NEEDLE) ×1 IMPLANT
NEEDLE HYPO 18GX1.5 SHARP (NEEDLE) ×2
NEEDLE HYPO 25X1 1.5 SAFETY (NEEDLE) ×4 IMPLANT
NS IRRIG 500ML POUR BTL (IV SOLUTION) ×2 IMPLANT
PACK EXTREMITY ARMC (MISCELLANEOUS) ×2 IMPLANT
PAD ABD DERMACEA PRESS 5X9 (GAUZE/BANDAGES/DRESSINGS) ×4 IMPLANT
SOL PREP PVP 2OZ (MISCELLANEOUS) ×4
SOLUTION PREP PVP 2OZ (MISCELLANEOUS) ×2 IMPLANT
SPONGE T-LAP 18X18 ~~LOC~~+RFID (SPONGE) ×2 IMPLANT
STAPLER SKIN PROX 35W (STAPLE) ×2 IMPLANT
STOCKINETTE M/LG 89821 (MISCELLANEOUS) ×2 IMPLANT
SUT ETHILON 2 0 FSLX (SUTURE) ×6 IMPLANT
SUT MON AB 3-0 SH 27 (SUTURE) ×2 IMPLANT
SUT PDS 2-0 27IN (SUTURE) ×6 IMPLANT
SUT VIC AB 2-0 CT1 27 (SUTURE) ×2
SUT VIC AB 2-0 CT1 TAPERPNT 27 (SUTURE) ×1 IMPLANT
SYR 10ML LL (SYRINGE) ×4 IMPLANT

## 2021-05-09 NOTE — Hospital Course (Signed)
55yom presented with right great toe infection.  Admitted for sepsis secondary to same.  Seen by podiatry, given gangrene, amputation was recommended. -- 10/12 transmetatarsal amputation right foot

## 2021-05-09 NOTE — Assessment & Plan Note (Addendum)
--  resolved, secondary to foot infection, will continue abx

## 2021-05-09 NOTE — Progress Notes (Signed)
Per DPM [name] pt is ok to have all medications except for Heparin.

## 2021-05-09 NOTE — H&P (Signed)
History and Physical Interval Note:  05/09/2021 5:36 PM  Jesus Flowers  has presented today for surgery, with the diagnosis of right foot infection .  The various methods of treatment have been discussed with the patient and family. After consideration of risks, benefits and other options for treatment, the patient has consented to   Procedure(s): TRANSMETATARSAL AMPUTATION (Right) as a surgical intervention.  The patient's history has been reviewed, patient examined, no change in status, stable for surgery.  I have reviewed the patient's chart and labs.  Questions were answered to the patient's satisfaction.     [name]

## 2021-05-09 NOTE — Assessment & Plan Note (Addendum)
--  not on specific therapy

## 2021-05-09 NOTE — Progress Notes (Signed)
PT Cancellation Note  Patient Details Name: Jesus Flowers MRN: [id number] DOB: 1966-01-11   Cancelled Treatment:    Reason Eval/Treat Not Completed: Patient not medically ready. Patient having surgery for Transmet amputation today. Will plan to evaluate after surgery.    Quadry Kampa 05/09/2021, 10:01 AM

## 2021-05-09 NOTE — Anesthesia Preprocedure Evaluation (Addendum)
Anesthesia Evaluation  Patient identified by MRN, date of birth, ID band Patient awake    Reviewed: Allergy & Precautions, H&P , NPO status , Patient's Chart, lab work & pertinent test results, reviewed documented beta blocker date and time   History of Anesthesia Complications Negative for: history of anesthetic complications  Airway Mallampati: II  TM Distance: >3 FB Neck ROM: full    Dental  (+) Poor Dentition, Chipped   Pulmonary neg pulmonary ROS, neg sleep apnea, neg COPD, Patient abstained from smoking.Not current smoker,    Pulmonary exam normal breath sounds clear to auscultation       Cardiovascular Exercise Tolerance: Good METShypertension, (-) CAD and (-) Past MI Normal cardiovascular exam(-) dysrhythmias  Rhythm:regular Rate:Normal     Neuro/Psych PSYCHIATRIC DISORDERS Depression negative neurological ROS     GI/Hepatic negative GI ROS, Neg liver ROS, neg GERD  ,  Endo/Other  diabetes, Poorly Controlled, Type 2, Insulin Dependent  Renal/GU Renal disease  negative genitourinary   Musculoskeletal   Abdominal   Peds  Hematology negative hematology ROS (+)   Anesthesia Other Findings Past Medical History: No date: Depression No date: Diabetes mellitus without complication (HCC) No date: High cholesterol Past Surgical History: No date: BACK SURGERY 05/06/2021: IRRIGATION AND DEBRIDEMENT FOOT; Right     Comment:  Procedure: IRRIGATION AND DEBRIDEMENT FOOT;  Surgeon:               [name], DPM;  Location: [facility] ORS;  Service:               Podiatry;  Laterality: Right; BMI    Body Mass Index: 24.53 kg/m     Reproductive/Obstetrics negative OB ROS                            Anesthesia Physical Anesthesia Plan  ASA: 3  Anesthesia Plan: General   Post-op Pain Management:    Induction: Intravenous  PONV Risk Score and Plan: 3 and Ondansetron, Dexamethasone and  Midazolam  Airway Management Planned: LMA  Additional Equipment: None  Intra-op Plan:   Post-operative Plan: Extubation in OR  Informed Consent: I have reviewed the patients History and Physical, chart, labs and discussed the procedure including the risks, benefits and alternatives for the proposed anesthesia with the patient or authorized representative who has indicated his/her understanding and acceptance.     Dental Advisory Given  Plan Discussed with: CRNA  Anesthesia Plan Comments: (Discussed risks of anesthesia with patient, including PONV, sore throat, lip/dental damage. Rare risks discussed as well, such as cardiorespiratory and neurological sequelae, and allergic reactions. Patient understands. (patient spoke English well, and preferred to consent in english))       Anesthesia Quick Evaluation

## 2021-05-09 NOTE — Op Note (Signed)
Patient Name: Jesus Flowers DOB: 1966/02/03  MRN: [id number]   Date of Service: 05/03/2021 - 05/09/2021  Surgeon: Dr. Lanae Crumbly, DPM Assistants: None Pre-operative Diagnosis:  Diabetic foot infection Post-operative Diagnosis:  Diabetic foot infection Procedures:  1) transmetatarsal amputation right foot Pathology/Specimens: ID Type Source Tests Collected by Time Destination  1 : RIGHT FOOT Tissue PATH Other SURGICAL PATHOLOGY [name], DPM 05/09/2021 1806    Anesthesia: MAC and local Hemostasis:  Total Tourniquet Time Documented: Ankle (Right) - 17 minutes Total: Ankle (Right) - 17 minutes  Estimated Blood Loss: 30 cc Materials: * No implants in log * Medications: 20 cc half percent Marcaine plain Complications: None  Indications for Procedure:  This is a 55 y.o. male with a history of type 2 diabetes with a diabetic foot infection who underwent debridement on 05/06/2021.  Due to the severity of infection he required midfoot amputation.  He went to the OR today for this   Procedure in Detail: Patient was identified in pre-operative holding area. Formal consent was signed and the right lower extremity was marked. Patient was brought back to the operating room. Anesthesia was induced. The extremity was prepped and draped in the usual sterile fashion. Timeout was taken to confirm patient name, laterality, and procedure prior to incision.   Attention was then directed to the right foot where a fishmouth incision was made dorsally and plantarly.  The dissection was carried down to the level of the bone and the metatarsals were resected in a transmetatarsal amputation fashion.  Due to the severity of infection medially and the amount of tissue loss of the first metatarsal entirely had to be removed.  I then debrided the flaps of nonviable tissue.  The wound was thoroughly irrigated and hemostasis was then achieved.  Vascularity on the dorsal portion of the flap was poor.  The  wound is then closed in layers with 2-0 PDS, 2-0 nylon.  The foot was then dressed with Xeroform DSD and an Ace wrap. Patient tolerated the procedure well.   Disposition: Following a period of post-operative monitoring, patient will be transferred to the floor.

## 2021-05-09 NOTE — Transfer of Care (Signed)
Immediate Anesthesia Transfer of Care Note  Patient: Jesus Flowers  Procedure(s) Performed: TRANSMETATARSAL AMPUTATION (Right: Toe)  Patient Location: PACU  Anesthesia Type:MAC  Level of Consciousness: sedated and patient cooperative  Airway & Oxygen Therapy: Patient Spontanous Breathing and Patient connected to face mask oxygen  Post-op Assessment: Report given to RN and Post -op Vital signs reviewed and stable  Post vital signs: Reviewed and stable  Last Vitals:  Vitals Value Taken Time  BP 113/80 05/09/21 1909  Temp    Pulse 66 05/09/21 1911  Resp 16 05/09/21 1911  SpO2 100 % 05/09/21 1911  Vitals shown include unvalidated device data.  Last Pain:  Vitals:   05/09/21 1634  TempSrc:   PainSc: 0-No pain      Patients Stated Pain Goal: 4 (87/21/58 7276)  Complications: No notable events documented.

## 2021-05-09 NOTE — Assessment & Plan Note (Addendum)
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 Resume metformin on discharge.  --new to insulin, will need teaching, close outpt follow-up

## 2021-05-09 NOTE — Progress Notes (Signed)
  Progress Note    Jesus Flowers   OAC:[id number]  DOB: 13-Sep-1965  DOA: 05/03/2021     6 Date of Service: 05/09/2021   55yom presented with right great toe infection.  Admitted for sepsis secondary to same.  Seen by podiatry, given gangrene, amputation was recommended. 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  Vitals:   05/09/21 1634 05/09/21 1909 05/09/21 1915 05/09/21 1932  BP: 120/74 113/80 (!) 119/92   Pulse: 78 75 71   Resp: 16 16 17    Temp: 98.4 F (36.9 C) (!) 97.4 F (36.3 C)  (P) 98.6 F (37 C)  TempSrc:      SpO2: 100% 100% 100%   Weight: 68.9 kg     Height: 5\' 6"  (1.676 m)      68.9 kg  Exam Physical Exam Vitals reviewed.  Constitutional:      General: He is not in acute distress.    Appearance: He is not ill-appearing or toxic-appearing.  Cardiovascular:     Rate and Rhythm: Normal rate and regular rhythm.     Heart sounds: No murmur heard. Pulmonary:     Effort: Pulmonary effort is normal. No respiratory distress.     Breath sounds: No wheezing or rales.  Neurological:     Mental Status: He is alert.  Psychiatric:        Mood and Affect: Mood normal.        Behavior: Behavior normal.    Labs / Other Information My review of labs, imaging, notes and other tests is significant for   hemoglobin stable, WBC trending down 12.2   Time spent: 20 minutes [facility] 05/09/2021, 7:34 PM

## 2021-05-09 NOTE — Assessment & Plan Note (Signed)
resolved 

## 2021-05-10 ENCOUNTER — Encounter: Payer: Self-pay | Admitting: Podiatry

## 2021-05-10 DIAGNOSIS — E1142 Type 2 diabetes mellitus with diabetic polyneuropathy: Secondary | ICD-10-CM

## 2021-05-10 LAB — GLUCOSE, CAPILLARY
Glucose-Capillary: 125 mg/dL — ABNORMAL HIGH (ref 70–99)
Glucose-Capillary: 101 mg/dL — ABNORMAL HIGH (ref 70–99)
Glucose-Capillary: 100 mg/dL — ABNORMAL HIGH (ref 70–99)
Glucose-Capillary: 105 mg/dL — ABNORMAL HIGH (ref 70–99)
Glucose-Capillary: 114 mg/dL — ABNORMAL HIGH (ref 70–99)

## 2021-05-10 NOTE — Progress Notes (Signed)
Inpatient Diabetes Program Recommendations  AACE/ADA: New Consensus Statement on Inpatient Glycemic Control (2015)  Target Ranges:  Prepandial:   less than 140 mg/dL      Peak postprandial:   less than 180 mg/dL (1-2 hours)      Critically ill patients:  140 - 180 mg/dL  Results for MIZAEL, SAGAR (MRN [id number]) as of 05/10/2021 10:18  Ref. Range 05/09/2021 08:00 05/09/2021 11:33 05/09/2021 19:13 05/09/2021 22:06  Glucose-Capillary Latest Ref Range: 70 - 99 mg/dL 161 (H) 101 (H) 73 140 (H)  Results for KANYE, DEPREE (MRN [id number]) as of 05/10/2021 10:18  Ref. Range 05/10/2021 08:22  Glucose-Capillary Latest Ref Range: 70 - 99 mg/dL 125 (H)  Results for DARIC, KOREN (MRN [id number]) as of 05/10/2021 10:18  Ref. Range 05/03/2021 14:52  Hemoglobin A1C Latest Ref Range: 4.8 - 5.6 % 12.3 (H)  (306 mg/dl)    Admit with: Sepsis/ Foot infection  History: DM2  Home DM Meds: Jardiance 25 mg daily     Janumet 50-1000 mg BID  Current Orders: Semglee 8 units BID      Jardiance 25 mg daily      Tradjenta 5 mg Daily      Novolog Sensitive Correction Scale/ SSI (0-9 units) TID AC + HS     s/p right transmetatarsal amputation  1:30pm--Met w/ pt this afternoon to review going home on insulin.  Had on-site interpreter Leola Brazil assisting with translation.  Discussed with pt that he will need insulin for home to help with healing and prevent further infection, complications.  Pt in agreement to using insulin at home.  Discussed with pt that he is currently on long-acting insulin BID + rapid-acting insulin TID with meals with a sliding scale.  Explained how each of these work but currently I do not know which insulins pt will be sent home with.  Explained to pt that I want to show him how to use the insulin pen at home, however, his RN tomorrow will need to review with him what kind of insulin and the exact doses he will need to take at home.  Pt expressed understanding.  Educated patient on  insulin pen use at home.  Reviewed all steps of insulin pen including attachment of needle, 2-unit air shot, dialing up dose, giving injection, rotation of injection sites, removing needle, disposal of sharps, storage of unused insulin, disposal of insulin etc.  Patient able to provide successful return demonstration.  Reviewed troubleshooting with insulin pen.  Have asked RNs caring for patient to please allow patient to give all injections here in hospital as much as possible for practice.  MD to give patient Rxs for insulin pens and insulin pen needles.     --Will follow patient during hospitalization--  Wyn Quaker RN, MSN, CDE Diabetes Coordinator Inpatient Glycemic Control Team Team Pager: [phone] (8a-5p)

## 2021-05-10 NOTE — Hospital Course (Signed)
55yom presented with right great toe infection.  Admitted for sepsis secondary to same.  Seen by podiatry, given gangrene, amputation was recommended. -- 10/12 transmetatarsal amputation right foot --10/13 doing well.  No PT needed as an outpatient.  Anticipate discharge home in the next 48 hours when cleared by podiatry.  New start insulin, requires further education.

## 2021-05-10 NOTE — Progress Notes (Addendum)
  Progress Note    Jesus Flowers   NAT:[id number]  DOB: 01/22/1966  DOA: 05/03/2021     7 Date of Service: 05/10/2021  55yom presented with right great toe infection.  Admitted for sepsis secondary to same.  Seen by podiatry, given gangrene, amputation was recommended. -- 10/12 transmetatarsal amputation right foot --10/13 doing well.  No PT needed as an outpatient.  Anticipate discharge home in the next 48 hours when cleared by podiatry.  New start insulin, requires further education.   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 Resume metformin on discharge.  --new to insulin, will need teaching, close outpt follow-up  Diabetic peripheral neuropathy (HCC) --not on specific therapy  AKI (acute kidney injury) (HCC) --resolved   Subjective:  Feels fine, no complaints  Objective Vitals:   05/10/21 0634 05/10/21 0753 05/10/21 1158 05/10/21 1607  BP: (!) 141/85 122/74 116/69 (!) 147/78  Pulse: 76 77 80 84  Resp: 20 16 16 16   Temp: 98.2 F (36.8 C) 98.4 F (36.9 C) 98.2 F (36.8 C) 98.5 F (36.9 C)  TempSrc:      SpO2: 98% 98% 98% 99%  Weight:      Height:       68.9 kg  Vital signs were reviewed and unremarkable.  Exam Physical Exam Vitals reviewed.  Constitutional:      General: He is not in acute distress.    Appearance: He is not ill-appearing or toxic-appearing.  Cardiovascular:     Rate and Rhythm: Normal rate and regular rhythm.     Heart sounds: No murmur heard. Pulmonary:     Effort: Pulmonary effort is normal. No respiratory distress.     Breath sounds: No wheezing, rhonchi or rales.  Neurological:     Mental Status: He is alert.  Psychiatric:        Mood and Affect: Mood normal.        Behavior: Behavior normal.    Labs / Other  Information There are no new results to review at this time.  Disposition Plan: Status is: Inpatient  Remains inpatient appropriate because: Diabetic now on insulin requiring teaching, status post amputation yesterday  Time spent: 25 minutes [facility] 05/10/2021, 4:23 PM

## 2021-05-10 NOTE — Progress Notes (Signed)
PT Cancellation Note  Patient Details Name: Jesus Flowers MRN: [id number] DOB: 1966/03/04   Cancelled Treatment:    Reason Eval/Treat Not Completed: Pain limiting ability to participate. Patient requests I return later or tomorrow. Will re-attempt later.    [name] 05/10/2021, 10:20 AM

## 2021-05-10 NOTE — Evaluation (Signed)
Physical Therapy Evaluation Patient Details Name: Jesus Flowers MRN: [id number] DOB: [birth year] Today's Date: 05/10/2021  History of Present Illness  Jesus Flowers is a 55 y.o. male with medical history significant of hypertension, hyperlipidemia, diabetes mellitus, depression, who presents with right foot wound infection. Pt states that he he stepped on something at work 1 week ago. He did not think it was a big deal. He developed pain and swelling in right foot over the past week, which has been progressively worsening. 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 NWB Right  Clinical Impression  Patient received in bed, interpreter present with RN to educate on blood sugar management. Patient is agreeable to PT assessment. He is independent with bed mobility, transfers with supervision. Patient is able to ambulate in room with RW and min guard. Cues for NWB on right with good return demo. He will continue to benefit from skilled PT while here to improve functional independence and safety.        Recommendations for follow up therapy are one component of a multi-disciplinary discharge planning process, led by the attending physician.  Recommendations may be updated based on patient status, additional functional criteria and insurance authorization.  Follow Up Recommendations No PT follow up    Equipment Recommendations  None recommended by PT;Other (comment) (has RW in room)    Recommendations for Other Services       Precautions / Restrictions Precautions Precautions: Fall Restrictions Weight Bearing Restrictions: Yes RLE Weight Bearing: Non weight bearing Other Position/Activity Restrictions: has post op shoe in room      Mobility  Bed Mobility Overal bed mobility: Modified Independent                  Transfers Overall transfer level: Modified independent Equipment used: None                Ambulation/Gait Ambulation/Gait assistance: Min guard Gait  Distance (Feet): 30 Feet Assistive device: Rolling walker (2 wheeled) Gait Pattern/deviations: Step-to pattern Gait velocity: decr   General Gait Details: patient steady with ambulation using RW, NWB right  Stairs            Wheelchair Mobility    Modified Rankin (Stroke Patients Only)       Balance Overall balance assessment: Modified Independent                                           Pertinent Vitals/Pain Pain Assessment: 0-10 Pain Score: 4  Pain Location: R foot Pain Descriptors / Indicators: Discomfort;Sore Pain Intervention(s): Limited activity within patient's tolerance;Monitored during session;Repositioned    Home Living Family/patient expects to be discharged to:: Private residence Living Arrangements: Children;Other relatives Available Help at Discharge: Family;Available 24 hours/day Type of Home: House Home Access: Stairs to enter Entrance Stairs-Rails: Right;Left;Can reach both Entrance Stairs-Number of Steps: 3 Home Layout: One level Home Equipment: None      Prior Function Level of Independence: Independent               Hand Dominance        Extremity/Trunk Assessment   Upper Extremity Assessment Upper Extremity Assessment: Overall WFL for tasks assessed    Lower Extremity Assessment Lower Extremity Assessment: RLE deficits/detail RLE Deficits / Details: S/P transmet amputation       Communication   Communication: Prefers language other than English  Cognition Arousal/Alertness: Awake/alert  Behavior During Therapy: WFL for tasks assessed/performed Overall Cognitive Status: Within Functional Limits for tasks assessed                                        General Comments      Exercises     Assessment/Plan    PT Assessment Patient needs continued PT services  PT Problem List Decreased strength;Decreased mobility;Decreased activity tolerance;Pain;Decreased knowledge of  precautions;Decreased knowledge of use of DME       PT Treatment Interventions DME instruction;Therapeutic activities;Gait training;Stair training;Functional mobility training;Balance training;Therapeutic exercise;Patient/family education    PT Goals (Current goals can be found in the Care Plan section)  Acute Rehab PT Goals Patient Stated Goal: to go home, feel better PT Goal Formulation: With patient Time For Goal Achievement: 05/12/21 Potential to Achieve Goals: Good    Frequency Min 5X/week   Barriers to discharge        Co-evaluation               AM-PAC PT "6 Clicks" Mobility  Outcome Measure Help needed turning from your back to your side while in a flat bed without using bedrails?: None Help needed moving from lying on your back to sitting on the side of a flat bed without using bedrails?: None Help needed moving to and from a bed to a chair (including a wheelchair)?: A Little Help needed standing up from a chair using your arms (e.g., wheelchair or bedside chair)?: A Little Help needed to walk in hospital room?: A Little Help needed climbing 3-5 steps with a railing? : A Little 6 Click Score: 20    End of Session Equipment Utilized During Treatment: Gait belt Activity Tolerance: Patient tolerated treatment well Patient left: in bed;with call bell/phone within reach Nurse Communication: Mobility status PT Visit Diagnosis: Difficulty in walking, not elsewhere classified (R26.2);Pain Pain - Right/Left: Right Pain - part of body: Ankle and joints of foot    Time: 1330-1345 PT Time Calculation (min) (ACUTE ONLY): 15 min   Charges:   PT Evaluation $PT Eval Moderate Complexity: 1 Mod          Sherrill Buikema, PT, GCS 05/10/21,1:55 PM

## 2021-05-11 ENCOUNTER — Other Ambulatory Visit: Payer: Self-pay

## 2021-05-11 DIAGNOSIS — E440 Moderate protein-calorie malnutrition: Secondary | ICD-10-CM | POA: Insufficient documentation

## 2021-05-11 LAB — GLUCOSE, CAPILLARY
Glucose-Capillary: 74 mg/dL (ref 70–99)
Glucose-Capillary: 93 mg/dL (ref 70–99)

## 2021-05-11 LAB — AEROBIC/ANAEROBIC CULTURE W GRAM STAIN (SURGICAL/DEEP WOUND)

## 2021-05-11 MED ORDER — OXYCODONE-ACETAMINOPHEN 5-325 MG PO TABS: 1.0000 | ORAL_TABLET | Freq: Four times a day (QID) | ORAL | 0 refills | Status: DC | PRN

## 2021-05-11 MED ORDER — ENSURE MAX PROTEIN PO LIQD
11.0000 [oz_av] | Freq: Every day | ORAL | Status: DC
  Filled 2021-05-11: qty 330

## 2021-05-11 MED ORDER — DOXYCYCLINE HYCLATE 100 MG PO TABS
100.0000 mg | ORAL_TABLET | Freq: Two times a day (BID) | ORAL | Status: DC
  Administered 2021-05-11: 100 mg via ORAL
  Filled 2021-05-11: qty 1

## 2021-05-11 MED ORDER — ADULT MULTIVITAMIN W/MINERALS CH
1.0000 | ORAL_TABLET | Freq: Every day | ORAL | Status: DC
  Administered 2021-05-11: 1 via ORAL
  Filled 2021-05-11: qty 1

## 2021-05-11 MED ORDER — ADULT MULTIVITAMIN W/MINERALS CH: 1.0000 | ORAL_TABLET | Freq: Every day | ORAL | Status: AC

## 2021-05-11 MED ORDER — DOXYCYCLINE HYCLATE 100 MG PO TABS
100.0000 mg | ORAL_TABLET | Freq: Two times a day (BID) | ORAL | 0 refills | Status: DC
Start: 2021-05-11 — End: 2021-08-16

## 2021-05-11 MED ORDER — INSULIN PEN NEEDLE 31G X 5 MM MISC: 2 refills | Status: DC

## 2021-05-11 MED ORDER — JUVEN PO PACK: 1.0000 | PACK | Freq: Two times a day (BID) | ORAL | Status: DC

## 2021-05-11 MED ORDER — NEPRO/CARBSTEADY PO LIQD: 237.0000 mL | Freq: Three times a day (TID) | ORAL | Status: DC

## 2021-05-11 MED ORDER — ACETAMINOPHEN 325 MG PO TABS
650.0000 mg | ORAL_TABLET | Freq: Four times a day (QID) | ORAL | Status: DC | PRN
Start: 2021-05-11 — End: 2021-08-12

## 2021-05-11 MED ORDER — INSULIN GLARGINE 100 UNIT/ML ~~LOC~~ SOLN
5.0000 [IU] | Freq: Every day | SUBCUTANEOUS | 1 refills | Status: DC
Start: 2021-05-11 — End: 2021-08-16

## 2021-05-11 MED ORDER — INSULIN GLARGINE-YFGN 100 UNIT/ML ~~LOC~~ SOLN
5.0000 [IU] | Freq: Two times a day (BID) | SUBCUTANEOUS | Status: DC
  Administered 2021-05-11: 5 [IU] via SUBCUTANEOUS
  Filled 2021-05-11 (×2): qty 0.05

## 2021-05-11 NOTE — Discharge Summary (Signed)
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  Admitted for sepsis secondary to same.  Seen by podiatry, given gangrene, amputation was recommended. -- 10/12 transmetatarsal amputation right foot --10/13 doing well.  No PT needed as an outpatient.  Anticipate discharge home in the next 48 hours when cleared by podiatry.  New start insulin, requires further education. 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 Resume metformin on discharge.  --new to insulin, received teaching --2 weeks doxycycline as per Dr. Allena Katz --Follow-up with Dr. Lilian Kapur 1 week.  Nonweightbearing right lower extremity in cam boot.  Diabetic peripheral neuropathy (HCC) --not on specific therapy  AKI (acute kidney injury) (HCC) --resolved  Procedures performed: as above   Condition at discharge: good  Exam Physical Exam Vitals reviewed.  Constitutional:      General: He is not in acute distress.    Appearance: He is not ill-appearing or toxic-appearing.  Cardiovascular:     Rate and Rhythm: Normal rate and regular  rhythm.     Heart sounds: No murmur heard. Pulmonary:     Effort: Pulmonary effort is normal. No respiratory distress.     Breath sounds: No wheezing, rhonchi or rales.  Neurological:     Mental Status: He is alert.  Psychiatric:        Mood and Affect: Mood normal.        Behavior: Behavior normal.    Disposition: Home  Discharge time: greater than 30 minutes.  Follow-up Information     [facility]. Schedule an appointment as soon as possible for a visit in 1 week(s).   Specialty: General Practice Contact information: [address] [place] [address] [phone]         [name], DPM. Schedule an appointment as soon as possible for a visit in 1 week(s).   Specialty: Podiatry Contact information: [address] [place] [address] [phone]                 Allergies as of 05/11/2021   No Known Allergies      Medication List     TAKE these medications    acetaminophen 325 MG tablet Commonly known as: TYLENOL Take 2 tablets (650 mg total) by mouth every 6 (six) hours as needed for mild pain or fever.   doxycycline 100 MG tablet Commonly known as: VIBRA-TABS Take 1 tablet (100 mg total) by mouth every 12 (twelve) hours.   FLUoxetine 40 MG capsule Commonly known as: PROZAC Take 40 mg by mouth daily as needed.   insulin glargine 100 UNIT/ML injection Commonly known as: LANTUS Inject 0.05 mLs (5 Units total)  into the skin daily.   Insulin Pen Needle 31G X 5 MM Misc Use as directed   Jardiance 25 MG Tabs tablet Generic drug: empagliflozin Take 25 mg by mouth daily.   multivitamin with minerals Tabs tablet Take 1 tablet by mouth daily. Start taking on: May 12, 2021   oxyCODONE-acetaminophen 5-325 MG tablet Commonly known as: PERCOCET/ROXICET Take 1-2 tablets by mouth every 6 (six) hours as needed for moderate pain.   sitaGLIPtin-metformin 50-1000 MG tablet Commonly known as:  JANUMET Take 1 tablet by mouth 2 (two) times daily with a meal.               Durable Medical Equipment  (From admission, onward)           Start     Ordered   05/08/21 1307  For home use only DME Walker rolling  Once       Question Answer Comment  Walker: With 5 Inch Wheels   Patient needs a walker to treat with the following condition Weakness      05/08/21 1307   05/08/21 1058  For home use only DME Walker rolling  Once       Question Answer Comment  Walker: With 5 Inch Wheels   Patient needs a walker to treat with the following condition Weakness      05/08/21 1057              Discharge Care Instructions  (From admission, onward)           Start     Ordered   05/11/21 0000  Discharge wound care:       Comments: -No further dressing change needed. -He will follow-up with Dr. Lilian Kapur within 1 week from discharge. -Nonweightbearing to the right lower extremity in cam boot   05/11/21 1253            MR FOOT RIGHT W WO CONTRAST  Result Date: 05/03/2021 CLINICAL DATA:  Wound at the base of the great toe after stepping on an object. Redness and swelling. EXAM: MRI OF THE RIGHT FOREFOOT WITHOUT AND WITH CONTRAST TECHNIQUE: Multiplanar, multisequence MR imaging of the right foot was performed before and after the administration of intravenous contrast. CONTRAST:  87mL GADAVIST GADOBUTROL 1 MMOL/ML IV SOLN COMPARISON:  Radiographs, same date. FINDINGS: Diffuse subcutaneous soft tissue swelling/edema/fluid suggesting cellulitis. No discrete rim enhancing fluid collection to suggest a drainable abscess. Open wound noted on the plantar and medial aspect of the great toe but no obvious foreign body or abscess. Diffuse myofasciitis without findings to suggest pyomyositis. No MR findings suspicious for septic arthritis or osteomyelitis. IMPRESSION: 1. Diffuse subcutaneous soft tissue swelling/edema/fluid suggesting cellulitis. No discrete rim enhancing fluid  collection to suggest a drainable abscess. 2. Diffuse myofasciitis without findings to suggest pyomyositis. 3. No MR findings suspicious for septic arthritis or osteomyelitis. Electronically Signed   By: Rudie Meyer M.D.   On: 05/03/2021 16:15   DG Foot Complete Right  Result Date: 05/03/2021 CLINICAL DATA:  Diabetic with foot infection. EXAM: RIGHT FOOT COMPLETE - 3+ VIEW COMPARISON:  None. FINDINGS: Soft tissue swelling of the forefoot. No evidence of fracture. No radiographic sign of osteomyelitis. Regional arterial calcification is noted. IMPRESSION: Swelling of the forefoot.  No radiographic sign of osteomyelitis. Electronically Signed   By: Paulina Fusi M.D.   On: 05/03/2021 10:38   Results for orders placed or performed during the hospital encounter of 05/03/21  Blood Culture (routine x 2)  Status: None   Collection Time: 05/03/21 10:50 AM   Specimen: BLOOD  Result Value Ref Range Status   Specimen Description BLOOD  LFOA  Final   Special Requests   Final    BOTTLES DRAWN AEROBIC AND ANAEROBIC Blood Culture results may not be optimal due to an inadequate volume of blood received in culture bottles   Culture   Final    NO GROWTH 5 DAYS Performed at [facility], [address] [place], [place] [address]    Report Status 05/08/2021 FINAL  Final  Blood Culture (routine x 2)     Status: None   Collection Time: 05/03/21 10:55 AM   Specimen: BLOOD  Result Value Ref Range Status   Specimen Description BLOOD RAC  Final   Special Requests   Final    BOTTLES DRAWN AEROBIC AND ANAEROBIC Blood Culture results may not be optimal due to an inadequate volume of blood received in culture bottles   Culture   Final    NO GROWTH 5 DAYS Performed at [facility] [address] [place], [place] [address]    Report Status 05/08/2021 FINAL  Final  Resp Panel by RT-PCR (Flu A&B, Covid) Nasopharyngeal Swab     Status: None   Collection Time: 05/03/21 11:13 AM   Specimen:  Nasopharyngeal Swab; Nasopharyngeal(NP) swabs in vial transport medium  Result Value Ref Range Status   SARS Coronavirus 2 by RT PCR NEGATIVE NEGATIVE Final    Comment: (NOTE) SARS-CoV-2 target nucleic acids are NOT DETECTED.  The SARS-CoV-2 RNA is generally detectable in upper respiratory specimens during the acute phase of infection. The lowest concentration of SARS-CoV-2 viral copies this assay can detect is 138 copies/mL. A negative result does not preclude SARS-Cov-2 infection and should not be used as the sole basis for treatment or other patient management decisions. A negative result may occur with  improper specimen collection/handling, submission of specimen other than nasopharyngeal swab, presence of viral mutation(s) within the areas targeted by this assay, and inadequate number of viral copies(<138 copies/mL). A negative result must be combined with clinical observations, patient history, and epidemiological information. The expected result is Negative.  Fact Sheet for Patients:  BloggerCourse.com  Fact Sheet for Healthcare Providers:  SeriousBroker.it  This test is no t yet approved or cleared by the [place] FDA and  has been authorized for detection and/or diagnosis of SARS-CoV-2 by FDA under an Emergency Use Authorization (EUA). This EUA will remain  in effect (meaning this test can be used) for the duration of the COVID-19 declaration under Section 564(b)(1) of the Act, 21 U.S.C.section 360bbb-3(b)(1), unless the authorization is terminated  or revoked sooner.       Influenza A by PCR NEGATIVE NEGATIVE Final   Influenza B by PCR NEGATIVE NEGATIVE Final    Comment: (NOTE) The Xpert Xpress SARS-CoV-2/FLU/RSV plus assay is intended as an aid in the diagnosis of influenza from Nasopharyngeal swab specimens and should not be used as a sole basis for treatment. Nasal washings and aspirates are unacceptable for  Xpert Xpress SARS-CoV-2/FLU/RSV testing.  Fact Sheet for Patients: BloggerCourse.com  Fact Sheet for Healthcare Providers: SeriousBroker.it  This test is not yet approved or cleared by the [place] FDA and has been authorized for detection and/or diagnosis of SARS-CoV-2 by FDA under an Emergency Use Authorization (EUA). This EUA will remain in effect (meaning this test can be used) for the duration of the COVID-19 declaration under Section 564(b)(1) of the Act, 21  U.S.C. section 360bbb-3(b)(1), unless the authorization is terminated or revoked.  Performed at [facility] [address] [place], [place] [address]   Aerobic/Anaerobic Culture w Gram Stain (surgical/deep wound)     Status: None   Collection Time: 05/04/21  6:39 PM   Specimen: Abscess; Wound  Result Value Ref Range Status   Specimen Description   Final    ABSCESS Performed at [facility], [address], [place], [place] [address]    Special Requests   Final    NONE Performed at [facility], [address] [place], [place] [address]    Gram Stain   Final    NO WBC SEEN RARE GRAM VARIABLE ROD Performed at [facility], [address], [place], [place] [address]    Culture   Final    RARE STREPTOCOCCUS AGALACTIAE TESTING AGAINST S. AGALACTIAE NOT ROUTINELY PERFORMED DUE TO PREDICTABILITY OF AMP/PEN/VAN SUSCEPTIBILITY. FEW CANDIDA GLABRATA    Report Status 05/08/2021 FINAL  Final  Urine Culture     Status: None   Collection Time: 05/05/21 10:15 PM   Specimen: Urine, Random  Result Value Ref Range Status   Specimen Description   Final    URINE, RANDOM Performed at [facility] [address], [place], [place] [address]    Special Requests   Final    NONE Performed at [facility], [address], [place], [place] [address]    Culture   Final    NO GROWTH Performed at [facility], [address] [place], [place] [address]    Report Status 05/07/2021 FINAL  Final  Aerobic/Anaerobic Culture w Gram Stain (surgical/deep wound)     Status: None   Collection Time: 05/06/21  8:32 AM   Specimen: PATH Other; Tissue  Result Value Ref Range Status   Specimen Description   Final    FOOT RIGHT TISSUE Performed at [facility], [address], [place], [place] [address]    Special Requests   Final    NONE Performed at [facility], [address] [place], [place] [address]    Gram Stain   Final    FEW WBC PRESENT,BOTH PMN AND MONONUCLEAR RARE GRAM POSITIVE COCCI    Culture   Final    FEW STREPTOCOCCUS AGALACTIAE TESTING AGAINST S. AGALACTIAE NOT ROUTINELY PERFORMED DUE TO PREDICTABILITY OF AMP/PEN/VAN SUSCEPTIBILITY. NO ANAEROBES ISOLATED Performed at [facility], [address], [place], [place] [address]    Report Status 05/11/2021 FINAL  Final  Surgical pcr screen     Status: None   Collection Time: 05/09/21  9:30 AM   Specimen: Nasal Mucosa; Nasal Swab  Result Value Ref Range Status   MRSA, PCR NEGATIVE NEGATIVE Final   Staphylococcus aureus NEGATIVE NEGATIVE Final    Comment: (NOTE) The Xpert SA Assay (FDA approved for NASAL specimens in patients 55 years of age and older), is one component of a comprehensive surveillance program. It is not intended to diagnose infection nor to guide or monitor treatment. Performed at [facility], [address] [place], [place] [address]     Signed:  Brendia Sacks MD.  [facility] 05/11/2021, 4:46 PM

## 2021-05-11 NOTE — TOC Progression Note (Addendum)
Transition of Care [facility]) - Progression Note    Patient Details  Name: Jesus Flowers MRN: [id number] Date of Birth: 24-Jul-1966  Transition of Care [facility]) CM/SW Contact  Marlowe Sax, RN Phone Number: 05/11/2021, 9:11 AM  Clinical Narrative:    TOC CM called [facility] the patient is not set up with them, I called [facility] and he is open with them ,  they confirmed that they prefer Lantus to provide for the patient as a Basil Insulin  The patient was provided with a Glucometer and strips  The patient will have a 3 in 1 delivered by [employer] from charity      Expected Discharge Plan and Services                                                 Social Determinants of Health (SDOH) Interventions    Readmission Risk Interventions No flowsheet data found.

## 2021-05-11 NOTE — Progress Notes (Signed)
Physical Therapy Treatment Patient Details Name: Jesus Flowers MRN: [id number] DOB: 09-28-1965 Today's Date: 05/11/2021   History of Present Illness Jesus Flowers is a 55 y.o. male with medical history significant of hypertension, hyperlipidemia, diabetes mellitus, depression, who presents with right foot wound infection. Pt states that he he stepped on something at work 1 week ago. He did not think it was a big deal. He developed pain and swelling in right foot over the past week, which has been progressively worsening. 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 NWB Right    PT Comments    Pt performed well today and continues to show good concept of weight bearing restrictions.  Pt expected to be d/c today to home.  No further complications or questions for therapist at this time.  Pt left with all needs met and call bell within reach.  Current discharge plans to home with no PT services remain appropriate at this time.  Pt will continue to benefit from skilled therapy in order to address deficits listed below.      Recommendations for follow up therapy are one component of a multi-disciplinary discharge planning process, led by the attending physician.  Recommendations may be updated based on patient status, additional functional criteria and insurance authorization.  Follow Up Recommendations  No PT follow up     Equipment Recommendations  None recommended by PT;Other (comment) (has RW in room)    Recommendations for Other Services       Precautions / Restrictions Precautions Precautions: Fall Restrictions Weight Bearing Restrictions: Yes RLE Weight Bearing: Non weight bearing Other Position/Activity Restrictions: delivered CAM boot to pt     Mobility  Bed Mobility Overal bed mobility: Independent                  Transfers Overall transfer level: Needs assistance Equipment used: Rolling walker (2 wheeled) Transfers: Sit to/from Stand Sit to Stand:  Supervision            Ambulation/Gait Ambulation/Gait assistance: Min guard Gait Distance (Feet): 40 Feet Assistive device: Rolling walker (2 wheeled) Gait Pattern/deviations: Step-to pattern Gait velocity: decr   General Gait Details: patient steady with ambulation using RW, NWB right   Stairs             Wheelchair Mobility    Modified Rankin (Stroke Patients Only)       Balance Overall balance assessment: Needs assistance Sitting-balance support: Feet unsupported Sitting balance-Leahy Scale: Normal     Standing balance support: During functional activity (forearm support) Standing balance-Leahy Scale: Good                              Cognition Arousal/Alertness: Awake/alert Behavior During Therapy: WFL for tasks assessed/performed Overall Cognitive Status: Within Functional Limits for tasks assessed                                        Exercises Other Exercises Other Exercises: Pt educated re: OT role, LB dressing with don/doff Cam boot, WB pcns, d/c recs, fall pcns Other Exercises: sit<>stand, ambulate to sink, simulated standing grooming task, don/doff cam boot xs and med    General Comments        Pertinent Vitals/Pain Pain Assessment: 0-10 Pain Score: 4  Pain Location: R foot Pain Descriptors / Indicators: Discomfort;Aching;Sore Pain Intervention(s): Limited activity within  patient's tolerance;Repositioned    Home Living Family/patient expects to be discharged to:: Private residence Living Arrangements: Children;Other relatives Available Help at Discharge: Family;Available 24 hours/day Type of Home: House Home Access: Stairs to enter Entrance Stairs-Rails: Right;Left;Can reach both Home Layout: One level        Prior Function Level of Independence: Independent          PT Goals (current goals can now be found in the care plan section) Acute Rehab PT Goals Patient Stated Goal: to go home, feel  better Progress towards PT goals: Progressing toward goals    Frequency    Min 5X/week      PT Plan      Co-evaluation              AM-PAC PT "6 Clicks" Mobility   Outcome Measure  Help needed turning from your back to your side while in a flat bed without using bedrails?: None Help needed moving from lying on your back to sitting on the side of a flat bed without using bedrails?: None Help needed moving to and from a bed to a chair (including a wheelchair)?: A Little Help needed standing up from a chair using your arms (e.g., wheelchair or bedside chair)?: A Little Help needed to walk in hospital room?: A Little Help needed climbing 3-5 steps with a railing? : A Little 6 Click Score: 20    End of Session Equipment Utilized During Treatment: Gait belt Activity Tolerance: Patient tolerated treatment well Patient left: in bed;with call bell/phone within reach Nurse Communication: Mobility status PT Visit Diagnosis: Difficulty in walking, not elsewhere classified (R26.2);Pain Pain - Right/Left: Right Pain - part of body: Ankle and joints of foot     Time: 3005-1102 PT Time Calculation (min) (ACUTE ONLY): 13 min  Charges:  $Gait Training: 8-22 mins                     Gwenlyn Saran, PT, DPT 05/11/21, 2:33 PM    Christie Nottingham 05/11/2021, 2:28 PM

## 2021-05-11 NOTE — Progress Notes (Signed)
  Subjective:  Patient ID: Jesus Flowers, male    DOB: 1965-12-26,  MRN: [id number]  A 55 y.o. male presents with right foot osteomyelitis status post transmetatarsal amputation.  Patient states is doing well.  Minimal pain.  He has been nonweightbearing to the right lower extremity.  He denies any nausea fever chills vomiting. Objective:   Vitals:   05/10/21 2031 05/11/21 0359  BP: (!) 142/88 135/80  Pulse: 82 79  Resp: 18 18  Temp: 98.7 F (37.1 C) 98.3 F (36.8 C)  SpO2: 99% 98%   General AA&O x3. Normal mood and affect.  Vascular Dorsalis pedis and posterior tibial pulses 2/4 bilat. Brisk capillary refill to all digits. Pedal hair present.  Neurologic Epicritic sensation grossly intact.  Dermatologic Dorsal flap is intact.  No signs of vascular compromise noted.  No clinical signs of infection noted.  No fluctuance noted.  No hematoma noted.  Orthopedic: MMT 5/5 in dorsiflexion, plantarflexion, inversion, and eversion. Normal joint ROM without pain or crepitus.     Assessment & Plan:  Patient was evaluated and treated and all questions answered.  Right foot osteomyelitis status post transmetatarsal amputation postop day 2 -All question concerns were addressed at bedside in extensive detail -Patient is cleared to be discharged from podiatric standpoint. -He would likely need 14 days of p.o. antibiotics (doxycycline) for skin and soft tissue prophylaxis -No further dressing change needed. -He will follow-up with Dr. Lilian Kapur within 1 week from discharge. -Nonweightbearing to the right lower extremity in cam boot   Candelaria Stagers, DPM  Accessible via secure chat for questions or concerns.

## 2021-05-11 NOTE — Progress Notes (Signed)
Inpatient Diabetes Program Recommendations  AACE/ADA: New Consensus Statement on Inpatient Glycemic Control (2015)  Target Ranges:  Prepandial:   less than 140 mg/dL      Peak postprandial:   less than 180 mg/dL (1-2 hours)      Critically ill patients:  140 - 180 mg/dL  Results for OLLIN, HOCHMUTH (MRN [id number]) as of 05/11/2021 08:28  Ref. Range 05/10/2021 08:22 05/10/2021 12:40 05/10/2021 16:42 05/10/2021 20:32 05/10/2021 20:42  Glucose-Capillary Latest Ref Range: 70 - 99 mg/dL 125 (H)  1 unit Novolog  101 (H)    8 units Semglee 100 (H) 114 (H) 105 (H)    8 units Semglee  Results for KEYLOR, RANDS (MRN [id number]) as of 05/11/2021 08:28  Ref. Range 05/11/2021 08:09  Glucose-Capillary Latest Ref Range: 70 - 99 mg/dL 74   Admit with: Sepsis/ Foot infection   History: DM2   Home DM Meds: Jardiance 25 mg daily     Janumet 50-1000 mg BID   Current Orders: Semglee 8 units BID                            Jardiance 25 mg daily                            Tradjenta 5 mg Daily                            Novolog Sensitive Correction Scale/ SSI (0-9 units) TID AC + HS    Met w/ pt yesterday 10/13 to review insulin pen for home.  Asked RNs to allow pt to give injections last PM for practice.   MD- Note CBG 74 this AM.  Please consider reducing Semglee to 5 units BID  Patient may do well at home with his oral meds and low dose basal insulin like Lantus or Levemir.  Will need to have TOC determine what Basal Insulin patient can get with assistance at the [facility] where he gets his care and medication assistance due to no insurance.     --Will follow patient during hospitalization--  Wyn Quaker RN, MSN, CDE Diabetes Coordinator Inpatient Glycemic Control Team Team Pager: [phone] (8a-5p)

## 2021-05-11 NOTE — Plan of Care (Signed)
  RD consulted for nutrition education regarding diabetes.   Visited pt in room, in-person interpreter assisted with interview. Pt reports he has received education about DM medication in the past, but has never talked to anyone about diet. Pt reports eating 2 meals a day, coffee and sweet bread for breakfast, and a sandwich or tacos for lunch. May have a snack of fruit in the afternoon. States he will drink protein drinks (Premier protein) 3-4 times a week in place of a meal. Overall inadequate intake of kcal and protein. Pt is thin on exam.   Lab Results  Component Value Date   HGBA1C 12.3 (H) 05/03/2021   RD provided "Carbohydrate Counting for People with Diabetes" handout from the Academy of [employer] and Dietetics. Discussed different food groups and their effects on blood sugar, emphasizing carbohydrate-containing foods. Provided list of carbohydrates and recommended serving sizes of common foods.  Discussed importance of controlled and consistent carbohydrate intake throughout the day. Provided examples of ways to balance meals/snacks and encouraged intake of high-fiber, whole grain complex carbohydrates. Teach back method used.  Expect fair compliance.  Body mass index is 24.52 kg/m. Pt meets criteria for normal weight based on current BMI.  Current diet order is carb modified, patient is consuming approximately 82% of meals at this time. Labs and medications reviewed. No further nutrition interventions warranted at this time. RD contact information provided. If additional nutrition issues arise, please re-consult RD.  Greig Castilla, RD, LDN Clinical Dietitian RD pager # available in [facility]  After hours/weekend pager # available in [facility]

## 2021-05-11 NOTE — Progress Notes (Signed)
Occupational Therapy Evaluation Patient Details Name: Jesus Flowers MRN: [id number] DOB: September 21, 1965 Today's Date: 05/11/2021   History of Present Illness Jesus Flowers is a 55 y.o. male with medical history significant of hypertension, hyperlipidemia, diabetes mellitus, depression, who presents with right foot wound infection. Pt states that he he stepped on something at work 1 week ago. He did not think it was a big deal. He developed pain and swelling in right foot over the past week, which has been progressively worsening. 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 NWB Right   Clinical Impression   [name] was seen for OT evaluation this date. Prior to hospital admission, pt was independent. Pt lives with his son and his wife. Currently pt demonstrates impairments as described below (See OT problem list) which functionally limit his ability to perform ADL/self-care tasks. Pt currently requires SBA + RW + VCs for functional mobility, ~ 61ft. SBA + RW + VCs for grooming task, standing, sinkside, needed VCs to maintain NWB status. Pt educated in cam boot donning, return demonstrated MOD I don/doff cam boot, sitting EOB, anticipate SUP for don/doff pants. Pt will benefit from repeated trials of ADL practice for home/routine modification. Pt return verbalized importance of NWB status and consequences of breaking NWB. Pt would benefit from skilled OT services to address noted impairments and functional limitations (see below for any additional details) in order to maximize safety and independence while minimizing falls risk and caregiver burden. Upon hospital discharge, no follow-up OT services recommended.      Recommendations for follow up therapy are one component of a multi-disciplinary discharge planning process, led by the attending physician.  Recommendations may be updated based on patient status, additional functional criteria and insurance authorization.   Follow Up Recommendations   No OT follow up    Equipment Recommendations  3 in 1 bedside commode    Recommendations for Other Services       Precautions / Restrictions Precautions Precautions: Fall Restrictions Weight Bearing Restrictions: Yes RLE Weight Bearing: Non weight bearing Other Position/Activity Restrictions: delivered CAM boot to pt      Mobility Bed Mobility Overal bed mobility: Independent                  Transfers Overall transfer level: Needs assistance Equipment used: Rolling walker (2 wheeled) Transfers: Sit to/from Stand Sit to Stand: Supervision              Balance Overall balance assessment: Needs assistance Sitting-balance support: Feet unsupported Sitting balance-Leahy Scale: Normal     Standing balance support: During functional activity (forearm support) Standing balance-Leahy Scale: Good                             ADL either performed or assessed with clinical judgement   ADL Overall ADL's : Needs assistance/impaired                                       General ADL Comments: Pt SBA + RW + VCs for functional mobility, ~ 80ft. SBA + RW + VCs for grooming task, standing, sinkside, needed VCs to maintain NWB status. MOD I for LBD, shoe don/doff, sitting EOB, anticipate SUP for LBD but needs VCs.      Pertinent Vitals/Pain Pain Assessment: 0-10 Pain Score: 6  Pain Location: R foot Pain Descriptors / Indicators:  Discomfort;Aching;Sore Pain Intervention(s): Limited activity within patient's tolerance;Repositioned     Hand Dominance     Extremity/Trunk Assessment Upper Extremity Assessment Upper Extremity Assessment: Overall WFL for tasks assessed   Lower Extremity Assessment Lower Extremity Assessment: RLE deficits/detail RLE Deficits / Details: s/p transmetatarsal apmutation       Communication Communication Communication: Prefers language other than English (pt refused interpreter)   Cognition  Arousal/Alertness: Awake/alert Behavior During Therapy: WFL for tasks assessed/performed Overall Cognitive Status: Within Functional Limits for tasks assessed                                     General Comments       Exercises Exercises: Other exercises Other Exercises Other Exercises: Pt educated re: OT role, LB dressing with don/doff Cam boot, WB pcns, d/c recs, fall pcns Other Exercises: sit<>stand, ambulate to sink, simulated standing grooming task, don/doff cam boot xs and med   Shoulder Instructions      Home Living Family/patient expects to be discharged to:: Private residence Living Arrangements: Children;Other relatives Available Help at Discharge: Family;Available 24 hours/day Type of Home: House Home Access: Stairs to enter [employer] of Steps: 3 Entrance Stairs-Rails: Right;Left;Can reach both Home Layout: One level     Bathroom Shower/Tub: Producer, television/film/video: Standard                Prior Functioning/Environment Level of Independence: Independent                 OT Problem List: Decreased safety awareness;Decreased knowledge of use of DME or AE;Decreased knowledge of precautions;Decreased activity tolerance      OT Treatment/Interventions: Self-care/ADL training;Therapeutic activities;DME and/or AE instruction;Energy conservation    OT Goals(Current goals can be found in the care plan section) Acute Rehab OT Goals Patient Stated Goal: to go home, feel better OT Goal Formulation: With patient Time For Goal Achievement: 05/25/21 Potential to Achieve Goals: Good ADL Goals Pt Will Perform Grooming: standing;with modified independence Pt Will Perform Lower Body Dressing: with modified independence;sit to/from stand (w/ LRAD prn) Pt Will Transfer to Toilet: with modified independence;bedside commode;regular height toilet;ambulating (w/ LRAD prn)  OT Frequency: Min 1X/week   Barriers to D/C:             Co-evaluation              AM-PAC OT "6 Clicks" Daily Activity     Outcome Measure Help from another person eating meals?: None Help from another person taking care of personal grooming?: A Little Help from another person toileting, which includes using toliet, bedpan, or urinal?: A Little Help from another person bathing (including washing, rinsing, drying)?: A Little Help from another person to put on and taking off regular upper body clothing?: None Help from another person to put on and taking off regular lower body clothing?: A Little 6 Click Score: 20   End of Session Equipment Utilized During Treatment: Rolling walker (post op shoe, cam boot)  Activity Tolerance: Patient tolerated treatment well Patient left: in bed;with call bell/phone within reach (pt requested no bed alarm)  OT Visit Diagnosis: Unsteadiness on feet (R26.81);Other abnormalities of gait and mobility (R26.89)                Time: 4503-8882 OT Time Calculation (min): 23 min Charges:  OT General Charges $OT Visit: 1 Visit OT Evaluation $OT Eval Low Complexity: 1  Low OT Treatments $Self Care/Home Management : 8-22 mins  [place] [facility], Adine Madura 05/11/2021, 12:36 PM

## 2021-05-11 NOTE — Progress Notes (Signed)
Initial Nutrition Assessment  DOCUMENTATION CODES:  Non-severe (moderate) malnutrition in context of social or environmental circumstances  INTERVENTION:  Continue current diet as ordered, encourage PO intake. Nepro Shake po BID, each supplement provides 425 kcal and 19 grams protein Ensure Max po 1x/d, each supplement provides 150 kcal and 30 grams of protein.  MVI with minerals daily Juven BID, each packet provides 95 calories, 2.5 grams of protein (collagen), and 9.8 grams of carbohydrate (3 grams sugar); also contains 7 grams of L-arginine and L-glutamine, 300 mg vitamin C, 15 mg vitamin E, 1.2 mcg vitamin B-12, 9.5 mg zinc, 200 mg calcium, and 1.5 g  Calcium Beta-hydroxy-Beta-methylbutyrate to support wound healing  NUTRITION DIAGNOSIS:  Moderate Malnutrition related to social / environmental circumstances (inadequate oral intake) as evidenced by mild muscle depletion, mild fat depletion, energy intake < 75% for > or equal to 3 months.  GOAL:  Patient will meet greater than or equal to 90% of their needs  MONITOR:  PO intake, Supplement acceptance, Skin, Labs  REASON FOR ASSESSMENT:  Consult Assessment of nutrition requirement/status, Diet education  ASSESSMENT:  55 y.o. male with history of depression, diabetes, and hyperlipidemia, presented to ED for worsening right foot swelling after stepping on something at work ~1 week PTA. Pt reports he developed nausea/vomiting and fever in the last few days prior to admission.  Pt found to be septic in ED related to great right toe infection. MRI did not suggest abscess or osteomyelitis. Podiatry consulted and performed bedside debridement and found severe amounts of skin and myonecrosis around the wounds 10/7. Pt's WBC continued to climb and was taken to OR 10/9 for more extensive wound washout where an abscess was found in the dorsal subcutaneous tissues. Poor wound healing and toes maintained a dusky appearance. Ultimately taken to OR for  TMA of the right foot on 10/12.   Pt resting in bed at the time of visit, interview conducted with interpreter. Pt reports that appetite is at baseline. Pt reports eating 2 meals a day at home, coffee and sweet bread for breakfast, and a ham sandwich or tacos for lunch. May have a snack of fruit in the afternoon. States he will drink protein drinks (Premier protein) 3-4 times a week in place of a meal. Overall inadequate intake of kcal and protein. Pt is thin on exam.    Discussed the importance of protein and adequate energy intake for wound healing. Discussed protein foods and recommended adding to each meal. Will add nutrition supplements for patient and vitamin regimen to encourage wound healing.  Pt will be starting insulin at dc, talked about preventing hypoglycemia and the importance of eating adequate carbs throughout the day.  Average Meal Intake: 10/7-10/13: 83% intake x 9 recorded meals  Nutritionally Relevant Medications: Scheduled Meds:  empagliflozin  25 mg Oral Daily   insulin aspart  0-5 Units Subcutaneous QHS   insulin aspart  0-9 Units Subcutaneous TID WC   insulin glargine-yfgn  5 Units Subcutaneous BID   linagliptin  5 mg Oral Daily   Continuous Infusions:  ampicillin-sulbactam (UNASYN) IV 3 g (05/11/21 0639)   PRN Meds: ondansetron  Labs Reviewed: Na 134 SBG ranges from 74-125 mg/dL over the last 24 hours HgbA1c 12.3% (10/6)  NUTRITION - FOCUSED PHYSICAL EXAM: Flowsheet Row Most Recent Value  Orbital Region Mild depletion  Upper Arm Region Moderate depletion  Thoracic and Lumbar Region Mild depletion  Buccal Region Mild depletion  Temple Region Mild depletion  Clavicle Bone Region No  depletion  Clavicle and Acromion Bone Region No depletion  Scapular Bone Region No depletion  Dorsal Hand Mild depletion  Patellar Region Moderate depletion  Anterior Thigh Region Moderate depletion  Posterior Calf Region Severe depletion  Edema (RD Assessment) Mild  [right  leg - from surgery]  Hair Reviewed  Eyes Reviewed  Mouth Reviewed  Skin Reviewed  Nails Reviewed   Diet Order:   Diet Order             Diet Carb Modified           Diet Carb Modified Fluid consistency: Thin; Room service appropriate? Yes  Diet effective now                  EDUCATION NEEDS:  Education needs have been addressed  Skin:  Skin Assessment: Reviewed RN Assessment (surgical incision - right foot)  Last BM:  10/13 - type 4  Height:  Ht Readings from Last 1 Encounters:  05/09/21 5\' 6"  (1.676 m)   Weight:  Wt Readings from Last 1 Encounters:  05/09/21 68.9 kg    Ideal Body Weight:  64.5 kg  BMI:  Body mass index is 24.52 kg/m.  Estimated Nutritional Needs:  Kcal:  1900-2100 kcal/d Protein:  95-110 g/d Fluid:  2-2.2L/d   07/09/21, RD, LDN Clinical Dietitian RD pager # available in [facility]  After hours/weekend pager # available in [facility]

## 2021-05-14 LAB — SURGICAL PATHOLOGY

## 2021-05-21 ENCOUNTER — Encounter: Payer: Self-pay | Admitting: Podiatry

## 2021-05-21 NOTE — Anesthesia Postprocedure Evaluation (Signed)
Anesthesia Post Note  Patient: Jesus Flowers  Procedure(s) Performed: TRANSMETATARSAL AMPUTATION (Right: Toe)  Patient location during evaluation: PACU Anesthesia Type: General Level of consciousness: awake and alert Pain management: pain level controlled Vital Signs Assessment: post-procedure vital signs reviewed and stable Respiratory status: spontaneous breathing, nonlabored ventilation, respiratory function stable and patient connected to nasal cannula oxygen Cardiovascular status: blood pressure returned to baseline and stable Postop Assessment: no apparent nausea or vomiting Anesthetic complications: no   No notable events documented.   Last Vitals:  Vitals:   05/11/21 0745 05/11/21 1156  BP: (!) 143/72 (!) 139/98  Pulse: 80 83  Resp: 18 16  Temp: 36.7 C 36.7 C  SpO2: 99% 100%    Last Pain:  Vitals:   05/11/21 0910  TempSrc:   PainSc: 5                  Corinda Gubler

## 2021-06-20 ENCOUNTER — Ambulatory Visit (INDEPENDENT_AMBULATORY_CARE_PROVIDER_SITE_OTHER): Payer: Self-pay | Admitting: Podiatry

## 2021-06-20 DIAGNOSIS — Z91199 Patient's noncompliance with other medical treatment and regimen due to unspecified reason: Secondary | ICD-10-CM

## 2021-06-20 NOTE — Progress Notes (Signed)
Patient no-show for appt

## 2021-08-12 ENCOUNTER — Inpatient Hospital Stay
Admission: EM | Admit: 2021-08-12 | Discharge: 2021-08-17 | DRG: 617 | Disposition: A | Discharge status: Home Health | Payer: Self-pay | Attending: Hospitalist | Admitting: Hospitalist

## 2021-08-12 ENCOUNTER — Emergency Department: Payer: Self-pay

## 2021-08-12 ENCOUNTER — Other Ambulatory Visit: Payer: Self-pay

## 2021-08-12 ENCOUNTER — Inpatient Hospital Stay: Payer: Self-pay

## 2021-08-12 DIAGNOSIS — E871 Hypo-osmolality and hyponatremia: Secondary | ICD-10-CM | POA: Diagnosis present

## 2021-08-12 DIAGNOSIS — M86171 Other acute osteomyelitis, right ankle and foot: Secondary | ICD-10-CM | POA: Diagnosis present

## 2021-08-12 DIAGNOSIS — L02611 Cutaneous abscess of right foot: Secondary | ICD-10-CM | POA: Diagnosis present

## 2021-08-12 DIAGNOSIS — E1169 Type 2 diabetes mellitus with other specified complication: Secondary | ICD-10-CM | POA: Diagnosis present

## 2021-08-12 DIAGNOSIS — E1100 Type 2 diabetes mellitus with hyperosmolarity without nonketotic hyperglycemic-hyperosmolar coma (NKHHC): Principal | ICD-10-CM | POA: Diagnosis present

## 2021-08-12 DIAGNOSIS — F419 Anxiety disorder, unspecified: Secondary | ICD-10-CM | POA: Diagnosis present

## 2021-08-12 DIAGNOSIS — Z597 Insufficient social insurance and welfare support: Secondary | ICD-10-CM

## 2021-08-12 DIAGNOSIS — R739 Hyperglycemia, unspecified: Secondary | ICD-10-CM

## 2021-08-12 DIAGNOSIS — M869 Osteomyelitis, unspecified: Secondary | ICD-10-CM | POA: Diagnosis present

## 2021-08-12 DIAGNOSIS — E1165 Type 2 diabetes mellitus with hyperglycemia: Secondary | ICD-10-CM

## 2021-08-12 DIAGNOSIS — Z833 Family history of diabetes mellitus: Secondary | ICD-10-CM

## 2021-08-12 DIAGNOSIS — F32A Depression, unspecified: Secondary | ICD-10-CM | POA: Diagnosis present

## 2021-08-12 DIAGNOSIS — Z20822 Contact with and (suspected) exposure to covid-19: Secondary | ICD-10-CM | POA: Diagnosis present

## 2021-08-12 DIAGNOSIS — L089 Local infection of the skin and subcutaneous tissue, unspecified: Secondary | ICD-10-CM

## 2021-08-12 DIAGNOSIS — Z79899 Other long term (current) drug therapy: Secondary | ICD-10-CM

## 2021-08-12 DIAGNOSIS — Z89421 Acquired absence of other right toe(s): Secondary | ICD-10-CM

## 2021-08-12 DIAGNOSIS — Z794 Long term (current) use of insulin: Secondary | ICD-10-CM

## 2021-08-12 DIAGNOSIS — E7800 Pure hypercholesterolemia, unspecified: Secondary | ICD-10-CM | POA: Diagnosis present

## 2021-08-12 DIAGNOSIS — A419 Sepsis, unspecified organism: Secondary | ICD-10-CM

## 2021-08-12 DIAGNOSIS — I10 Essential (primary) hypertension: Secondary | ICD-10-CM | POA: Diagnosis present

## 2021-08-12 DIAGNOSIS — Z09 Encounter for follow-up examination after completed treatment for conditions other than malignant neoplasm: Secondary | ICD-10-CM

## 2021-08-12 DIAGNOSIS — E870 Hyperosmolality and hypernatremia: Secondary | ICD-10-CM

## 2021-08-12 LAB — COMPREHENSIVE METABOLIC PANEL
ALT: 22 U/L (ref 0–44)
AST: 28 U/L (ref 15–41)
Albumin: 3.2 g/dL — ABNORMAL LOW (ref 3.5–5.0)
Alkaline Phosphatase: 225 U/L — ABNORMAL HIGH (ref 38–126)
Anion gap: 11 (ref 5–15)
BUN: 28 mg/dL — ABNORMAL HIGH (ref 6–20)
CO2: 23 mmol/L (ref 22–32)
Calcium: 8.8 mg/dL — ABNORMAL LOW (ref 8.9–10.3)
Chloride: 86 mmol/L — ABNORMAL LOW (ref 98–111)
Creatinine, Ser: 1.13 mg/dL (ref 0.61–1.24)
GFR, Estimated: >60 mL/min (ref >60)
Glucose, Bld: 709 mg/dL (ref 70–99)
Potassium: 4.5 mmol/L (ref 3.5–5.1)
Sodium: 120 mmol/L — ABNORMAL LOW (ref 135–145)
Total Bilirubin: 1 mg/dL (ref 0.3–1.2)
Total Protein: 8.1 g/dL (ref 6.5–8.1)

## 2021-08-12 LAB — CBC WITH DIFFERENTIAL/PLATELET
Abs Immature Granulocytes: 0.12 10*3/uL — ABNORMAL HIGH (ref 0.00–0.07)
Basophils Absolute: 0.1 10*3/uL (ref 0.0–0.1)
Basophils Relative: 0 %
Eosinophils Absolute: 0 10*3/uL (ref 0.0–0.5)
Eosinophils Relative: 0 %
HCT: 34.9 % — ABNORMAL LOW (ref 39.0–52.0)
Hemoglobin: 11.9 g/dL — ABNORMAL LOW (ref 13.0–17.0)
Immature Granulocytes: 1 %
Lymphocytes Relative: 3 %
Lymphs Abs: 0.6 10*3/uL — ABNORMAL LOW (ref 0.7–4.0)
MCH: 31.4 pg (ref 26.0–34.0)
MCHC: 34.1 g/dL (ref 30.0–36.0)
MCV: 92.1 fL (ref 80.0–100.0)
Monocytes Absolute: 0.9 10*3/uL (ref 0.1–1.0)
Monocytes Relative: 5 %
Neutro Abs: 19.3 10*3/uL — ABNORMAL HIGH (ref 1.7–7.7)
Neutrophils Relative %: 91 %
Platelets: 392 10*3/uL (ref 150–400)
RBC: 3.79 MIL/uL — ABNORMAL LOW (ref 4.22–5.81)
RDW: 12 % (ref 11.5–15.5)
WBC: 21 10*3/uL — ABNORMAL HIGH (ref 4.0–10.5)
nRBC: 0 % (ref 0.0–0.2)

## 2021-08-12 LAB — BASIC METABOLIC PANEL
Anion gap: 9 (ref 5–15)
BUN: 26 mg/dL — ABNORMAL HIGH (ref 6–20)
CO2: 25 mmol/L (ref 22–32)
Calcium: 8.6 mg/dL — ABNORMAL LOW (ref 8.9–10.3)
Chloride: 94 mmol/L — ABNORMAL LOW (ref 98–111)
Creatinine, Ser: 1.32 mg/dL — ABNORMAL HIGH (ref 0.61–1.24)
GFR, Estimated: >60 mL/min (ref >60)
Glucose, Bld: 255 mg/dL — ABNORMAL HIGH (ref 70–99)
Potassium: 4 mmol/L (ref 3.5–5.1)
Sodium: 128 mmol/L — ABNORMAL LOW (ref 135–145)

## 2021-08-12 LAB — CBC
HCT: 30 % — ABNORMAL LOW (ref 39.0–52.0)
Hemoglobin: 10.6 g/dL — ABNORMAL LOW (ref 13.0–17.0)
MCH: 32.1 pg (ref 26.0–34.0)
MCHC: 35.3 g/dL (ref 30.0–36.0)
MCV: 90.9 fL (ref 80.0–100.0)
Platelets: 366 10*3/uL (ref 150–400)
RBC: 3.3 MIL/uL — ABNORMAL LOW (ref 4.22–5.81)
RDW: 11.9 % (ref 11.5–15.5)
WBC: 20.1 10*3/uL — ABNORMAL HIGH (ref 4.0–10.5)
nRBC: 0 % (ref 0.0–0.2)

## 2021-08-12 LAB — CBG MONITORING, ED
Glucose-Capillary: 265 mg/dL — ABNORMAL HIGH (ref 70–99)
Glucose-Capillary: 177 mg/dL — ABNORMAL HIGH (ref 70–99)

## 2021-08-12 LAB — CREATININE, SERUM
Creatinine, Ser: 1.18 mg/dL (ref 0.61–1.24)
GFR, Estimated: >60 mL/min (ref >60)

## 2021-08-12 LAB — LACTIC ACID, PLASMA
Lactic Acid, Venous: 1.8 mmol/L (ref 0.5–1.9)
Lactic Acid, Venous: 2 mmol/L (ref 0.5–1.9)

## 2021-08-12 IMAGING — CR DG FOOT COMPLETE 3+V*R*
3 series · 3 of 3 positions shown · non-contrast
Comparison: [DATE]

CLINICAL DATA: Open wound in the skin, pain

EXAM:
RIGHT FOOT COMPLETE - 3+ VIEW

[foot ap]
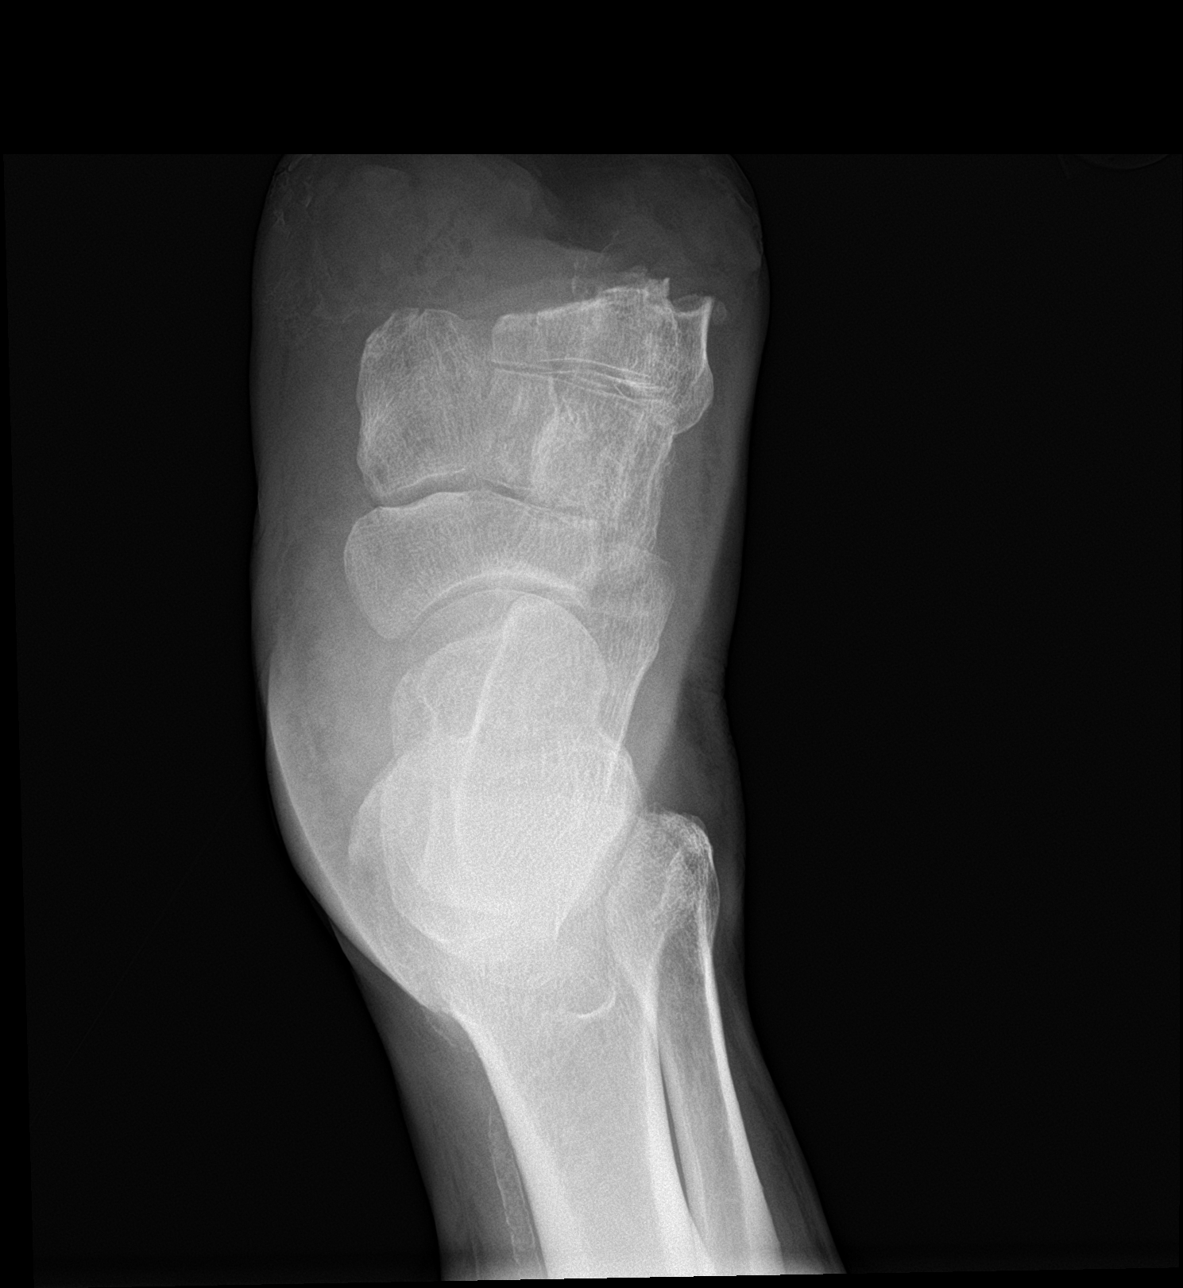

[foot obl]
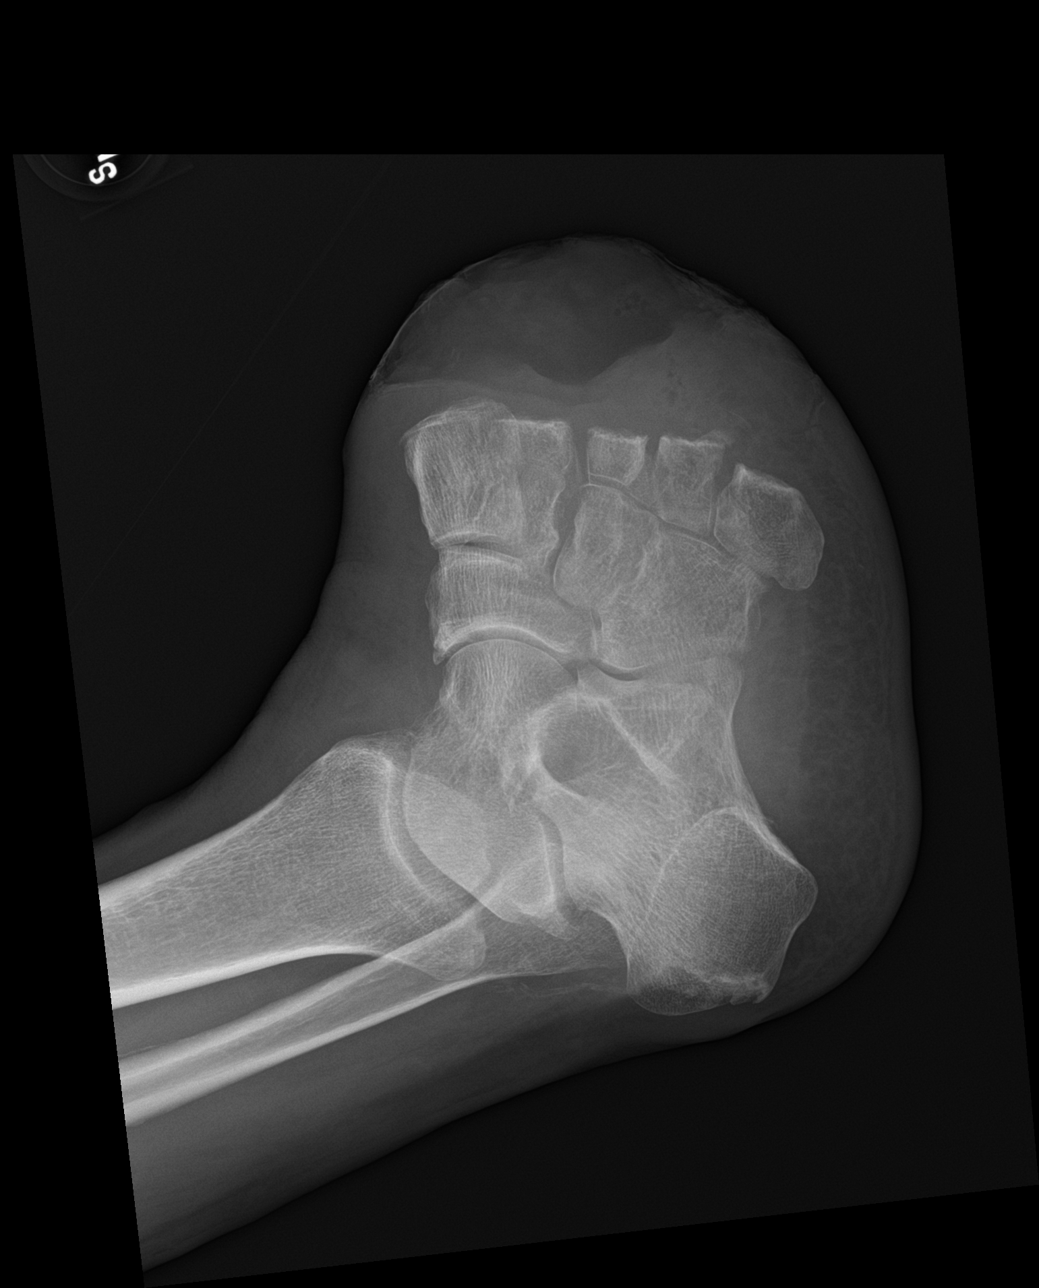

[foot lat]
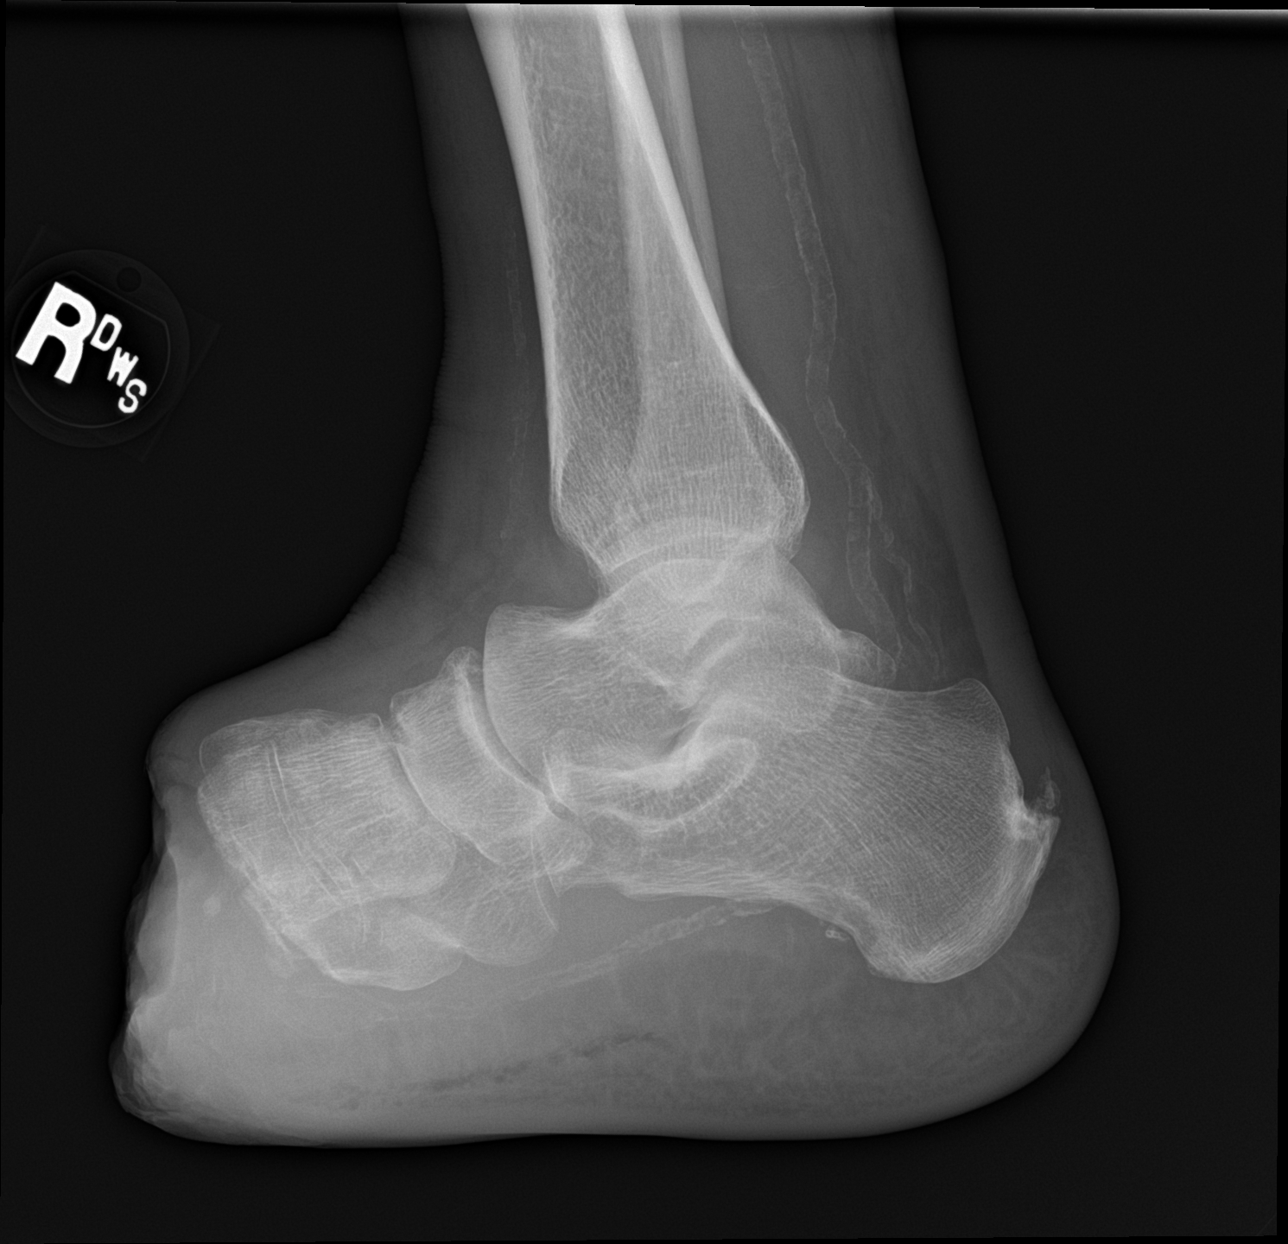

[3 of 3 positions shown; findings below may reference images not displayed]

FINDINGS: There is interval transmetatarsal amputation of right foot. No
recent fracture or dislocation is seen. There are no focal lytic
lesions. Arterial calcifications are seen in the soft tissues. In
the lateral view, there is linear area of low attenuation in the
subcutaneous plane in the plantar aspect of the foot. Bony spurs
seen in the talonavicular joint. Small smooth marginated
calcifications at the attachment of Achilles tendon to the calcaneus
may suggest calcific bursitis or tendinosis.
IMPRESSION: Interval transmetatarsal amputation of right foot. No recent
fracture or dislocation is seen. There is no focal lytic lesion.

There is linear area of low attenuation in the soft tissues along
the plantar aspect of right foot. Possibility of infectious process
in the soft tissues is not excluded.

Other findings as described in the body of the report.

## 2021-08-12 MED ORDER — SODIUM CHLORIDE 0.9 % IV SOLN: 250.0000 mL | INTRAVENOUS | Status: DC | PRN

## 2021-08-12 MED ORDER — SODIUM CHLORIDE 0.9% FLUSH
3.0000 mL | Freq: Two times a day (BID) | INTRAVENOUS | Status: DC
  Administered 2021-08-12 – 2021-08-16 (×7): 3 mL via INTRAVENOUS

## 2021-08-12 MED ORDER — ONDANSETRON HCL 4 MG/2ML IJ SOLN: 4.0000 mg | Freq: Four times a day (QID) | INTRAMUSCULAR | Status: DC | PRN

## 2021-08-12 MED ORDER — VANCOMYCIN HCL 1500 MG/300ML IV SOLN
1500.0000 mg | INTRAVENOUS | Status: DC
  Filled 2021-08-12: qty 300

## 2021-08-12 MED ORDER — SODIUM CHLORIDE 0.9% FLUSH
3.0000 mL | Freq: Two times a day (BID) | INTRAVENOUS | Status: DC
  Administered 2021-08-12 – 2021-08-16 (×6): 3 mL via INTRAVENOUS

## 2021-08-12 MED ORDER — SODIUM CHLORIDE 0.9 % IV BOLUS
1000.0000 mL | Freq: Once | INTRAVENOUS | Status: AC
  Administered 2021-08-12: 1000 mL via INTRAVENOUS

## 2021-08-12 MED ORDER — INSULIN GLARGINE-YFGN 100 UNIT/ML ~~LOC~~ SOLN
6.0000 [IU] | Freq: Every day | SUBCUTANEOUS | Status: DC
  Administered 2021-08-12 – 2021-08-13 (×2): 6 [IU] via SUBCUTANEOUS
  Filled 2021-08-12 (×3): qty 0.06

## 2021-08-12 MED ORDER — VANCOMYCIN HCL 500 MG/100ML IV SOLN
500.0000 mg | INTRAVENOUS | Status: AC
  Administered 2021-08-13: 500 mg via INTRAVENOUS
  Filled 2021-08-12: qty 100

## 2021-08-12 MED ORDER — FLUOXETINE HCL 20 MG PO CAPS
40.0000 mg | ORAL_CAPSULE | Freq: Every day | ORAL | Status: DC
  Administered 2021-08-15 – 2021-08-17 (×3): 40 mg via ORAL
  Filled 2021-08-12 (×5): qty 2

## 2021-08-12 MED ORDER — PRAVASTATIN SODIUM 20 MG PO TABS
20.0000 mg | ORAL_TABLET | Freq: Every day | ORAL | Status: DC
  Administered 2021-08-14 – 2021-08-16 (×3): 20 mg via ORAL
  Filled 2021-08-12 (×3): qty 1

## 2021-08-12 MED ORDER — INSULIN ASPART 100 UNIT/ML IJ SOLN
10.0000 [IU] | Freq: Once | INTRAMUSCULAR | Status: AC
  Administered 2021-08-12: 10 [IU] via INTRAVENOUS
  Filled 2021-08-12: qty 1

## 2021-08-12 MED ORDER — INSULIN ASPART 100 UNIT/ML IJ SOLN
0.0000 [IU] | Freq: Every day | INTRAMUSCULAR | Status: DC
  Administered 2021-08-13: 3 [IU] via SUBCUTANEOUS
  Administered 2021-08-14: 5 [IU] via SUBCUTANEOUS
  Filled 2021-08-12 (×2): qty 1

## 2021-08-12 MED ORDER — OXYCODONE-ACETAMINOPHEN 5-325 MG PO TABS
1.0000 | ORAL_TABLET | Freq: Four times a day (QID) | ORAL | Status: DC | PRN
  Administered 2021-08-12 – 2021-08-14 (×3): 2 via ORAL
  Administered 2021-08-14: 1 via ORAL
  Administered 2021-08-15 (×3): 2 via ORAL
  Administered 2021-08-16: 1 via ORAL
  Administered 2021-08-16 (×3): 2 via ORAL
  Filled 2021-08-12: qty 1
  Filled 2021-08-12 (×6): qty 2
  Filled 2021-08-12: qty 1
  Filled 2021-08-12 (×4): qty 2

## 2021-08-12 MED ORDER — INSULIN ASPART 100 UNIT/ML IJ SOLN
10.0000 [IU] | Freq: Once | INTRAMUSCULAR | Status: DC
  Filled 2021-08-12: qty 1

## 2021-08-12 MED ORDER — VANCOMYCIN HCL IN DEXTROSE 1-5 GM/200ML-% IV SOLN
1000.0000 mg | Freq: Once | INTRAVENOUS | Status: AC
  Administered 2021-08-12: 1000 mg via INTRAVENOUS
  Filled 2021-08-12: qty 200

## 2021-08-12 MED ORDER — ONDANSETRON HCL 4 MG PO TABS: 4.0000 mg | ORAL_TABLET | Freq: Four times a day (QID) | ORAL | Status: DC | PRN

## 2021-08-12 MED ORDER — ENOXAPARIN SODIUM 40 MG/0.4ML IJ SOSY
40.0000 mg | PREFILLED_SYRINGE | INTRAMUSCULAR | Status: DC
  Administered 2021-08-14 – 2021-08-16 (×3): 40 mg via SUBCUTANEOUS
  Filled 2021-08-12 (×4): qty 0.4

## 2021-08-12 MED ORDER — PIPERACILLIN-TAZOBACTAM 3.375 G IVPB 30 MIN
3.3750 g | Freq: Once | INTRAVENOUS | Status: AC
  Administered 2021-08-12: 3.375 g via INTRAVENOUS
  Filled 2021-08-12: qty 50

## 2021-08-12 MED ORDER — LISINOPRIL 10 MG PO TABS
10.0000 mg | ORAL_TABLET | Freq: Every day | ORAL | Status: DC
  Administered 2021-08-12 – 2021-08-17 (×4): 10 mg via ORAL
  Filled 2021-08-12 (×4): qty 1

## 2021-08-12 MED ORDER — PIPERACILLIN-TAZOBACTAM 3.375 G IVPB 30 MIN
3.3750 g | Freq: Three times a day (TID) | INTRAVENOUS | Status: DC
  Administered 2021-08-12 – 2021-08-16 (×10): 3.375 g via INTRAVENOUS
  Filled 2021-08-12 (×23): qty 50

## 2021-08-12 MED ORDER — OXYCODONE-ACETAMINOPHEN 5-325 MG PO TABS
1.0000 | ORAL_TABLET | ORAL | Status: DC | PRN
  Administered 2021-08-12: 1 via ORAL
  Filled 2021-08-12 (×2): qty 1

## 2021-08-12 MED ORDER — MORPHINE SULFATE (PF) 2 MG/ML IV SOLN
2.0000 mg | INTRAVENOUS | Status: DC | PRN
  Administered 2021-08-12 – 2021-08-15 (×11): 2 mg via INTRAVENOUS
  Filled 2021-08-12 (×11): qty 1

## 2021-08-12 MED ORDER — SODIUM CHLORIDE 0.9% FLUSH
3.0000 mL | INTRAVENOUS | Status: DC | PRN
  Administered 2021-08-14: 3 mL via INTRAVENOUS

## 2021-08-12 MED ORDER — INSULIN ASPART 100 UNIT/ML IJ SOLN
0.0000 [IU] | Freq: Three times a day (TID) | INTRAMUSCULAR | Status: DC
  Administered 2021-08-12: 8 [IU] via SUBCUTANEOUS
  Administered 2021-08-13: 15 [IU] via SUBCUTANEOUS
  Administered 2021-08-13 (×2): 3 [IU] via SUBCUTANEOUS
  Administered 2021-08-14: 8 [IU] via SUBCUTANEOUS
  Administered 2021-08-14: 15 [IU] via SUBCUTANEOUS
  Administered 2021-08-15 (×2): 5 [IU] via SUBCUTANEOUS
  Administered 2021-08-16 (×2): 2 [IU] via SUBCUTANEOUS
  Administered 2021-08-17: 3 [IU] via SUBCUTANEOUS
  Filled 2021-08-12 (×10): qty 1

## 2021-08-12 MED ORDER — INSULIN ASPART 100 UNIT/ML IJ SOLN
3.0000 [IU] | Freq: Three times a day (TID) | INTRAMUSCULAR | Status: DC
  Administered 2021-08-13 (×3): 3 [IU] via SUBCUTANEOUS
  Filled 2021-08-12 (×2): qty 1

## 2021-08-12 NOTE — ED Triage Notes (Addendum)
Pt to ER via POV with complaints of intermittent right foot pain x2 months. Describes pain as sharp and shooting, worse when standing/ trying to walk.  Pt diabetic. All toes on affected foot amputated approx 2-3 months ago.   Wound to end of right foot, large with slough present, some necrotic tissue to border of wound.

## 2021-08-12 NOTE — H&P (View-Only) (Signed)
PODIATRY CONSULTATION  NAME Jesus Flowers MRN [id number] DOB Dec 13, 1965 DOA 08/12/2021   Reason for consult: OM RT foot Chief Complaint  Patient presents with   Toe Pain    Consulting physician: Marjean Donna MD  History of present illness: 56 y.o. male PmHx uncontrolled DM presenting for worsening infection right foot amputation stump. Patient had transmetatarsal amputation on 05/09/2021 while inpatient. Never had outpatient follow-up because patient had no insurance and concerned for cost. He states that he rested the foot for about 3-4 days after surgery and then went back to work. He did apparently [facility]. Patient states that over the past 3-4 days he began having increased pain and tenderness with drainage to the right foot amputation stump. He came to the hospital for further treatment and evaluation.   Past Medical History:  Diagnosis Date   Depression    Diabetes mellitus without complication (HCC)    High cholesterol     CBC Latest Ref Rng & Units 08/12/2021 08/12/2021 05/09/2021  WBC 4.0 - 10.5 K/uL 20.1(H) 21.0(H) 12.2(H)  Hemoglobin 13.0 - 17.0 g/dL 10.6(L) 11.9(L) 10.0(L)  Hematocrit 39.0 - 52.0 % 30.0(L) 34.9(L) 29.2(L)  Platelets 150 - 400 K/uL 366 392 379    BMP Latest Ref Rng & Units 08/12/2021 08/12/2021 05/08/2021  Glucose 70 - 99 mg/dL - 709(HH) 111(H)  BUN 6 - 20 mg/dL - 28(H) 16  Creatinine 0.61 - 1.24 mg/dL 1.18 1.13 0.94  Sodium 135 - 145 mmol/L - 120(L) 134(L)  Potassium 3.5 - 5.1 mmol/L - 4.5 4.1  Chloride 98 - 111 mmol/L - 86(L) 103  CO2 22 - 32 mmol/L - 23 21(L)  Calcium 8.9 - 10.3 mg/dL - 8.8(L) 8.6(L)         Physical Exam: General: The patient is alert and oriented x3 in no acute distress.   Dermatology: Large open granular and fibrotic wound to the amputation stump of the right foot. No strong malodor. Minimal drainage.   Vascular: Erythema and edema noted to the foot up to the level of the ankle.  Pulses palpable. Skin is warm to touch.   Neurological: Light touch and protective threshold diminished.  Musculoskeletal Exam: S/p right transmetatarsal amputation 05/09/2021.  XR foot RT today 08/12/2021: IMPRESSION: Interval transmetatarsal amputation of right foot. No recent fracture or dislocation is seen. There is no focal lytic lesion.   There is linear area of low attenuation in the soft tissues along the plantar aspect of right foot. Possibility of infectious process in the soft tissues is not excluded.   Other findings as described in the body of the report.  MR ANKLE RIGHT WO CONTRAST today 08/12/2021: IMPRESSION: 1. Postsurgical changes of transmetatarsal amputation of the right foot. Findings of acute osteomyelitis involving the residual bases of the second and third metatarsals. 2. Probable early acute osteomyelitis involving the bases of the fourth and fifth metatarsals. 3. Bone marrow edema within the medial cuneiform may be reactive. 4. Extensive soft tissue swelling and edema. No organized fluid collection. 5. Diffuse edema-like signal throughout the visualized musculature, may reflect myositis or denervation.  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 MRI and XR reviewed.  - patient will require revisional amputation of the foot for limb salvage likely at the level of the TarsoMetatarsal Joint. This was explained to the patient and he is amenable to this.  - Preoperative orders placed.  - Surgery tentatively planned for  tomorrow, 08/13/2021, after 5PM. NPO after full breakfast.  - Continue IV abx as per admitting physician - Podiatry will follow   Thank you for the consult.     Edrick Kins, DPM [facility]  Dr. Edrick Kins, DPM    [address], [place] [address]                Office [phone]  Fax [phone]

## 2021-08-12 NOTE — H&P (Signed)
History and Physical    Jesus Goldenrmando L Ciotti [id number] DOB: 04/20/1966 DOA: 08/12/2021  [facility]    Patient coming from: Home    Chief Complaint: Severe pain right foot for the last few weeks  HPI: Jesus Flowers is a 56 y.o. 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 Came with a chief complaint of severe pain worsens the last week. Patient denies any fever chills nausea vomiting diarrhea.  ED Course:  In the emergency room stable hemodynamically Sodium 120 blood sugar 709 BUN 28 creatinine 1.1 anion gap 11, white count 21,000, lactic acid normal Patient started on Zosyn and vancomycin per emergency room physician MRI right foot suspicion of osteomyelitis  Review of Systems: As per HPI otherwise 10 point review of systems negative.    Past Medical History:  Diagnosis Date   Depression    Diabetes mellitus without complication (HCC)    High cholesterol     Past Surgical History:  Procedure Laterality Date   BACK SURGERY     IRRIGATION AND DEBRIDEMENT FOOT Right 05/06/2021   Procedure: IRRIGATION AND DEBRIDEMENT FOOT;  Surgeon: Edwin CapMcDonald, Adam R, DPM;  Location: [facility] ORS;  Service: Podiatry;  Laterality: Right;   TRANSMETATARSAL AMPUTATION Right 05/09/2021   Procedure: TRANSMETATARSAL AMPUTATION;  Surgeon: Edwin CapMcDonald, Adam R, DPM;  Location: [facility] ORS;  Service: Podiatry;  Laterality: Right;     reports that he has never smoked. He has never used smokeless tobacco. He reports current alcohol use. He reports that he does not use drugs.  No Known Allergies  Family History  Problem Relation Age of Onset   Diabetes Mellitus II Sister      Prior to Admission medications   Medication Sig Start Date End Date Taking? Authorizing Provider  FLUoxetine (PROZAC) 40 MG capsule Take 40 mg by mouth daily as needed. 01/31/21  Yes [provider]  insulin glargine (LANTUS) 100 UNIT/ML injection Inject 0.05 mLs (5 Units total) into the skin daily. Patient taking differently: Inject 6 Units into the skin daily. 05/11/21  Yes Standley BrookingGoodrich, Daniel P, MD  JARDIANCE 25 MG TABS tablet Take 25 mg by mouth daily. 01/31/21  Yes [provider]  lisinopril (ZESTRIL) 10 MG tablet Take 10 mg by mouth daily. 05/15/21  Yes [provider]  lovastatin (MEVACOR) 20 MG tablet Take 20 mg by mouth daily. 06/11/21  Yes [provider]  Multiple Vitamin (MULTIVITAMIN WITH MINERALS) TABS tablet Take 1 tablet by mouth daily. 05/12/21  Yes Standley BrookingGoodrich, Daniel P, MD  sitaGLIPtin-metformin (JANUMET) 50-1000 MG tablet Take 1 tablet by mouth 2 (two) times daily with a meal.   Yes [provider]  doxycycline (VIBRA-TABS) 100 MG tablet Take 1 tablet (100 mg total) by mouth every 12 (twelve) hours. Patient not taking: Reported on 08/12/2021 05/11/21   Standley BrookingGoodrich, Daniel P, MD  oxyCODONE-acetaminophen (PERCOCET/ROXICET) 5-325 MG tablet Take 1-2 tablets by mouth every 6 (six) hours as needed for moderate pain. Patient not taking: Reported on 08/12/2021 05/11/21   Standley BrookingGoodrich, Daniel P, MD    Physical Exam: Vitals:   08/12/21 1328 08/12/21 1712 08/12/21 1827 08/12/21 2000  BP:  (!) 189/85  (!) 144/95  Pulse:  99  88  Resp:  17  16  Temp:  98.9 F (37.2 C)  99.4 F (37.4 C)  TempSrc:  Oral  Oral  SpO2:  98%  97%  Weight:  68.5 kg   Height: 5\' 6"  (1.676 m)       Constitutional: NAD, calm, comfortable Vitals:   08/12/21 1328 08/12/21 1712 08/12/21 1827 08/12/21 2000  BP:  (!) 189/85  (!) 144/95  Pulse:  99  88  Resp:  17  16  Temp:  98.9 F (37.2 C)  99.4 F (37.4 C)  TempSrc:  Oral  Oral  SpO2:  98%  97%  Weight:   68.5 kg   Height: 5\' 6"  (1.676 m)      Eyes: PERRL, lids and conjunctivae normal ENMT: Mucous membranes are moist. Posterior pharynx clear of any exudate or lesions.Normal dentition.  Neck: normal,  supple, no masses, no thyromegaly Respiratory: clear to auscultation bilaterally, no wheezing, no crackles. Normal respiratory effort. No accessory muscle use.  Cardiovascular: Regular rate and rhythm, no murmurs / rubs / gallops. No extremity edema. 2+ pedal pulses. No carotid bruits.  Abdomen: no tenderness, no masses palpated. No hepatosplenomegaly. Bowel sounds positive.  Musculoskeletal: Transmetatarsal amputation Skin: no rashes, lesions, ulcers. No induration Neurologic: CN 2-12 grossly intact. Sensation intact, DTR normal. Strength 5/5 in all 4.  Psychiatric: Normal judgment and insight. Alert and oriented x 3. Normal mood.    Labs on Admission: I have personally reviewed following labs and imaging studies  CBC: Recent Labs  Lab 08/12/21 1338  WBC 21.0*  NEUTROABS 19.3*  HGB 11.9*  HCT 34.9*  MCV 92.1  PLT 0000000   Basic Metabolic Panel: Recent Labs  Lab 08/12/21 1338  NA 120*  K 4.5  CL 86*  CO2 23  GLUCOSE 709*  BUN 28*  CREATININE 1.13  CALCIUM 8.8*   GFR: Estimated Creatinine Clearance: 66.7 mL/min (by C-G formula based on SCr of 1.13 mg/dL). Liver Function Tests: Recent Labs  Lab 08/12/21 1338  AST 28  ALT 22  ALKPHOS 225*  BILITOT 1.0  PROT 8.1  ALBUMIN 3.2*   No results for input(s): LIPASE, AMYLASE in the last 168 hours. No results for input(s): AMMONIA in the last 168 hours. Coagulation Profile: No results for input(s): INR, PROTIME in the last 168 hours. Cardiac Enzymes: No results for input(s): CKTOTAL, CKMB, CKMBINDEX, TROPONINI in the last 168 hours. BNP (last 3 results) No results for input(s): PROBNP in the last 8760 hours. HbA1C: No results for input(s): HGBA1C in the last 72 hours. CBG: Recent Labs  Lab 08/12/21 1832  GLUCAP 265*   Lipid Profile: No results for input(s): CHOL, HDL, LDLCALC, TRIG, CHOLHDL, LDLDIRECT in the last 72 hours. Thyroid Function Tests: No results for input(s): TSH, T4TOTAL, FREET4, T3FREE, THYROIDAB in  the last 72 hours. Anemia Panel: No results for input(s): VITAMINB12, FOLATE, FERRITIN, TIBC, IRON, RETICCTPCT in the last 72 hours. Urine analysis:    Component Value Date/Time   COLORURINE STRAW (A) 05/05/2021 2215   APPEARANCEUR CLEAR (A) 05/05/2021 2215   LABSPEC 1.025 05/05/2021 2215   PHURINE 5.0 05/05/2021 2215   GLUCOSEU >=500 (A) 05/05/2021 2215   HGBUR SMALL (A) 05/05/2021 2215   BILIRUBINUR NEGATIVE 05/05/2021 2215   KETONESUR 5 (A) 05/05/2021 2215   PROTEINUR 100 (A) 05/05/2021 2215   NITRITE NEGATIVE 05/05/2021 2215   LEUKOCYTESUR NEGATIVE 05/05/2021 2215    Radiological Exams on Admission: MR ANKLE RIGHT WO CONTRAST  Result Date: 08/12/2021 CLINICAL DATA:  Diabetic foot pain. History of transmetatarsal amputation of the right foot on 05/09/2021 EXAM: MRI OF THE RIGHT ANKLE WITHOUT CONTRAST TECHNIQUE: Multiplanar, multisequence MR imaging of the ankle was performed. No intravenous  contrast was administered. COMPARISON:  X-ray 08/12/2021 FINDINGS: TENDONS Peroneal: Peroneus longus and brevis tendons are intact and normally positioned. Posteromedial: Tibialis posterior, flexor hallucis longus, and flexor digitorum longus tendons are intact and normally positioned. Mild tenosynovitis of the posteromedial ankle tendons. Anterior: Tibialis anterior, extensor hallucis longus, and extensor digitorum longus tendons are intact and normally positioned. Achilles: Intact. Plantar Fascia: Intact. LIGAMENTS Lateral: Intact tibiofibular ligaments. The anterior and posterior talofibular ligaments are intact. Intact calcaneofibular ligament. Medial: The deltoid and visualized portions of the spring ligament appear intact. CARTILAGE AND BONES Ankle Joint: No significant ankle joint effusion. The talar dome and tibial plafond are intact. Subtalar Joints/Sinus Tarsi: No cartilage defect. Small posterior subtalar joint effusion. Preservation of the anatomic fat within the sinus tarsi. Bones:  Postsurgical changes of transmetatarsal amputation of the first through fifth digits. There is prominent bone marrow edema within the residual second and third metatarsal bases with confluent low T1 signal changes along the resection margins compatible with osteomyelitis. Similar, slightly less pronounced findings are also present at the bases of the fourth and fifth metatarsals. Patchy bone marrow edema within the medial cuneiform without associated T1 marrow signal abnormality. Additional patchy sites of marrow edema within the midfoot and hindfoot, nonspecific. No acute fracture or dislocation. Other: Diffuse edema-like signal throughout the visualized musculature, may reflect myositis or denervation. Extensive soft tissue swelling and edema. No organized fluid collection. IMPRESSION: 1. Postsurgical changes of transmetatarsal amputation of the right foot. Findings of acute osteomyelitis involving the residual bases of the second and third metatarsals. 2. Probable early acute osteomyelitis involving the bases of the fourth and fifth metatarsals. 3. Bone marrow edema within the medial cuneiform may be reactive. 4. Extensive soft tissue swelling and edema. No organized fluid collection. 5. Diffuse edema-like signal throughout the visualized musculature, may reflect myositis or denervation. Electronically Signed   By: Davina Poke D.O.   On: 08/12/2021 20:08   DG Foot Complete Right  Result Date: 08/12/2021 CLINICAL DATA:  Open wound in the skin, pain EXAM: RIGHT FOOT COMPLETE - 3+ VIEW COMPARISON:  05/03/2021 FINDINGS: There is interval transmetatarsal amputation of right foot. No recent fracture or dislocation is seen. There are no focal lytic lesions. Arterial calcifications are seen in the soft tissues. In the lateral view, there is linear area of low attenuation in the subcutaneous plane in the plantar aspect of the foot. Bony spurs seen in the talonavicular joint. Small smooth marginated calcifications  at the attachment of Achilles tendon to the calcaneus may suggest calcific bursitis or tendinosis. IMPRESSION: Interval transmetatarsal amputation of right foot. No recent fracture or dislocation is seen. There is no focal lytic lesion. There is linear area of low attenuation in the soft tissues along the plantar aspect of right foot. Possibility of infectious process in the soft tissues is not excluded. Other findings as described in the body of the report. Electronically Signed   By: Elmer Picker M.D.   On: 08/12/2021 14:30    EKG: Independently reviewed.   Assessment/Plan Principal Problem:   Hyperosmolar non-ketotic state in patient with type 2 diabetes mellitus ([place]) Active Problems:   Osteomyelitis of right foot ([place])   Depression   HTN (hypertension)   Hyperglycemia due to type 2 diabetes mellitus ([place])   Hyponatremia with increased serum osmolality   Osteomyelitis (HCC)   Osteomyelitis right foot Status post transmetatarsal amputation MRI suspicion of osteomyelitis Elevated white count normal lactic acid Started empirically on Zosyn and vancomycin Podiatrist consult  Uncontrolled  diabetes mellitus type 2, hyperosmolality state Sodium 120, blood sugar 700, normal anion gap 10 units of insulin in the emergency room Plan IV fluids, sliding scale, Lantus 6 units at night  Essential hypertension Resume home medication Hydralazine IV as needed for systolic more than [id number]   DVT prophylaxis: Lovenox Code Status: Full code Family Communication: No family in the room, podiatrist in the room Disposition Plan: Home Consults called: Podiatry Admission status: Inpatient   Rendy Lazard G Whitley Strycharz MD [facility]  If 7PM-7AM, please contact night-coverage [email or web address]   08/12/2021, 8:15 PM

## 2021-08-12 NOTE — Consult Note (Signed)
PHARMACY -  BRIEF ANTIBIOTIC NOTE   Pharmacy has received consult(s) for vancomycin from an ED provider.  The patient's profile has been reviewed for ht/wt/allergies/indication/available labs.    One time order(s) placed for: Vancomycin 1g x1  Further antibiotics/pharmacy consults should be ordered by admitting physician if indicated.                       Thank you, Martyn Malay 08/12/2021  4:50 PM

## 2021-08-12 NOTE — Consult Note (Signed)
PODIATRY CONSULTATION  NAME Jesus Flowers MRN [id number] DOB 1965-08-30 DOA 08/12/2021   Reason for consult: OM RT foot Chief Complaint  Patient presents with   Toe Pain    Consulting physician: Marjean Donna MD  History of present illness: 56 y.o. male PmHx uncontrolled DM presenting for worsening infection right foot amputation stump. Patient had transmetatarsal amputation on 05/09/2021 while inpatient. Never had outpatient follow-up because patient had no insurance and concerned for cost. He states that he rested the foot for about 3-4 days after surgery and then went back to work. He did apparently have some [facility]. Patient states that over the past 3-4 days he began having increased pain and tenderness with drainage to the right foot amputation stump. He came to the hospital for further treatment and evaluation.   Past Medical History:  Diagnosis Date   Depression    Diabetes mellitus without complication (HCC)    High cholesterol     CBC Latest Ref Rng & Units 08/12/2021 08/12/2021 05/09/2021  WBC 4.0 - 10.5 K/uL 20.1(H) 21.0(H) 12.2(H)  Hemoglobin 13.0 - 17.0 g/dL 10.6(L) 11.9(L) 10.0(L)  Hematocrit 39.0 - 52.0 % 30.0(L) 34.9(L) 29.2(L)  Platelets 150 - 400 K/uL 366 392 379    BMP Latest Ref Rng & Units 08/12/2021 08/12/2021 05/08/2021  Glucose 70 - 99 mg/dL - 709(HH) 111(H)  BUN 6 - 20 mg/dL - 28(H) 16  Creatinine 0.61 - 1.24 mg/dL 1.18 1.13 0.94  Sodium 135 - 145 mmol/L - 120(L) 134(L)  Potassium 3.5 - 5.1 mmol/L - 4.5 4.1  Chloride 98 - 111 mmol/L - 86(L) 103  CO2 22 - 32 mmol/L - 23 21(L)  Calcium 8.9 - 10.3 mg/dL - 8.8(L) 8.6(L)         Physical Exam: General: The patient is alert and oriented x3 in no acute distress.   Dermatology: Large open granular and fibrotic wound to the amputation stump of the right foot. No strong malodor. Minimal drainage.   Vascular: Erythema and edema noted to the foot up to the level of the ankle.  Pulses palpable. Skin is warm to touch.   Neurological: Light touch and protective threshold diminished.  Musculoskeletal Exam: S/p right transmetatarsal amputation 05/09/2021.  XR foot RT today 08/12/2021: IMPRESSION: Interval transmetatarsal amputation of right foot. No recent fracture or dislocation is seen. There is no focal lytic lesion.   There is linear area of low attenuation in the soft tissues along the plantar aspect of right foot. Possibility of infectious process in the soft tissues is not excluded.   Other findings as described in the body of the report.  MR ANKLE RIGHT WO CONTRAST today 08/12/2021: IMPRESSION: 1. Postsurgical changes of transmetatarsal amputation of the right foot. Findings of acute osteomyelitis involving the residual bases of the second and third metatarsals. 2. Probable early acute osteomyelitis involving the bases of the fourth and fifth metatarsals. 3. Bone marrow edema within the medial cuneiform may be reactive. 4. Extensive soft tissue swelling and edema. No organized fluid collection. 5. Diffuse edema-like signal throughout the visualized musculature, may reflect myositis or denervation.  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 MRI and XR reviewed.  - patient will require revisional amputation of the foot for limb salvage likely at the level of the TarsoMetatarsal Joint. This was explained to the patient and he is amenable to this.  - Preoperative orders placed.  - Surgery tentatively planned for  tomorrow, 08/13/2021, after 5PM. NPO after full breakfast.  - Continue IV abx as per admitting physician - Podiatry will follow   Thank you for the consult.     Edrick Kins, DPM [facility]  Dr. Edrick Kins, DPM    [address], [place] [address]                Office [phone]  Fax [phone]

## 2021-08-12 NOTE — ED Notes (Signed)
Patient in MRI at this time. 

## 2021-08-12 NOTE — Consult Note (Signed)
CODE SEPSIS - PHARMACY COMMUNICATION  **Broad Spectrum Antibiotics should be administered within 1 hour of Sepsis diagnosis**  Time Code Sepsis Called/Page Received: 1624  Antibiotics Ordered: 1630  Time of 1st antibiotic administration: 1700  Additional action taken by pharmacy: Messaged RN to confirm pt's dosing wt and to ensure med's stocked in pyxis to help triage zosyn 1st dose.  If necessary, Name of Provider/Nurse Contacted: Amparo Bristol RN  Martyn Malay ,PharmD Clinical Pharmacist  08/12/2021  4:40 PM

## 2021-08-12 NOTE — Consult Note (Signed)
Pharmacy Antibiotic Note  Jesus Flowers is a 56 y.o. male admitted on 08/12/2021 with sepsis.  Pharmacy has been consulted for Vancomycin dosing.  Plan: Vancomycin 1500 mg IV Q 24 hrs.  Goal AUC 400-550. Expected AUC: 509 SCr used: 1.13   Height: 5\' 6"  (167.6 cm) Weight: 68.5 kg (151 lb 0.2 oz) IBW/kg (Calculated) : 63.8  Temp (24hrs), Avg:98.9 F (37.2 C), Min:98.5 F (36.9 C), Max:99.4 F (37.4 C)  Recent Labs  Lab 08/12/21 1338 08/12/21 2012  WBC 21.0* 20.1*  CREATININE 1.13  --   LATICACIDVEN 1.8  --     Estimated Creatinine Clearance: 66.7 mL/min (by C-G formula based on SCr of 1.13 mg/dL).    No Known Allergies  Antimicrobials this admission: 1/15 Zosyn >>  1/15 Vancomycin >>   Dose adjustments this admission: none  Microbiology results: 1/15 BCx: pending   Thank you for allowing pharmacy to be a part of this patients care.  Daivon Rayos Rodriguez-Guzman PharmD, BCPS 08/12/2021 9:11 PM

## 2021-08-12 NOTE — ED Provider Notes (Signed)
[facility] Provider Note    Event Date/Time   First MD Initiated Contact with Patient 08/12/21 1551     (approximate)   History   Toe Pain   HPI  Jesus Flowers is a 56 y.o. male with diabetes who comes in with right foot pain x2 months.  Patient reports pain is sharp shooting.  Patient has had all of his toes on his affected foot amputated 2 to 3 months ago.  Patient reports draining within the last couple days has had a lot more drinking.  He also reports that he still causing her pain.  He reports that he comes in today due to worsening pain and drainage from the foot.  He denies any fevers, chest pain, shortness of breath, abdominal pain or other symptoms.  On review of records patient had admission from 10/6 10/14 where he was admitted for right great toe infection and is status post amputation with podiatry given it was gangrene.   Family at bedside and they declined interpreter.   Physical Exam   Triage Vital Signs: ED Triage Vitals  Enc Vitals Group     BP 08/12/21 1325 (!) 203/103     Pulse Rate 08/12/21 1325 (!) 110     Resp 08/12/21 1325 18     Temp 08/12/21 1325 98.5 F (36.9 C)     Temp Source 08/12/21 1325 Oral     SpO2 08/12/21 1325 97 %     Weight --      Height 08/12/21 1328 5\' 6"  (1.676 m)     Head Circumference --      Peak Flow --      Pain Score 08/12/21 1328 10     Pain Loc --      Pain Edu? --      Excl. in [place]?      Most recent vital signs: Vitals:   08/12/21 1325  BP: (!) 203/103  Pulse: (!) 110  Resp: 18  Temp: 98.5 F (36.9 C)  SpO2: 97%     General: Awake, no distress.  Well-appearing CV:  Good peripheral perfusion.  Tachycardic Resp:  Normal effort.  Abd:  No distention.  Other:  Right foot with partial amputation with granulation tissue noted with some black tissue noted noted around the outside with purulent drainage coming out of the granulation tissue.  Good distal pulses, warm and well-perfused.   See picture in media tab   ED Results / Procedures / Treatments   Labs (all labs ordered are listed, but only abnormal results are displayed) Labs Reviewed  CBC WITH DIFFERENTIAL/PLATELET - Abnormal; Notable for the following components:      Result Value   WBC 21.0 (*)    RBC 3.79 (*)    Hemoglobin 11.9 (*)    HCT 34.9 (*)    Neutro Abs 19.3 (*)    Lymphs Abs 0.6 (*)    Abs Immature Granulocytes 0.12 (*)    All other components within normal limits  COMPREHENSIVE METABOLIC PANEL - Abnormal; Notable for the following components:   Sodium 120 (*)    Chloride 86 (*)    Glucose, Bld 709 (*)    BUN 28 (*)    Calcium 8.8 (*)    Albumin 3.2 (*)    Alkaline Phosphatase 225 (*)    All other components within normal limits  LACTIC ACID, PLASMA  LACTIC ACID, PLASMA    RADIOLOGY I have reviewed the xray personally and agree  with radiology read no fracture, transmetatarsal amputation   PROCEDURES:  Critical Care performed: Yes, see critical care procedure note(s)  .Critical Care Performed by: Vanessa [place], MD Authorized by: Vanessa [place], MD   Critical care provider statement:    Critical care time (minutes):  30   Critical care was necessary to treat or prevent imminent or life-threatening deterioration of the following conditions:  Sepsis   Critical care was time spent personally by me on the following activities:  Development of treatment plan with patient or surrogate, discussions with consultants, evaluation of patient's response to treatment, examination of patient, ordering and review of laboratory studies, ordering and review of radiographic studies, ordering and performing treatments and interventions, pulse oximetry, re-evaluation of patient's condition and review of old charts   MEDICATIONS ORDERED IN ED: Medications  oxyCODONE-acetaminophen (PERCOCET/ROXICET) 5-325 MG per tablet 1 tablet (1 tablet Oral Given 08/12/21 1340)  vancomycin (VANCOCIN) IVPB 1000 mg/200 mL  premix (has no administration in time range)  sodium chloride 0.9 % bolus 1,000 mL (1,000 mLs Intravenous New Bag/Given 08/12/21 1706)  insulin aspart (novoLOG) injection 10 Units (10 Units Intravenous Given 08/12/21 1700)  piperacillin-tazobactam (ZOSYN) IVPB 3.375 g (3.375 g Intravenous New Bag/Given 08/12/21 1700)     IMPRESSION / MDM / [place] / ED COURSE  I reviewed the triage vital signs and the nursing notes.  Patient with known diabetes who comes in with concern for foot infection after prior amputation.  Differential diagnosis includes, but is not limited to, abscess, cellulitis, osteomyelitis, Electra abnormalities, hypoglycemia, DKA.  Unlikely acute arterial occlusion  Patient's sugar significantly elevated but no evidence of DKA.  Given 10 of IV insulin and a liter of fluids to help with that.  Patient appears hyponatremic but his corrected sodium is 130-135.  Patient's white count came back elevated heart rate was elevated patient did sepsis criteria.  Sepsis alert called.  Patient started on broad-spectrum antibiotic to cover for possible osteomyelitis with vancomycin and Zosyn.  Patient given a dose of oxycodone to help with pain.  I discussed the case with Dr. Amalia Hailey from podiatry who recommends MRI.  Will admit to the hospital team.   FINAL CLINICAL IMPRESSION(S) / ED DIAGNOSES   Final diagnoses:  Sepsis, due to unspecified organism, unspecified whether acute organ dysfunction present ([place])  Foot infection  Hyperglycemia     Rx / DC Orders   ED Discharge Orders     None        Note:  This document was prepared using Dragon voice recognition software and may include unintentional dictation errors.   Vanessa , MD 08/12/21 [phone]

## 2021-08-12 NOTE — Sepsis Progress Note (Signed)
[facility] is following this code sepsis ?

## 2021-08-13 ENCOUNTER — Encounter: Payer: Self-pay | Admitting: Emergency Medicine

## 2021-08-13 LAB — HEMOGLOBIN A1C
Hgb A1c MFr Bld: 11.6 % — ABNORMAL HIGH (ref 4.8–5.6)
Mean Plasma Glucose: 286.22 mg/dL

## 2021-08-13 LAB — BASIC METABOLIC PANEL
Anion gap: 9 (ref 5–15)
BUN: 30 mg/dL — ABNORMAL HIGH (ref 6–20)
CO2: 25 mmol/L (ref 22–32)
Calcium: 8.7 mg/dL — ABNORMAL LOW (ref 8.9–10.3)
Chloride: 97 mmol/L — ABNORMAL LOW (ref 98–111)
Creatinine, Ser: 1.1 mg/dL (ref 0.61–1.24)
GFR, Estimated: >60 mL/min (ref >60)
Glucose, Bld: 144 mg/dL — ABNORMAL HIGH (ref 70–99)
Potassium: 4 mmol/L (ref 3.5–5.1)
Sodium: 131 mmol/L — ABNORMAL LOW (ref 135–145)

## 2021-08-13 LAB — SURGICAL PCR SCREEN
MRSA, PCR: NEGATIVE
Staphylococcus aureus: POSITIVE — AB

## 2021-08-13 LAB — CBC
HCT: 29.7 % — ABNORMAL LOW (ref 39.0–52.0)
Hemoglobin: 10.5 g/dL — ABNORMAL LOW (ref 13.0–17.0)
MCH: 31.7 pg (ref 26.0–34.0)
MCHC: 35.4 g/dL (ref 30.0–36.0)
MCV: 89.7 fL (ref 80.0–100.0)
Platelets: 370 10*3/uL (ref 150–400)
RBC: 3.31 MIL/uL — ABNORMAL LOW (ref 4.22–5.81)
RDW: 12.1 % (ref 11.5–15.5)
WBC: 16.7 10*3/uL — ABNORMAL HIGH (ref 4.0–10.5)
nRBC: 0 % (ref 0.0–0.2)

## 2021-08-13 LAB — GLUCOSE, CAPILLARY
Glucose-Capillary: 225 mg/dL — ABNORMAL HIGH (ref 70–99)
Glucose-Capillary: 351 mg/dL — ABNORMAL HIGH (ref 70–99)
Glucose-Capillary: 151 mg/dL — ABNORMAL HIGH (ref 70–99)
Glucose-Capillary: 128 mg/dL — ABNORMAL HIGH (ref 70–99)
Glucose-Capillary: 288 mg/dL — ABNORMAL HIGH (ref 70–99)

## 2021-08-13 LAB — RESP PANEL BY RT-PCR (FLU A&B, COVID) ARPGX2
Influenza A by PCR: NEGATIVE
Influenza B by PCR: NEGATIVE
SARS Coronavirus 2 by RT PCR: NEGATIVE

## 2021-08-13 MED ORDER — ONDANSETRON HCL 4 MG/2ML IJ SOLN
INTRAMUSCULAR | Status: AC
  Filled 2021-08-13: qty 2

## 2021-08-13 MED ORDER — MIDAZOLAM HCL 2 MG/2ML IJ SOLN
INTRAMUSCULAR | Status: AC
  Filled 2021-08-13: qty 2

## 2021-08-13 MED ORDER — MUPIROCIN 2 % EX OINT
1.0000 "application " | TOPICAL_OINTMENT | Freq: Two times a day (BID) | CUTANEOUS | Status: DC
  Administered 2021-08-13 – 2021-08-16 (×5): 1 via NASAL
  Filled 2021-08-13: qty 22

## 2021-08-13 MED ORDER — OXYCODONE HCL 5 MG PO TABS
5.0000 mg | ORAL_TABLET | Freq: Once | ORAL | Status: AC | PRN
  Administered 2021-08-13: 5 mg via ORAL
  Filled 2021-08-13: qty 1

## 2021-08-13 MED ORDER — FENTANYL CITRATE (PF) 100 MCG/2ML IJ SOLN: 25.0000 ug | INTRAMUSCULAR | Status: DC | PRN

## 2021-08-13 MED ORDER — FENTANYL CITRATE (PF) 100 MCG/2ML IJ SOLN
INTRAMUSCULAR | Status: AC
  Filled 2021-08-13: qty 2

## 2021-08-13 MED ORDER — OXYCODONE HCL 5 MG/5ML PO SOLN: 5.0000 mg | Freq: Once | ORAL | Status: AC | PRN

## 2021-08-13 MED ORDER — CHLORHEXIDINE GLUCONATE CLOTH 2 % EX PADS
6.0000 | MEDICATED_PAD | Freq: Every day | CUTANEOUS | Status: DC
  Administered 2021-08-14 – 2021-08-17 (×4): 6 via TOPICAL

## 2021-08-13 MED ORDER — SODIUM CHLORIDE 0.9 % IV SOLN
250.0000 mL | INTRAVENOUS | Status: DC | PRN
  Administered 2021-08-13: 250 mL via INTRAVENOUS

## 2021-08-13 MED ORDER — LIDOCAINE HCL (PF) 2 % IJ SOLN
INTRAMUSCULAR | Status: AC
  Filled 2021-08-13: qty 5

## 2021-08-13 MED ORDER — PROPOFOL 10 MG/ML IV BOLUS
INTRAVENOUS | Status: AC
  Filled 2021-08-13: qty 20

## 2021-08-13 NOTE — Anesthesia Preprocedure Evaluation (Addendum)
Anesthesia Evaluation  Patient identified by MRN, date of birth, ID band Patient awake    Reviewed: Allergy & Precautions, H&P , NPO status , Patient's Chart, lab work & pertinent test results  History of Anesthesia Complications Negative for: history of anesthetic complications  Airway Mallampati: III   Neck ROM: full    Dental  (+) Poor Dentition, Chipped   Pulmonary neg pulmonary ROS,    Pulmonary exam normal breath sounds clear to auscultation       Cardiovascular Exercise Tolerance: Good hypertension, Normal cardiovascular exam Rhythm:Regular Rate:Normal     Neuro/Psych PSYCHIATRIC DISORDERS Depression  Neuromuscular disease (diabetic neuropathy)    GI/Hepatic negative GI ROS, Neg liver ROS,   Endo/Other  diabetes, Poorly Controlled, Type 2, Insulin Dependent  Renal/GU Renal disease     Musculoskeletal   Abdominal   Peds  Hematology negative hematology ROS (+)   Anesthesia Other Findings   Reproductive/Obstetrics                            Anesthesia Physical  Anesthesia Plan  ASA: 3  Anesthesia Plan: General   Post-op Pain Management:    Induction: Intravenous  PONV Risk Score and Plan: 3 and Ondansetron and Treatment may vary due to age or medical condition  Airway Management Planned: LMA  Additional Equipment:   Intra-op Plan:   Post-operative Plan: Extubation in OR  Informed Consent: I have reviewed the patients History and Physical, chart, labs and discussed the procedure including the risks, benefits and alternatives for the proposed anesthesia with the patient or authorized representative who has indicated his/her understanding and acceptance.     Dental advisory given  Plan Discussed with: Anesthesiologist and CRNA  Anesthesia Plan Comments: (Patient consented for risks of anesthesia including but not limited to:  - adverse reactions to medications -  damage to eyes, teeth, lips or other oral mucosa - nerve damage due to positioning  - sore throat or hoarseness - damage to heart, brain, nerves, lungs, other parts of body or loss of life  Informed patient about role of CRNA in peri- and intra-operative care.  Patient voiced understanding.)       Anesthesia Quick Evaluation

## 2021-08-13 NOTE — Progress Notes (Signed)
Inpatient Diabetes Program Recommendations  AACE/ADA: New Consensus Statement on Inpatient Glycemic Control (2015)  Target Ranges:  Prepandial:   less than 140 mg/dL      Peak postprandial:   less than 180 mg/dL (1-2 hours)      Critically ill patients:  140 - 180 mg/dL    Latest Reference Range & Units 08/12/21 13:38  Glucose 70 - 99 mg/dL 709 (HH)    Latest Reference Range & Units 08/12/21 18:32 08/12/21 21:09 08/13/21 07:59  Glucose-Capillary 70 - 99 mg/dL  10 units Novolog at 1700 265 (H)  8 units Novolog  177 (H)    6 units Semglee  225 (H)    Latest Reference Range & Units 05/03/21 14:52  Hemoglobin A1C 4.8 - 5.6 % 12.3 (H)  (306 mg/dl)    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  Offered Spanish interpretive services, however, pt declined and stated he felt comfortable with conversation in [place].  Pt verifies he is taking Jardiance 25 mg daily + Lantus 6 units QPM.  Is NOT taking the Janumet.  Last visit with his PCP at [facility] center was late Nov or early Dec per pt report.  No changes made to pt's DM meds at that visit, however, pt did report that the MD at the PCP office discussed with him making changes to his DM meds in the future--possibly stopping the Lantus and Jardiance and starting Trulicity once weekly.  Discussed with pt what Trulicity is and how it works.  Pt only checking CBGs Q3-4 days at home--Has CBG meter and supplies.   Admits to taking his Jardiance and Lantus on a regular basis.  We reviewed his A1c of 12.3% back from Oct and discussed with pt that we have drawn a current A1c levels and are waiting for the results.  Discussed with pt goal A1c levels, goal CBG levels at home (especially in the context of preventing further infections and helping current foot wounds to heal).  Encouraged pt to check his CBGs at least daily at home (if not more often).  Pt states he has another appt with PCP on Feb 9th.  Appreciative of visit and did not have any further questions for me.  Scheduled for surgery today.    --Will follow patient during hospitalization--  Wyn Quaker RN, MSN, CDE Diabetes Coordinator Inpatient Glycemic Control Team Team Pager: [phone] (8a-5p)

## 2021-08-13 NOTE — Interval H&P Note (Signed)
History and Physical Interval Note:  08/13/2021 4:49 PM  Jesus Flowers  has presented today for surgery, with the diagnosis of Osteomyelitis.  The various methods of treatment have been discussed with the patient and family. After consideration of risks, benefits and other options for treatment, the patient has consented to  Procedure(s): AMPUTATION FOOT (Right) INCISION AND DRAINAGE (Right) as a surgical intervention.  The patient's history has been reviewed, patient examined, no change in status, stable for surgery.  I have reviewed the patient's chart and labs.  Questions were answered to the patient's satisfaction.     Louann Sjogren

## 2021-08-13 NOTE — Progress Notes (Signed)
Patient's procedure rescheduled until 08/14/2021 due to eating at 10:45.

## 2021-08-13 NOTE — Plan of Care (Signed)
Patient unfortunately ate a late breakfast. Will postpone surgery to tomorrow at noon with Dr. Logan Bores. Patient will be NPO after midnight.

## 2021-08-13 NOTE — Progress Notes (Signed)
[facility] [place] NAME: Stanely Chapel    MR#:  [id number]  DATE OF BIRTH:  [birth year]  SUBJECTIVE:  Came in with worsening right foot and uncontrolled diabetes. He could not have outpatient follow-up due to lack of insurance. Continue to work 3 to 4 days after his previous surgery in October 2022. Started having increasing pain and right lower extremity came to the hospital for further evaluation management  REVIEW OF SYSTEMS:   Review of Systems  Constitutional:  Negative for chills, fever and weight loss.  HENT:  Negative for ear discharge, ear pain and nosebleeds.   Eyes:  Negative for blurred vision, pain and discharge.  Respiratory:  Negative for sputum production, shortness of breath, wheezing and stridor.   Cardiovascular:  Negative for chest pain, palpitations, orthopnea and PND.  Gastrointestinal:  Negative for abdominal pain, diarrhea, nausea and vomiting.  Genitourinary:  Negative for frequency and urgency.  Musculoskeletal:  Positive for joint pain. Negative for back pain.  Neurological:  Positive for weakness. Negative for sensory change, speech change and focal weakness.  Psychiatric/Behavioral:  Negative for depression and hallucinations. The patient is not nervous/anxious.   Tolerating Diet: Tolerating PT:   DRUG ALLERGIES:  No Known Allergies  VITALS:  Blood pressure 121/64, pulse 87, temperature 98.3 F (36.8 C), resp. rate 17, height 5\' 6"  (1.676 m), weight 68.5 kg, SpO2 96 %.  PHYSICAL EXAMINATION:   Physical Exam  GENERAL:  56 y.o.-year-old patient lying in the bed with no acute distress.  HEENT: Head atraumatic, normocephalic. Oropharynx and nasopharynx clear.  LUNGS: Normal breath sounds bilaterally, no wheezing, rales, rhonchi.  CARDIOVASCULAR: S1, S2 normal. No murmurs, rubs, or gallops.  ABDOMEN: Soft, nontender, nondistended. Bowel sounds present.  EXTREMITIES:  On 08/12/2021      NEUROLOGIC:  nonfocal PSYCHIATRIC:  patient is alert and awake SKIN: as above  LABORATORY PANEL:  CBC Recent Labs  Lab 08/13/21 0438  WBC 16.7*  HGB 10.5*  HCT 29.7*  PLT 370    Chemistries  Recent Labs  Lab 08/12/21 1338 08/12/21 2012 08/13/21 0438  NA 120*   < > 131*  K 4.5   < > 4.0  CL 86*   < > 97*  CO2 23   < > 25  GLUCOSE 709*   < > 144*  BUN 28*   < > 30*  CREATININE 1.13   < > 1.10  CALCIUM 8.8*   < > 8.7*  AST 28  --   --   ALT 22  --   --   ALKPHOS 225*  --   --   BILITOT 1.0  --   --    < > = values in this interval not displayed.   Cardiac Enzymes No results for input(s): TROPONINI in the last 168 hours. RADIOLOGY:  MR ANKLE RIGHT WO CONTRAST  Result Date: 08/12/2021 CLINICAL DATA:  Diabetic foot pain. History of transmetatarsal amputation of the right foot on 05/09/2021 EXAM: MRI OF THE RIGHT ANKLE WITHOUT CONTRAST TECHNIQUE: Multiplanar, multisequence MR imaging of the ankle was performed. No intravenous contrast was administered. COMPARISON:  X-ray 08/12/2021 FINDINGS: TENDONS Peroneal: Peroneus longus and brevis tendons are intact and normally positioned. Posteromedial: Tibialis posterior, flexor hallucis longus, and flexor digitorum longus tendons are intact and normally positioned. Mild tenosynovitis of the posteromedial ankle tendons. Anterior: Tibialis anterior, extensor hallucis longus, and extensor digitorum longus tendons are intact and normally positioned. Achilles: Intact. Plantar Fascia: Intact.  LIGAMENTS Lateral: Intact tibiofibular ligaments. The anterior and posterior talofibular ligaments are intact. Intact calcaneofibular ligament. Medial: The deltoid and visualized portions of the spring ligament appear intact. CARTILAGE AND BONES Ankle Joint: No significant ankle joint effusion. The talar dome and tibial plafond are intact. Subtalar Joints/Sinus Tarsi: No cartilage defect. Small posterior subtalar joint effusion. Preservation of the anatomic fat within the  sinus tarsi. Bones: Postsurgical changes of transmetatarsal amputation of the first through fifth digits. There is prominent bone marrow edema within the residual second and third metatarsal bases with confluent low T1 signal changes along the resection margins compatible with osteomyelitis. Similar, slightly less pronounced findings are also present at the bases of the fourth and fifth metatarsals. Patchy bone marrow edema within the medial cuneiform without associated T1 marrow signal abnormality. Additional patchy sites of marrow edema within the midfoot and hindfoot, nonspecific. No acute fracture or dislocation. Other: Diffuse edema-like signal throughout the visualized musculature, may reflect myositis or denervation. Extensive soft tissue swelling and edema. No organized fluid collection. IMPRESSION: 1. Postsurgical changes of transmetatarsal amputation of the right foot. Findings of acute osteomyelitis involving the residual bases of the second and third metatarsals. 2. Probable early acute osteomyelitis involving the bases of the fourth and fifth metatarsals. 3. Bone marrow edema within the medial cuneiform may be reactive. 4. Extensive soft tissue swelling and edema. No organized fluid collection. 5. Diffuse edema-like signal throughout the visualized musculature, may reflect myositis or denervation. Electronically Signed   By: Duanne Guess D.O.   On: 08/12/2021 20:08   DG Foot Complete Right  Result Date: 08/12/2021 CLINICAL DATA:  Open wound in the skin, pain EXAM: RIGHT FOOT COMPLETE - 3+ VIEW COMPARISON:  05/03/2021 FINDINGS: There is interval transmetatarsal amputation of right foot. No recent fracture or dislocation is seen. There are no focal lytic lesions. Arterial calcifications are seen in the soft tissues. In the lateral view, there is linear area of low attenuation in the subcutaneous plane in the plantar aspect of the foot. Bony spurs seen in the talonavicular joint. Small smooth  marginated calcifications at the attachment of Achilles tendon to the calcaneus may suggest calcific bursitis or tendinosis. IMPRESSION: Interval transmetatarsal amputation of right foot. No recent fracture or dislocation is seen. There is no focal lytic lesion. There is linear area of low attenuation in the soft tissues along the plantar aspect of right foot. Possibility of infectious process in the soft tissues is not excluded. Other findings as described in the body of the report. Electronically Signed   By: Ernie Avena M.D.   On: 08/12/2021 14:30   ASSESSMENT AND PLAN:  RODDY LAIDIG is a 56 y.o. male with medical history significant of hypertension, hyperlipidemia, diabetes mellitus, depression, who presents with right foot wound infection.  MRI Right foot  . Postsurgical changes of transmetatarsal amputation of the right foot. Findings of acute osteomyelitis involving the residual bases of the second and third metatarsals. 2. Probable early acute osteomyelitis involving the bases of the fourth and fifth metatarsals. 3. Bone marrow edema within the medial cuneiform may be reactive. 4. Extensive soft tissue swelling and edema. No organized fluid collection. 5. Diffuse edema-like signal throughout the visualized musculature, may reflect myositis or denervation.  Osteomyelitis right foot s/p transmetatarsal amputation leukocytosis -- continue IV Zosyn and vancomycin -- podiatry consultation with Dr. Logan Bores appreciated. Patient is scheduled for Tarsometatarsal amputation --blood culture negative  uncontrolled type II diabetes with severe hyperglycemia and diabetic neuropathy -- A1c 12.3 --  continue long and short acting insulin.  Hypertension -- continue lisinopril  hyperlipidemia on pravastatin  Anxiety/depression -- continue fluoxetine  Procedures: Family communication : none Consults : podiatry CODE STATUS: full DVT Prophylaxis : Lovenox Level of care:  Med-Surg Status is: Inpatient  Remains inpatient appropriate because: right foot osteomyelitis/infection pending surgery        TOTAL TIME TAKING CARE OF THIS PATIENT: 30 minutes.  >50% time spent on counselling and coordination of care  Note: This dictation was prepared with Dragon dictation along with smaller phrase technology. Any transcriptional errors that result from this process are unintentional.  Fritzi Mandes M.D    [facility]   CC: Primary care physician; Center, [place] Patient ID: CODYE NEBERGALL, male   DOB: 01/29/1966, 56 y.o.   MRN: [id number]

## 2021-08-13 NOTE — Plan of Care (Signed)

## 2021-08-13 NOTE — Progress Notes (Signed)
°   08/13/21 1300  Clinical Encounter Type  Visited With Patient  Visit Type Initial;Social support   Chaplain found the patient in good spirits and provided compassionate presence and light hearted conversation.

## 2021-08-14 ENCOUNTER — Inpatient Hospital Stay: Payer: Self-pay | Admitting: Anesthesiology

## 2021-08-14 ENCOUNTER — Encounter
Admission: EM | Disposition: A | Discharge status: Home Health | Payer: Self-pay | Source: Home / Self Care | Attending: Hospitalist

## 2021-08-14 ENCOUNTER — Inpatient Hospital Stay: Payer: Self-pay

## 2021-08-14 HISTORY — PX: AMPUTATION: SHX166

## 2021-08-14 HISTORY — PX: INCISION AND DRAINAGE: SHX5863

## 2021-08-14 LAB — GLUCOSE, CAPILLARY
Glucose-Capillary: 299 mg/dL — ABNORMAL HIGH (ref 70–99)
Glucose-Capillary: 196 mg/dL — ABNORMAL HIGH (ref 70–99)
Glucose-Capillary: 173 mg/dL — ABNORMAL HIGH (ref 70–99)
Glucose-Capillary: 327 mg/dL — ABNORMAL HIGH (ref 70–99)
Glucose-Capillary: 426 mg/dL — ABNORMAL HIGH (ref 70–99)
Glucose-Capillary: 456 mg/dL — ABNORMAL HIGH (ref 70–99)

## 2021-08-14 LAB — CREATININE, SERUM
Creatinine, Ser: 1.28 mg/dL — ABNORMAL HIGH (ref 0.61–1.24)
GFR, Estimated: >60 mL/min

## 2021-08-14 IMAGING — DX DG FOOT COMPLETE 3+V*R*
3 series · 3 of 3 positions shown · non-contrast
Comparison: [DATE]

CLINICAL DATA: Postop amputation.

EXAM:
RIGHT FOOT COMPLETE - 3+ VIEW

[foot ap]
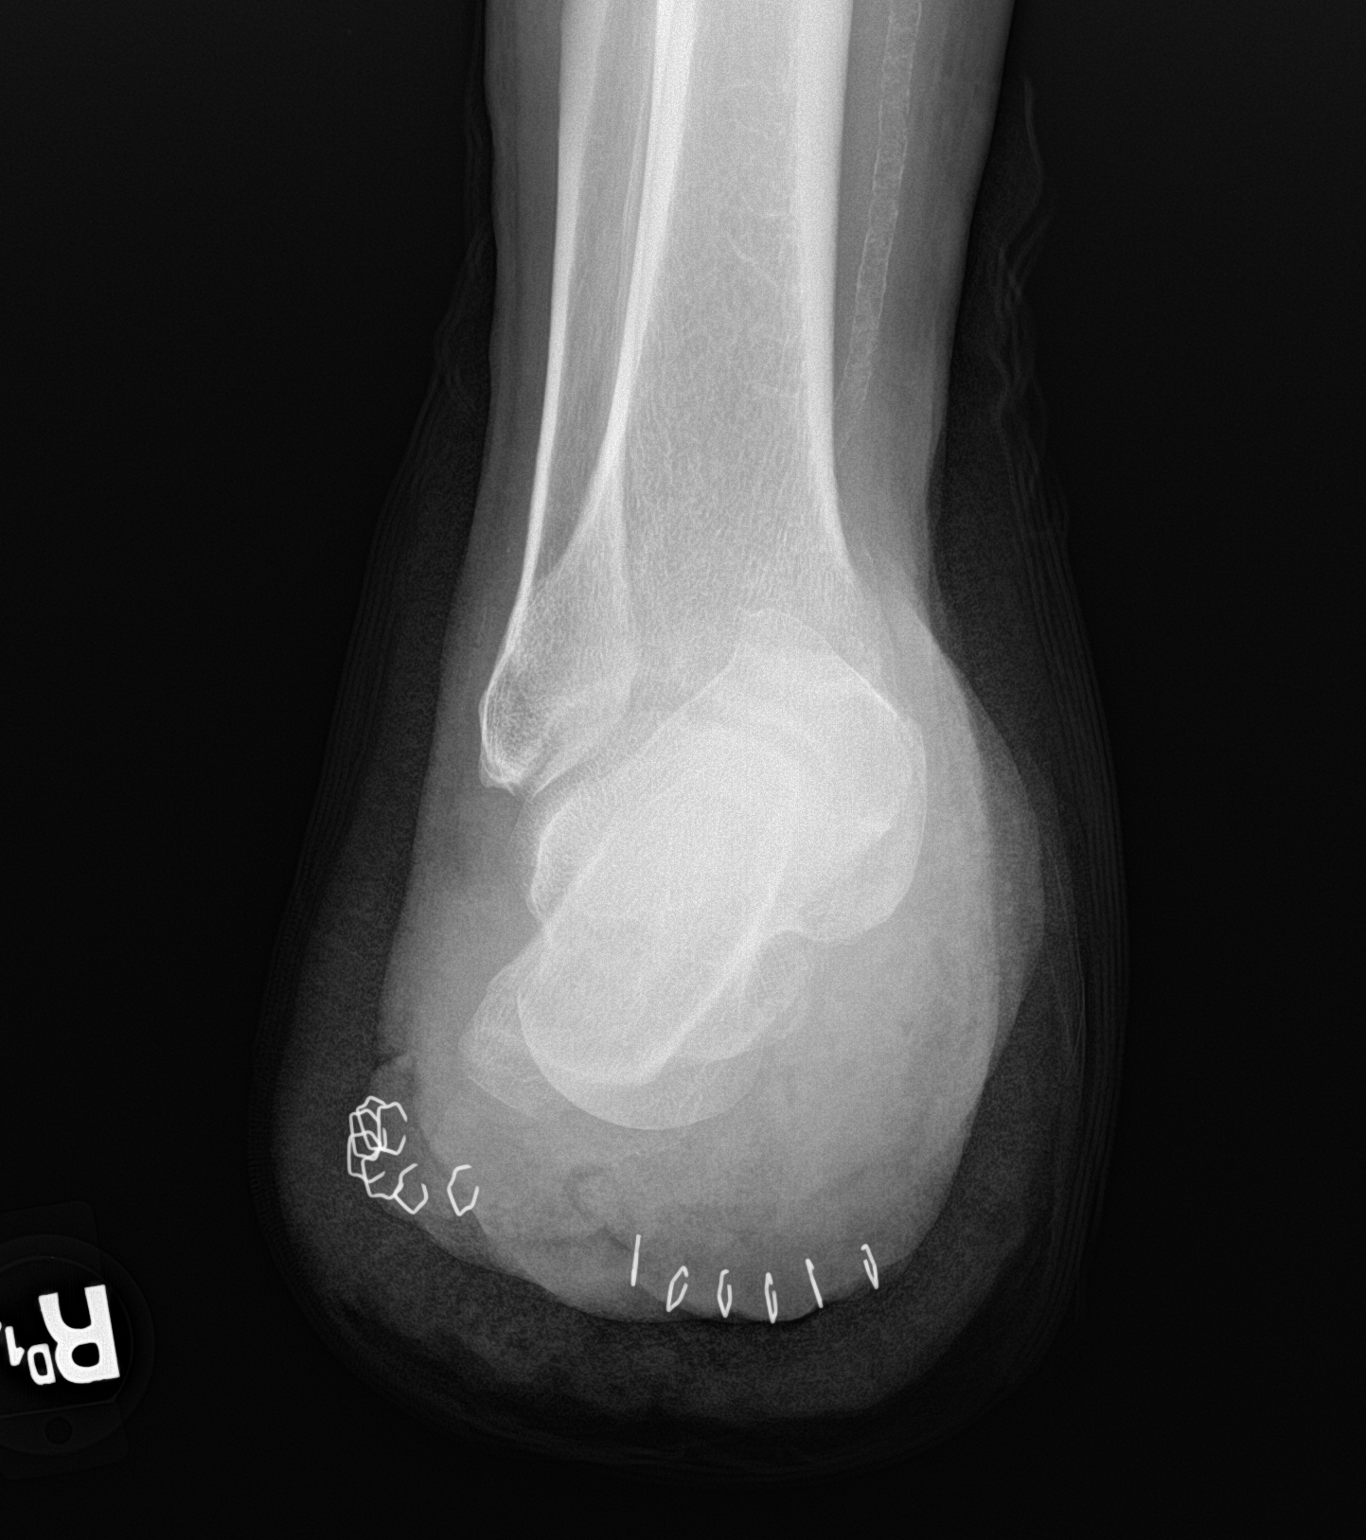

[foot obl]
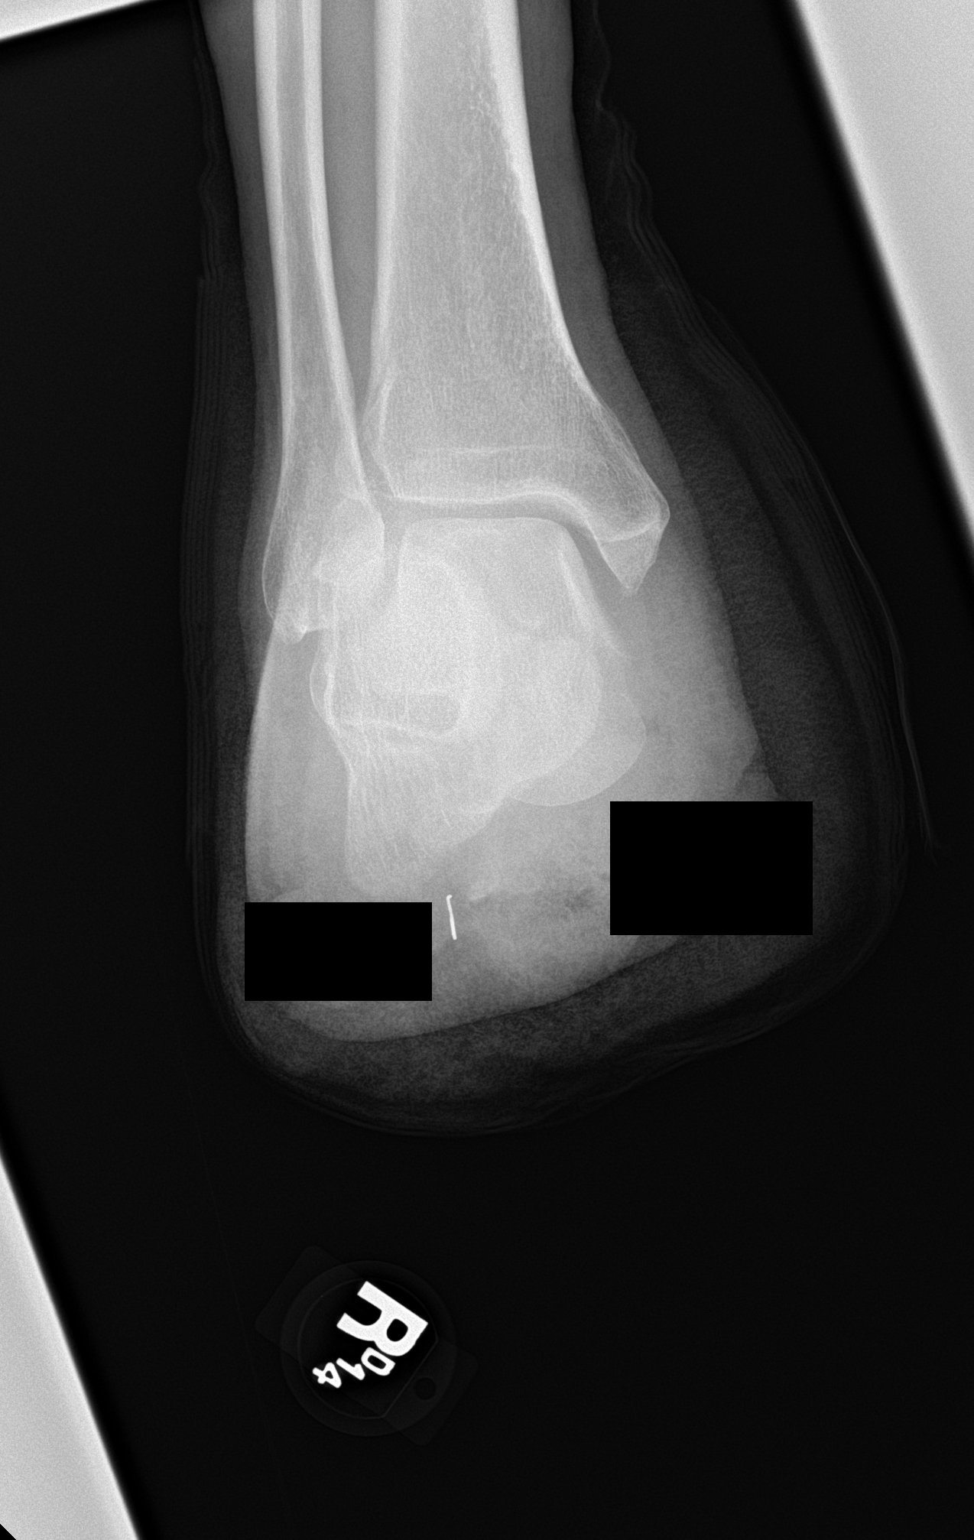

[foot lat]
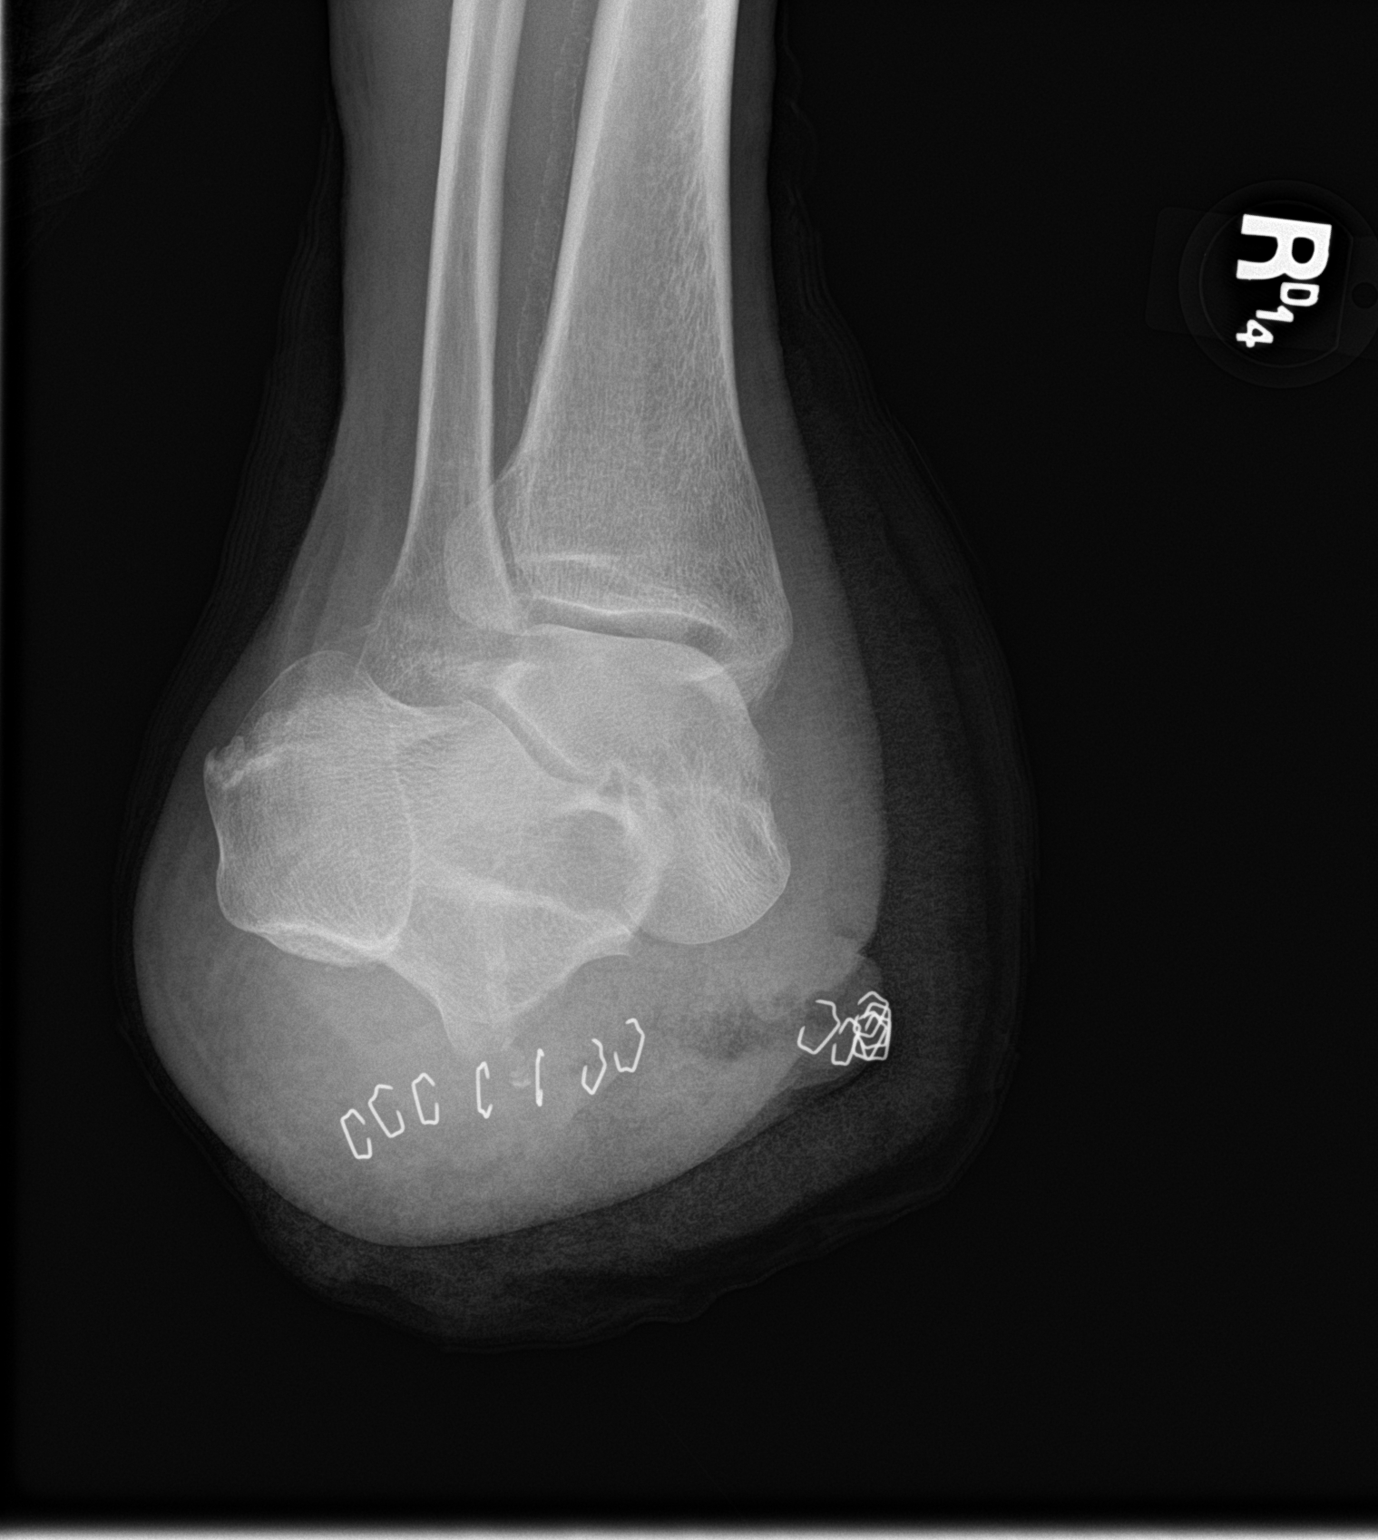

[3 of 3 positions shown; findings below may reference images not displayed]

FINDINGS: Interval amputation of the RIGHT foot, sparing the calcaneus and
talus. Soft tissue edema and irregularity, with sutures noted.
IMPRESSION: Postoperative appearance of the RIGHT foot following amputation.

## 2021-08-14 SURGERY — AMPUTATION, FOOT, PARTIAL
Anesthesia: General | Site: Foot | Laterality: Right

## 2021-08-14 MED ORDER — 0.9 % SODIUM CHLORIDE (POUR BTL) OPTIME
TOPICAL | Status: DC | PRN
  Administered 2021-08-14: 1000 mL

## 2021-08-14 MED ORDER — ONDANSETRON HCL 4 MG/2ML IJ SOLN
INTRAMUSCULAR | Status: AC
  Filled 2021-08-14: qty 2

## 2021-08-14 MED ORDER — SODIUM CHLORIDE 0.9 % IV SOLN: INTRAVENOUS | Status: DC | PRN

## 2021-08-14 MED ORDER — LIDOCAINE HCL (PF) 2 % IJ SOLN
INTRAMUSCULAR | Status: AC
  Filled 2021-08-14: qty 5

## 2021-08-14 MED ORDER — FENTANYL CITRATE (PF) 100 MCG/2ML IJ SOLN
25.0000 ug | INTRAMUSCULAR | Status: DC | PRN
  Administered 2021-08-14 (×3): 25 ug via INTRAVENOUS

## 2021-08-14 MED ORDER — ACETAMINOPHEN 10 MG/ML IV SOLN
INTRAVENOUS | Status: AC
  Administered 2021-08-14: 1000 mg via INTRAVENOUS
  Filled 2021-08-14: qty 100

## 2021-08-14 MED ORDER — EPHEDRINE 5 MG/ML INJ
INTRAVENOUS | Status: AC
  Filled 2021-08-14: qty 5

## 2021-08-14 MED ORDER — KETAMINE HCL 50 MG/5ML IJ SOSY
PREFILLED_SYRINGE | INTRAMUSCULAR | Status: AC
  Filled 2021-08-14: qty 5

## 2021-08-14 MED ORDER — PROPOFOL 10 MG/ML IV BOLUS
INTRAVENOUS | Status: AC
  Filled 2021-08-14: qty 20

## 2021-08-14 MED ORDER — EPHEDRINE SULFATE 50 MG/ML IJ SOLN
INTRAMUSCULAR | Status: DC | PRN
  Administered 2021-08-14 (×4): 5 mg via INTRAVENOUS

## 2021-08-14 MED ORDER — PHENYLEPHRINE HCL (PRESSORS) 10 MG/ML IV SOLN
INTRAVENOUS | Status: DC | PRN
  Administered 2021-08-14: 240 ug via INTRAVENOUS
  Administered 2021-08-14: 160 ug via INTRAVENOUS
  Administered 2021-08-14: 80 ug via INTRAVENOUS
  Administered 2021-08-14: 160 ug via INTRAVENOUS

## 2021-08-14 MED ORDER — SEVOFLURANE IN SOLN
RESPIRATORY_TRACT | Status: AC
  Filled 2021-08-14: qty 500

## 2021-08-14 MED ORDER — PHENYLEPHRINE HCL-NACL 20-0.9 MG/250ML-% IV SOLN
INTRAVENOUS | Status: AC
  Filled 2021-08-14: qty 250

## 2021-08-14 MED ORDER — LACTATED RINGERS IV SOLN: INTRAVENOUS | Status: DC

## 2021-08-14 MED ORDER — BUPIVACAINE HCL (PF) 0.5 % IJ SOLN
INTRAMUSCULAR | Status: DC | PRN
  Administered 2021-08-14: 30 mL

## 2021-08-14 MED ORDER — PROPOFOL 10 MG/ML IV BOLUS
INTRAVENOUS | Status: DC | PRN
  Administered 2021-08-14: 50 mg via INTRAVENOUS
  Administered 2021-08-14: 120 mg via INTRAVENOUS

## 2021-08-14 MED ORDER — INSULIN ASPART 100 UNIT/ML IJ SOLN
25.0000 [IU] | Freq: Once | INTRAMUSCULAR | Status: AC
  Administered 2021-08-14: 25 [IU] via SUBCUTANEOUS
  Filled 2021-08-14: qty 1

## 2021-08-14 MED ORDER — MIDAZOLAM HCL 2 MG/2ML IJ SOLN
INTRAMUSCULAR | Status: AC
  Filled 2021-08-14: qty 2

## 2021-08-14 MED ORDER — INSULIN GLARGINE-YFGN 100 UNIT/ML ~~LOC~~ SOLN
15.0000 [IU] | Freq: Every day | SUBCUTANEOUS | Status: DC
  Administered 2021-08-14 – 2021-08-16 (×3): 15 [IU] via SUBCUTANEOUS
  Filled 2021-08-14 (×5): qty 0.15

## 2021-08-14 MED ORDER — INSULIN ASPART 100 UNIT/ML IJ SOLN
5.0000 [IU] | Freq: Three times a day (TID) | INTRAMUSCULAR | Status: DC
  Administered 2021-08-14 – 2021-08-17 (×8): 5 [IU] via SUBCUTANEOUS
  Filled 2021-08-14 (×8): qty 1

## 2021-08-14 MED ORDER — VANCOMYCIN HCL 1250 MG/250ML IV SOLN
1250.0000 mg | INTRAVENOUS | Status: DC
  Administered 2021-08-14 – 2021-08-16 (×3): 1250 mg via INTRAVENOUS
  Filled 2021-08-14 (×4): qty 250

## 2021-08-14 MED ORDER — LIDOCAINE HCL (CARDIAC) PF 100 MG/5ML IV SOSY
PREFILLED_SYRINGE | INTRAVENOUS | Status: DC | PRN
Start: 2021-08-14 — End: 2021-08-14
  Administered 2021-08-14: 100 mg via INTRAVENOUS

## 2021-08-14 MED ORDER — INSULIN ASPART 100 UNIT/ML IJ SOLN
2.0000 [IU] | Freq: Once | INTRAMUSCULAR | Status: AC
  Administered 2021-08-14: 2 [IU] via SUBCUTANEOUS
  Filled 2021-08-14: qty 1

## 2021-08-14 MED ORDER — FENTANYL CITRATE (PF) 100 MCG/2ML IJ SOLN
INTRAMUSCULAR | Status: DC | PRN
Start: 2021-08-14 — End: 2021-08-14
  Administered 2021-08-14 (×4): 25 ug via INTRAVENOUS

## 2021-08-14 MED ORDER — ONDANSETRON HCL 4 MG/2ML IJ SOLN: 4.0000 mg | Freq: Once | INTRAMUSCULAR | Status: DC | PRN

## 2021-08-14 MED ORDER — INSULIN ASPART 100 UNIT/ML IJ SOLN: 1.0000 [IU] | Freq: Once | INTRAMUSCULAR | Status: DC

## 2021-08-14 MED ORDER — DEXAMETHASONE SODIUM PHOSPHATE 10 MG/ML IJ SOLN
INTRAMUSCULAR | Status: AC
  Filled 2021-08-14: qty 1

## 2021-08-14 MED ORDER — DEXAMETHASONE SODIUM PHOSPHATE 10 MG/ML IJ SOLN
INTRAMUSCULAR | Status: DC | PRN
  Administered 2021-08-14: 4 mg via INTRAVENOUS

## 2021-08-14 MED ORDER — ACETAMINOPHEN 10 MG/ML IV SOLN: 1000.0000 mg | Freq: Once | INTRAVENOUS | Status: DC | PRN

## 2021-08-14 MED ORDER — LACTATED RINGERS IR SOLN
Status: DC | PRN
Start: 2021-08-14 — End: 2021-08-14
  Administered 2021-08-14: 3000 mL

## 2021-08-14 MED ORDER — FENTANYL CITRATE (PF) 100 MCG/2ML IJ SOLN
INTRAMUSCULAR | Status: AC
  Administered 2021-08-14: 25 ug via INTRAVENOUS
  Filled 2021-08-14: qty 2

## 2021-08-14 MED ORDER — FENTANYL CITRATE (PF) 100 MCG/2ML IJ SOLN
INTRAMUSCULAR | Status: DC | PRN
Start: 2021-08-14 — End: 2021-08-14

## 2021-08-14 MED ORDER — OXYCODONE HCL 5 MG PO TABS: 5.0000 mg | ORAL_TABLET | Freq: Once | ORAL | Status: AC | PRN

## 2021-08-14 MED ORDER — FENTANYL CITRATE (PF) 100 MCG/2ML IJ SOLN
INTRAMUSCULAR | Status: AC
  Filled 2021-08-14: qty 2

## 2021-08-14 MED ORDER — OXYCODONE HCL 5 MG PO TABS
ORAL_TABLET | ORAL | Status: AC
  Administered 2021-08-14: 5 mg via ORAL
  Filled 2021-08-14: qty 1

## 2021-08-14 MED ORDER — OXYCODONE HCL 5 MG/5ML PO SOLN: 5.0000 mg | Freq: Once | ORAL | Status: AC | PRN

## 2021-08-14 MED ORDER — ONDANSETRON HCL 4 MG/2ML IJ SOLN
INTRAMUSCULAR | Status: DC | PRN
Start: 2021-08-14 — End: 2021-08-14
  Administered 2021-08-14: 4 mg via INTRAVENOUS

## 2021-08-14 MED ORDER — KETAMINE HCL 10 MG/ML IJ SOLN
INTRAMUSCULAR | Status: DC | PRN
  Administered 2021-08-14: 30 mg via INTRAVENOUS

## 2021-08-14 MED ORDER — MIDAZOLAM HCL 2 MG/2ML IJ SOLN
INTRAMUSCULAR | Status: DC | PRN
  Administered 2021-08-14: 2 mg via INTRAVENOUS

## 2021-08-14 SURGICAL SUPPLY — 42 items
"PENCIL ELECTRO HAND CTR " (MISCELLANEOUS) IMPLANT
BLADE MED AGGRESSIVE (BLADE) ×1 IMPLANT
BLADE SURG 10 STRL SS (BLADE) ×2 IMPLANT
BNDG CMPR STD VLCR NS LF 5.8X4 (GAUZE/BANDAGES/DRESSINGS) ×1
BNDG ELASTIC 4X5.8 VLCR NS LF (GAUZE/BANDAGES/DRESSINGS) ×1 IMPLANT
BNDG ESMARK 4X12 TAN STRL LF (GAUZE/BANDAGES/DRESSINGS) ×1 IMPLANT
BNDG GAUZE ELAST 4 BULKY (GAUZE/BANDAGES/DRESSINGS) ×2 IMPLANT
CUFF TOURN SGL QUICK 18X4 (TOURNIQUET CUFF) ×1 IMPLANT
DURAPREP 26ML APPLICATOR (WOUND CARE) ×2 IMPLANT
ELECT REM PT RETURN 9FT ADLT (ELECTROSURGICAL) ×2
ELECTRODE REM PT RTRN 9FT ADLT (ELECTROSURGICAL) IMPLANT
GAUZE 4X4 16PLY ~~LOC~~+RFID DBL (SPONGE) ×1 IMPLANT
GAUZE SPONGE 4X4 12PLY STRL (GAUZE/BANDAGES/DRESSINGS) ×1 IMPLANT
GLOVE SURG ENC MOIS LTX SZ7.5 (GLOVE) ×1 IMPLANT
GLOVE SURG SYN 8.0 (GLOVE) ×4 IMPLANT
GLOVE SURG SYN 8.0 PF PI (GLOVE) IMPLANT
GOWN STRL REUS W/ TWL LRG LVL3 (GOWN DISPOSABLE) IMPLANT
GOWN STRL REUS W/TWL LRG LVL3 (GOWN DISPOSABLE) ×4
HANDLE YANKAUER SUCT BULB TIP (MISCELLANEOUS) ×1 IMPLANT
IV NS IRRIG 3000ML ARTHROMATIC (IV SOLUTION) ×1 IMPLANT
KIT ASP BONE MRW 11G (KITS) ×1 IMPLANT
KIT TURNOVER KIT A (KITS) ×1 IMPLANT
MANIFOLD NEPTUNE II (INSTRUMENTS) ×1 IMPLANT
NDL HYPO 25X1 1.5 SAFETY (NEEDLE) IMPLANT
NEEDLE HYPO 25X1 1.5 SAFETY (NEEDLE) ×2 IMPLANT
NS IRRIG 1000ML POUR BTL (IV SOLUTION) ×1 IMPLANT
PACK EXTREMITY ARMC (MISCELLANEOUS) ×1 IMPLANT
PAD ABD DERMACEA PRESS 5X9 (GAUZE/BANDAGES/DRESSINGS) ×1 IMPLANT
PENCIL ELECTRO HAND CTR (MISCELLANEOUS) ×1 IMPLANT
PULSAVAC PLUS IRRIG FAN TIP (DISPOSABLE) ×2
SPONGE T-LAP 18X18 ~~LOC~~+RFID (SPONGE) ×1 IMPLANT
STAPLER SKIN PROX 35W (STAPLE) ×1 IMPLANT
STOCKINETTE STRL 6IN 960660 (GAUZE/BANDAGES/DRESSINGS) ×1 IMPLANT
SUT ETHILON 2 0 FSLX (SUTURE) ×4 IMPLANT
SUT ETHILON 3-0 FS-10 30 BLK (SUTURE)
SUT ETHILON 4-0 (SUTURE)
SUT ETHILON 4-0 FS2 18XMFL BLK (SUTURE)
SUT SILK 3 0 (SUTURE) ×2
SUT SILK 3-0 18XBRD TIE 12 (SUTURE) IMPLANT
SUTURE EHLN 3-0 FS-10 30 BLK (SUTURE) IMPLANT
SUTURE ETHLN 4-0 FS2 18XMF BLK (SUTURE) IMPLANT
TIP FAN IRRIG PULSAVAC PLUS (DISPOSABLE) IMPLANT

## 2021-08-14 NOTE — Interval H&P Note (Signed)
History and Physical Interval Note:  08/14/2021 12:02 PM  Jesus Flowers  has presented today for surgery, with the diagnosis of Osteomyelitis.  The various methods of treatment have been discussed with the patient and family. After consideration of risks, benefits and other options for treatment, the patient has consented to  Procedure(s): AMPUTATION FOOT (Right) INCISION AND DRAINAGE (Right) as a surgical intervention.  The patient's history has been reviewed, patient examined, no change in status, stable for surgery.  I have reviewed the patient's chart and labs.  Questions were answered to the patient's satisfaction.     Felecia Shelling

## 2021-08-14 NOTE — Transfer of Care (Signed)
Immediate Anesthesia Transfer of Care Note  Patient: Jesus Flowers  Procedure(s) Performed: AMPUTATION FOOT (Right: Foot) INCISION AND DRAINAGE (Right: Foot)  Patient Location: PACU  Anesthesia Type:General  Level of Consciousness: drowsy  Airway & Oxygen Therapy: Patient Spontanous Breathing and Patient connected to face mask oxygen  Post-op Assessment: Report given to RN and Post -op Vital signs reviewed and stable  Post vital signs: Reviewed and stable  Last Vitals:  Vitals Value Taken Time  BP 128/79 08/14/21 1347  Temp    Pulse 79 08/14/21 1352  Resp 16 08/14/21 1352  SpO2 100 % 08/14/21 1352  Vitals shown include unvalidated device data.  Last Pain:  Vitals:   08/14/21 1157  TempSrc: Temporal  PainSc: 7          Complications: No notable events documented.

## 2021-08-14 NOTE — TOC Progression Note (Signed)
Transition of Care [facility]) - Progression Note    Patient Details  Name: Jesus Flowers MRN: [id number] Date of Birth: 05-31-1966  [facility] of [facility]) CM/SW Contact  Marlowe Sax, RN Phone Number: 08/14/2021, 9:13 AM  Clinical Narrative:   Patient to have surgery today and TOC to follow to assist with DC planning, PT to eval after surgery         Expected Discharge Plan and Services                                                 Social Determinants of Health (SDOH) Interventions    Readmission Risk Interventions No flowsheet data found.

## 2021-08-14 NOTE — Progress Notes (Signed)
[name] NAME: Jesus Flowers    MR#:  [id number]  DATE OF BIRTH:  [birth year]  SUBJECTIVE:  Came in with worsening right foot and uncontrolled diabetes. He could not have outpatient follow-up due to lack of insurance. Continue to work 3 to 4 days after his previous surgery in October 2022. Started having increasing pain and right lower extremity came to the hospital for further evaluation management  patient surgery got canceled yesterday since he ate a light breakfast. Surgery plan for today.  REVIEW OF SYSTEMS:   Review of Systems  Constitutional:  Negative for chills, fever and weight loss.  HENT:  Negative for ear discharge, ear pain and nosebleeds.   Eyes:  Negative for blurred vision, pain and discharge.  Respiratory:  Negative for sputum production, shortness of breath, wheezing and stridor.   Cardiovascular:  Negative for chest pain, palpitations, orthopnea and PND.  Gastrointestinal:  Negative for abdominal pain, diarrhea, nausea and vomiting.  Genitourinary:  Negative for frequency and urgency.  Musculoskeletal:  Positive for joint pain. Negative for back pain.  Neurological:  Positive for weakness. Negative for sensory change, speech change and focal weakness.  Psychiatric/Behavioral:  Negative for depression and hallucinations. The patient is not nervous/anxious.   Tolerating Diet:npo Tolerating PT: pending  DRUG ALLERGIES:  No Known Allergies  VITALS:  Blood pressure 107/75, pulse 66, temperature (!) 97.4 F (36.3 C), resp. rate 16, height 5\' 6"  (1.676 m), weight 68.5 kg, SpO2 97 %.  PHYSICAL EXAMINATION:   Physical Exam  GENERAL:  56 y.o.-year-old patient lying in the bed with no acute distress.  HEENT: Head atraumatic, normocephalic. Oropharynx and nasopharynx clear.  LUNGS: Normal breath sounds bilaterally, no wheezing, rales, rhonchi.  CARDIOVASCULAR: S1, S2 normal. No murmurs, rubs, or gallops.  ABDOMEN:  Soft, nontender, nondistended. Bowel sounds present.  EXTREMITIES:  On 08/12/2021      NEUROLOGIC: nonfocal PSYCHIATRIC:  patient is alert and awake SKIN: as above  LABORATORY PANEL:  CBC Recent Labs  Lab 08/13/21 0438  WBC 16.7*  HGB 10.5*  HCT 29.7*  PLT 370     Chemistries  Recent Labs  Lab 08/12/21 1338 08/12/21 2012 08/13/21 0438 08/14/21 0411  NA 120*   < > 131*  --   K 4.5   < > 4.0  --   CL 86*   < > 97*  --   CO2 23   < > 25  --   GLUCOSE 709*   < > 144*  --   BUN 28*   < > 30*  --   CREATININE 1.13   < > 1.10 1.28*  CALCIUM 8.8*   < > 8.7*  --   AST 28  --   --   --   ALT 22  --   --   --   ALKPHOS 225*  --   --   --   BILITOT 1.0  --   --   --    < > = values in this interval not displayed.    Cardiac Enzymes No results for input(s): TROPONINI in the last 168 hours. RADIOLOGY:  MR ANKLE RIGHT WO CONTRAST  Result Date: 08/12/2021 CLINICAL DATA:  Diabetic foot pain. History of transmetatarsal amputation of the right foot on 05/09/2021 EXAM: MRI OF THE RIGHT ANKLE WITHOUT CONTRAST TECHNIQUE: Multiplanar, multisequence MR imaging of the ankle was performed. No intravenous contrast was administered. COMPARISON:  X-ray 08/12/2021 FINDINGS: TENDONS  Peroneal: Peroneus longus and brevis tendons are intact and normally positioned. Posteromedial: Tibialis posterior, flexor hallucis longus, and flexor digitorum longus tendons are intact and normally positioned. Mild tenosynovitis of the posteromedial ankle tendons. Anterior: Tibialis anterior, extensor hallucis longus, and extensor digitorum longus tendons are intact and normally positioned. Achilles: Intact. Plantar Fascia: Intact. LIGAMENTS Lateral: Intact tibiofibular ligaments. The anterior and posterior talofibular ligaments are intact. Intact calcaneofibular ligament. Medial: The deltoid and visualized portions of the spring ligament appear intact. CARTILAGE AND BONES Ankle Joint: No significant ankle joint  effusion. The talar dome and tibial plafond are intact. Subtalar Joints/Sinus Tarsi: No cartilage defect. Small posterior subtalar joint effusion. Preservation of the anatomic fat within the sinus tarsi. Bones: Postsurgical changes of transmetatarsal amputation of the first through fifth digits. There is prominent bone marrow edema within the residual second and third metatarsal bases with confluent low T1 signal changes along the resection margins compatible with osteomyelitis. Similar, slightly less pronounced findings are also present at the bases of the fourth and fifth metatarsals. Patchy bone marrow edema within the medial cuneiform without associated T1 marrow signal abnormality. Additional patchy sites of marrow edema within the midfoot and hindfoot, nonspecific. No acute fracture or dislocation. Other: Diffuse edema-like signal throughout the visualized musculature, may reflect myositis or denervation. Extensive soft tissue swelling and edema. No organized fluid collection. IMPRESSION: 1. Postsurgical changes of transmetatarsal amputation of the right foot. Findings of acute osteomyelitis involving the residual bases of the second and third metatarsals. 2. Probable early acute osteomyelitis involving the bases of the fourth and fifth metatarsals. 3. Bone marrow edema within the medial cuneiform may be reactive. 4. Extensive soft tissue swelling and edema. No organized fluid collection. 5. Diffuse edema-like signal throughout the visualized musculature, may reflect myositis or denervation. Electronically Signed   By: Davina Poke D.O.   On: 08/12/2021 20:08   DG Foot Complete Right  Result Date: 08/12/2021 CLINICAL DATA:  Open wound in the skin, pain EXAM: RIGHT FOOT COMPLETE - 3+ VIEW COMPARISON:  05/03/2021 FINDINGS: There is interval transmetatarsal amputation of right foot. No recent fracture or dislocation is seen. There are no focal lytic lesions. Arterial calcifications are seen in the soft  tissues. In the lateral view, there is linear area of low attenuation in the subcutaneous plane in the plantar aspect of the foot. Bony spurs seen in the talonavicular joint. Small smooth marginated calcifications at the attachment of Achilles tendon to the calcaneus may suggest calcific bursitis or tendinosis. IMPRESSION: Interval transmetatarsal amputation of right foot. No recent fracture or dislocation is seen. There is no focal lytic lesion. There is linear area of low attenuation in the soft tissues along the plantar aspect of right foot. Possibility of infectious process in the soft tissues is not excluded. Other findings as described in the body of the report. Electronically Signed   By: Elmer Picker M.D.   On: 08/12/2021 14:30   ASSESSMENT AND PLAN:  VERDELL MACGREGOR is a 56 y.o. male with medical history significant of hypertension, hyperlipidemia, diabetes mellitus, depression, who presents with right foot wound infection.  MRI Right foot  . Postsurgical changes of transmetatarsal amputation of the right foot. Findings of acute osteomyelitis involving the residual bases of the second and third metatarsals. 2. Probable early acute osteomyelitis involving the bases of the fourth and fifth metatarsals. 3. Bone marrow edema within the medial cuneiform may be reactive. 4. Extensive soft tissue swelling and edema. No organized fluid collection. 5. Diffuse edema-like  signal throughout the visualized musculature, may reflect myositis or denervation.  Osteomyelitis right foot s/p transmetatarsal amputation leukocytosis -- continue IV Zosyn and vancomycin -- podiatry consultation with Dr. Amalia Hailey appreciated. Patient is scheduled for Tarsometatarsal amputation --blood culture negative --1/17-- right foot amputation plan today per podiatry. Discussed with podiatry to see if antibiotics can be discontinued after surgery.  uncontrolled type II diabetes with severe hyperglycemia and diabetic  neuropathy -- A1c 12.3 -- continue long and short acting insulin. -- Patient has been noncompliant with diet. Discussed importance of diet and sugar control  Hypertension -- continue lisinopril  hyperlipidemia on pravastatin  Anxiety/depression -- continue fluoxetine  Procedures: Family communication : none Consults : podiatry CODE STATUS: full DVT Prophylaxis : Lovenox Level of care: Med-Surg Status is: Inpatient  Remains inpatient appropriate because: right foot osteomyelitis/infection pending surgery        TOTAL TIME TAKING CARE OF THIS PATIENT: 25 minutes.  >50% time spent on counselling and coordination of care  Note: This dictation was prepared with Dragon dictation along with smaller phrase technology. Any transcriptional errors that result from this process are unintentional.  Fritzi Mandes M.D    [facility]   CC: Primary care physician; [facility] [place] Patient ID: Jesus Flowers, male   DOB: Nov 10, 1965, 56 y.o.   MRN: [id number]

## 2021-08-14 NOTE — Op Note (Signed)
OPERATIVE REPORT Patient name: Jesus Flowers MRN: [id number] DOB: 07/11/1966  DOS: 08/14/21  Preop Dx: Osteomyelitis right foot Postop Dx: same  Procedure:  1.  Chopart amputation right foot  Surgeon: Edrick Kins DPM  Anesthesia: General anesthesia  Hemostasis: Calf tourniquet inflated to a pressure of 275mmHg without esmarch exsanguination   EBL: 10 mL Materials: None Injectables: 20 mL 0.5% Marcaine plain in a high ankle block fashion Pathology: Navicular bone sent to pathology  Condition: The patient tolerated the procedure and anesthesia well. No complications noted or reported   Justification for procedure: The patient is a 56 y.o. male PMHx diabetes mellitus uncontrolled who presents today for surgical correction of osteomyelitis with abscess to the right foot. All conservative modalities of been unsuccessful in providing any sort of satisfactory alleviation of symptoms with the patient. The patient was told benefits as well as possible side effects of the surgery. The patient consented for surgical correction. The patient consent form was reviewed. All patient questions were answered. No guarantees were expressed or implied. The patient and the surgeon both signed the patient consent form with the witness present and placed in the patient's chart.   Procedure in Detail: The patient was brought to the operating room, placed in the operating table in the supine position at which time an aseptic scrub and drape were performed about the patient's respective lower extremity after anesthesia was induced as described above. Attention was then directed to the surgical area where procedure number one commenced.  Procedure #1: Chopart amputation right foot The previous amputation stump to the right foot was circumscribed and a fishmouth type incision was planned and made proximal to the previous amputation stump.  Incision was carried down to the level of bone and the distal soft  tissue amputation stump was sharply excised and removed using a #10 scalpel.  This allowed exposure of the bases of the metatarsals as well as the mid tarsal bones.  Amputation began with disarticulation of the metatarsal bases at the TMT joint.  To allow for better closure of skin as well as to ensure no residual infection or osteomyelitis within the foot, decision was made to proceed with a more proximal Chopart amputation.  Soft tissue and ligamentous structures around the talonavicular and calcaneocuboid joint were released using a fresh #10 scalpel.  The midtarsal bones were distracted distally and removed in their entirety.  A portion of the navicular was placed in a sterile specimen container and sent to pathology.  This more proximal amputation did allow better reapproximation of skin edges and healthier tissue margins.    Attention was then directed to the soft tissue structures of the foot where there was a significant amount of very strong malodorous necrotic tissue along the plantar compartments of the foot tracking along the plantar fascia.  All of the necrotic nonviable tissue was sharply excised down to healthier tissue.  At this time pulse lavage with 3 L of normal saline was used for copious irrigation of the amputation stump.  Any open lumens or arteries visualized were either ligated or cauterized to ensure there was no excessive bleeding postoperatively.  The medial and lateral portions of the incision of the amputation stump were primarily reapproximated using wide stainless steel skin staples and reinforced with 2-0 nylon suture.  The central portion of the amputation stump was left open for drainage and packing.  Saline soaked gauze was packed within the amputation stump.  Dry sterile compressive dressings were then applied  to the patient's lower extremity. The tourniquet which was used for hemostasis was deflated.  No active bleeding noted  The patient was then transferred from the  operating room to the recovery room having tolerated the procedure and anesthesia well. All vital signs are stable. After a brief stay in the recovery room the patient was readmitted to inpatient room with adequate prescriptions for analgesia. Verbal as well as written instructions were provided for the patient regarding wound care. The patient is to keep the dressings clean dry and intact.  Nonweightbearing surgical extremity.  Podiatry will follow  Edrick Kins, DPM [facility]  Dr. Edrick Kins, DPM    [address], [place] [address]                Office [phone]  Fax [phone]

## 2021-08-14 NOTE — Brief Op Note (Signed)
08/14/2021  1:52 PM  PATIENT:  Janit Bern  56 y.o. male  PRE-OPERATIVE DIAGNOSIS:  Osteomyelitis  POST-OPERATIVE DIAGNOSIS:  Osteomyelitis  PROCEDURE:  Procedure(s): AMPUTATION FOOT (Right) INCISION AND DRAINAGE (Right)  SURGEON:  Surgeon(s) and Role:    Edrick Kins, DPM - Primary  PHYSICIAN ASSISTANT:   ASSISTANTS: none   ANESTHESIA:   general  EBL:  20 mL   BLOOD ADMINISTERED:none  DRAINS: none   LOCAL MEDICATIONS USED:  MARCAINE     SPECIMEN:  Source of Specimen:  RT foot  DISPOSITION OF SPECIMEN:  PATHOLOGY  COUNTS:  YES  TOURNIQUET:   Total Tourniquet Time Documented: Calf (Right) - 38 minutes Total: Calf (Right) - 38 minutes   DICTATION: .Viviann Spare Dictation  PLAN OF CARE: Admit to inpatient   PATIENT DISPOSITION:  PACU - hemodynamically stable.   Delay start of Pharmacological VTE agent (>24hrs) due to surgical blood loss or risk of bleeding: not applicable

## 2021-08-14 NOTE — Consult Note (Signed)
Pharmacy Antibiotic Note  Jesus Flowers is a 56 y.o. male with uncontrolled diabetes admitted on 08/12/2021 with right foot pain. Patient with previous transmetatarsal amputation. MRI with findings of acute osteomyelitis. Podiatry planning for revisional amputation of foot. Pharmacy has been consulted for vancomycin dosing.  Plan: Due to bump in creatinine, change vancomycin to 1250 mg IV Q 24 hrs. Patient did not receive dose of vancomycin 1/16; will schedule dose for this morning given it has been greater than 24 hours since initial dose. Goal AUC 400-550 Expected AUC: 476 SCr used: 1.28   Height: 5\' 6"  (167.6 cm) Weight: 68.5 kg (151 lb 0.2 oz) IBW/kg (Calculated) : 63.8  Temp (24hrs), Avg:98.4 F (36.9 C), Min:97.7 F (36.5 C), Max:99.3 F (37.4 C)  Recent Labs  Lab 08/12/21 1338 08/12/21 2012 08/13/21 0438 08/14/21 0411  WBC 21.0* 20.1* 16.7*  --   CREATININE 1.13 1.32*   1.18 1.10 1.28*  LATICACIDVEN 1.8 2.0*  --   --      Estimated Creatinine Clearance: 58.8 mL/min (A) (by C-G formula based on SCr of 1.28 mg/dL (H)).    No Known Allergies  Antimicrobials this admission: Zosyn 1/15 >>  Vancomycin 1/15 >>   Dose adjustments this admission:  1/17 Vancomycin 1500 mg q24h > 1250 mg q24h  Microbiology results: 1/15 BCx: NG   Thank you for allowing pharmacy to be a part of this patients care.  2/15, PharmD, BCPS Clinical Pharmacist 08/14/2021 7:45 AM

## 2021-08-14 NOTE — Plan of Care (Signed)

## 2021-08-15 LAB — CBC
HCT: 27.8 % — ABNORMAL LOW (ref 39.0–52.0)
Hemoglobin: 9.6 g/dL — ABNORMAL LOW (ref 13.0–17.0)
MCH: 31.5 pg (ref 26.0–34.0)
MCHC: 34.5 g/dL (ref 30.0–36.0)
MCV: 91.1 fL (ref 80.0–100.0)
Platelets: 367 10*3/uL (ref 150–400)
RBC: 3.05 MIL/uL — ABNORMAL LOW (ref 4.22–5.81)
RDW: 12 % (ref 11.5–15.5)
WBC: 20.7 10*3/uL — ABNORMAL HIGH (ref 4.0–10.5)
nRBC: 0 % (ref 0.0–0.2)

## 2021-08-15 LAB — GLUCOSE, CAPILLARY
Glucose-Capillary: 110 mg/dL — ABNORMAL HIGH (ref 70–99)
Glucose-Capillary: 146 mg/dL — ABNORMAL HIGH (ref 70–99)
Glucose-Capillary: 223 mg/dL — ABNORMAL HIGH (ref 70–99)
Glucose-Capillary: 244 mg/dL — ABNORMAL HIGH (ref 70–99)
Glucose-Capillary: 257 mg/dL — ABNORMAL HIGH (ref 70–99)

## 2021-08-15 LAB — BASIC METABOLIC PANEL
Anion gap: 7 (ref 5–15)
BUN: 26 mg/dL — ABNORMAL HIGH (ref 6–20)
CO2: 23 mmol/L (ref 22–32)
Calcium: 8.3 mg/dL — ABNORMAL LOW (ref 8.9–10.3)
Chloride: 99 mmol/L (ref 98–111)
Creatinine, Ser: 1.32 mg/dL — ABNORMAL HIGH (ref 0.61–1.24)
GFR, Estimated: >60 mL/min (ref >60)
Glucose, Bld: 247 mg/dL — ABNORMAL HIGH (ref 70–99)
Potassium: 4.2 mmol/L (ref 3.5–5.1)
Sodium: 129 mmol/L — ABNORMAL LOW (ref 135–145)

## 2021-08-15 LAB — CREATININE, SERUM
Creatinine, Ser: 1.25 mg/dL — ABNORMAL HIGH (ref 0.61–1.24)
GFR, Estimated: >60 mL/min (ref >60)

## 2021-08-15 LAB — MAGNESIUM: Magnesium: 2.2 mg/dL (ref 1.7–2.4)

## 2021-08-15 NOTE — Progress Notes (Signed)
°  Subjective:  Patient ID: NERY FRAPPIER, male    DOB: Dec 05, 1965,  MRN: [id number]  Overall feeling well pain is improving, had PT today  Negative for chest pain and shortness of breath Objective:   Vitals:   08/15/21 1105 08/15/21 1511  BP: 104/74 103/74  Pulse: 68 79  Resp: 17 16  Temp: 98.2 F (36.8 C) 98.1 F (36.7 C)  SpO2: 100% 98%   General AA&O x3. Normal mood and affect.  Vascular Dorsalis pedis and posterior tibial pulses 2/4 bilat. Brisk capillary refill to all digits. Pedal hair present.  Neurologic Epicritic sensation grossly intact.  Dermatologic Dressing clean dry and intact.  Incision coapted sutures intact.  No signs of infection or further necrosis  Orthopedic: MMT 5/5 in     Assessment & Plan:  Patient was evaluated and treated and all questions answered.  POD 1 chopart amputation right foot -NWB to RLE -Dressing changed no signs of necrosis or further infection noted -He will need some dressing supplies for home -Recommend change every 3 days with gauze packed into dorsal portion of wound -Recommend 2 weeks of doxycycline p.o. -We will follow-up on pathology results as outpatient -Okay to DC from our standpoint when ready -We will have his follow-up scheduled, I discussed with him that he does not need to pay for his postop care during the global period and he will come to the appointment and money is not a concern that he should be worried about from our standpoint  [name], DPM  Accessible via secure chat for questions or concerns.

## 2021-08-15 NOTE — Progress Notes (Signed)
Chaplain offered emotional support and pray for healing. Patient advised of chaplain services 24 hrs.

## 2021-08-15 NOTE — Evaluation (Signed)
Physical Therapy Evaluation Patient Details Name: Jesus Flowers MRN: [id number] DOB: 1966/05/14 Today's Date: 08/15/2021  History of Present Illness  Jesus Flowers is a 56 y.o. male with diabetes who comes in with right foot pain x2 months.  Patient reports pain is sharp shooting.  Patient has had all of his toes on his affected foot amputated 2 to 3 months ago.   Clinical Impression  Pt admitted with above diagnosis. Pt received in recliner agreeable to PT. No interpreter needed. Pt endorses being indep at baseline with SPC prior to admission then no device since previous admission. Pt educated on NWB'ing precautions as pt initially thought he could rest the post part of the limb on the floor. Pt able to stand with supervision and perform a hop-to pattern with RW 100' with safe mobility, no LOB, and x1 standing rest break throughout bout. Pt declining stair training today, requesting to perform next PT treatment. Pt returned to recliner with education on limb elevated inflammation and LE therex for strengthening/inflammation. Pt reports having intermittent family support in son as he works on second shift but he lives with pt. All needs in reach. Pt safe to d/c home pending stairs assessment, with HHPT services. Pt currently with functional limitations due to the deficits listed below (see PT Problem List). Pt will benefit from skilled PT to increase their independence and safety with mobility to allow discharge to the venue listed below.      Recommendations for follow up therapy are one component of a multi-disciplinary discharge planning process, led by the attending physician.  Recommendations may be updated based on patient status, additional functional criteria and insurance authorization.  Follow Up Recommendations Home health PT    Assistance Recommended at Discharge Set up Supervision/Assistance  Patient can return home with the following  A little help with walking and/or  transfers;Assistance with cooking/housework;Assist for transportation;Help with stairs or ramp for entrance    Equipment Recommendations None recommended by PT  Recommendations for Other Services       Functional Status Assessment Patient has had a recent decline in their functional status and demonstrates the ability to make significant improvements in function in a reasonable and predictable amount of time.     Precautions / Restrictions Precautions Precautions: Other (comment) Restrictions Weight Bearing Restrictions: Yes RLE Weight Bearing: Non weight bearing      Mobility  Bed Mobility                 Patient Response: Cooperative  Transfers                   General transfer comment: NT. In recliner pre and post session.    Ambulation/Gait Ambulation/Gait assistance: Supervision Gait Distance (Feet): 100 Feet Assistive device: Rolling walker (2 wheels)         General Gait Details: Hop-to pattern. Good understanding of WB'ing precautions.  Stairs            Wheelchair Mobility    Modified Rankin (Stroke Patients Only)       Balance Overall balance assessment: Needs assistance Sitting-balance support: No upper extremity supported (L foot supported) Sitting balance-Leahy Scale: Good     Standing balance support: During functional activity, Reliant on assistive device for balance Standing balance-Leahy Scale: Fair Standing balance comment: Use of RW to maintain precautions.  Pertinent Vitals/Pain Pain Assessment Pain Assessment: Faces Faces Pain Scale: Hurts a little bit Pain Location: R foot Pain Intervention(s): Limited activity within patient's tolerance, Monitored during session, Repositioned, Premedicated before session    Home Living Family/patient expects to be discharged to:: Private residence Living Arrangements: Children Available Help at Discharge: Available  PRN/intermittently Type of Home: House Home Access: Stairs to enter Entrance Stairs-Rails: Right;Left;Can reach both Entrance Stairs-Number of Steps: 3   Home Layout: One level Home Equipment: Agricultural consultant (2 wheels);Cane - single point;Tub bench      Prior Function Prior Level of Function : Independent/Modified Independent                     Hand Dominance        Extremity/Trunk Assessment   Upper Extremity Assessment Upper Extremity Assessment: Overall WFL for tasks assessed    Lower Extremity Assessment Lower Extremity Assessment: RLE deficits/detail RLE Deficits / Details: Chopart amputation    Cervical / Trunk Assessment Cervical / Trunk Assessment: Normal  Communication   Communication: No difficulties  Cognition Arousal/Alertness: Awake/alert Behavior During Therapy: WFL for tasks assessed/performed Overall Cognitive Status: Within Functional Limits for tasks assessed                                          General Comments      Exercises General Exercises - Lower Extremity Long Arc Quad: AROM, Seated, Both, 5 reps Hip Flexion/Marching: AROM, Seated, Strengthening, Both, 5 reps Other Exercises Other Exercises: Role of PT in acute setting, DME, D/c recs, WB'ing precautions.   Assessment/Plan    PT Assessment Patient needs continued PT services  PT Problem List Decreased strength;Decreased activity tolerance;Decreased balance       PT Treatment Interventions DME instruction;Therapeutic exercise;Gait training;Balance training;Stair training;Neuromuscular re-education;Functional mobility training;Therapeutic activities;Patient/family education    PT Goals (Current goals can be found in the Care Plan section)  Acute Rehab PT Goals Patient Stated Goal: to return home PT Goal Formulation: With patient Time For Goal Achievement: 08/29/21 Potential to Achieve Goals: Good    Frequency Min 2X/week     Co-evaluation                AM-PAC PT "6 Clicks" Mobility  Outcome Measure Help needed turning from your back to your side while in a flat bed without using bedrails?: None Help needed moving from lying on your back to sitting on the side of a flat bed without using bedrails?: None Help needed moving to and from a bed to a chair (including a wheelchair)?: A Little Help needed standing up from a chair using your arms (e.g., wheelchair or bedside chair)?: A Little Help needed to walk in hospital room?: A Little Help needed climbing 3-5 steps with a railing? : A Little 6 Click Score: 20    End of Session Equipment Utilized During Treatment: Gait belt Activity Tolerance: Patient tolerated treatment well Patient left: in chair;with call bell/phone within reach Nurse Communication: Mobility status PT Visit Diagnosis: Unsteadiness on feet (R26.81);Muscle weakness (generalized) (M62.81)    Time: 1301-1340 PT Time Calculation (min) (ACUTE ONLY): 39 min   Charges:   PT Evaluation $PT Eval Low Complexity: 1 Low PT Treatments $Gait Training: 8-22 mins       Delphia Grates. Fairly IV, PT, DPT Physical Therapist- [place]  [facility]  08/15/2021, 2:53 PM

## 2021-08-15 NOTE — Plan of Care (Signed)

## 2021-08-15 NOTE — Progress Notes (Signed)
PROGRESS NOTE    Jesus Flowers  WUJ:[id number] DOB: 02/20/1966 DOA: 08/12/2021 [facility]  133A/133A-AA   Assessment & Plan:   Principal Problem:   Hyperosmolar non-ketotic state in patient with type 2 diabetes mellitus (HCC) Active Problems:   Osteomyelitis of right foot (HCC)   Depression   HTN (hypertension)   Hyperglycemia due to type 2 diabetes mellitus (HCC)   Hyponatremia with increased serum osmolality   Osteomyelitis (HCC)   Jesus Flowers is a 56 y.o. male with medical history significant of hypertension, hyperlipidemia, diabetes mellitus, depression, who presents with right foot wound infection.   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   uncontrolled type II diabetes with severe hyperglycemia and diabetic neuropathy -- A1c 12.3 --cont glargine 15u nightly --mealtime 5u TID --SSI TID   Hypertension --cont lisinopril   hyperlipidemia  --cont statin   Anxiety/depression -- continue fluoxetine   DVT prophylaxis: Lovenox SQ Code Status: Full code  Family Communication:  Level of care: Med-Surg Dispo:   The patient is from: home Anticipated d/c is to: home Anticipated d/c date is: likely tomorrow Patient currently is not medically ready to d/c due to: need podiatry to clear for discharge   Subjective and Interval History:  Pt reported some pain that's controlled with current pain meds.  Normal oral intake.     Objective: Vitals:   08/15/21 0730 08/15/21 1105 08/15/21 1511 08/15/21 1939  BP: 134/83 104/74 103/74 108/72  Pulse: 73 68 79 72  Resp: 18 17 16 20   Temp: 98.5 F (36.9 C) 98.2 F (36.8 C) 98.1 F (36.7 C) 98 F (36.7 C)  TempSrc: Oral     SpO2: 98% 100% 98% 99%  Weight:      Height:        Intake/Output Summary (Last 24 hours) at 08/15/2021 2022 Last data filed at 08/15/2021 1819 Gross per  24 hour  Intake 1820 ml  Output 1750 ml  Net 70 ml   Filed Weights   08/12/21 1827 08/13/21 1706  Weight: 68.5 kg 68.5 kg    Examination:   Constitutional: NAD, AAOx3 HEENT: conjunctivae and lids normal, EOMI CV: No cyanosis.   RESP: normal respiratory effort, on RA Extremities: right foot stump wrapped SKIN: warm, dry Neuro: II - XII grossly intact.   Psych: Normal mood and affect.  Appropriate judgement and reason   Data Reviewed: I have personally reviewed following labs and imaging studies  CBC: Recent Labs  Lab 08/12/21 1338 08/12/21 2012 08/13/21 0438 08/15/21 1109  WBC 21.0* 20.1* 16.7* 20.7*  NEUTROABS 19.3*  --   --   --   HGB 11.9* 10.6* 10.5* 9.6*  HCT 34.9* 30.0* 29.7* 27.8*  MCV 92.1 90.9 89.7 91.1  PLT 392 366 370 367   Basic Metabolic Panel: Recent Labs  Lab 08/12/21 1338 08/12/21 2012 08/13/21 0438 08/14/21 0411 08/15/21 0538 08/15/21 1109  NA 120* 128* 131*  --   --  129*  K 4.5 4.0 4.0  --   --  4.2  CL 86* 94* 97*  --   --  99  CO2 23 25 25   --   --  23  GLUCOSE 709* 255* 144*  --   --  247*  BUN 28* 26* 30*  --   --  26*  CREATININE 1.13 1.32*   1.18 1.10 1.28* 1.25* 1.32*  CALCIUM 8.8* 8.6* 8.7*  --   --  8.3*  MG  --   --   --   --   --  2.2   GFR: Estimated Creatinine Clearance: 57.1 mL/min (A) (by C-G formula based on SCr of 1.32 mg/dL (H)). Liver Function Tests: Recent Labs  Lab 08/12/21 1338  AST 28  ALT 22  ALKPHOS 225*  BILITOT 1.0  PROT 8.1  ALBUMIN 3.2*   No results for input(s): LIPASE, AMYLASE in the last 168 hours. No results for input(s): AMMONIA in the last 168 hours. Coagulation Profile: No results for input(s): INR, PROTIME in the last 168 hours. Cardiac Enzymes: No results for input(s): CKTOTAL, CKMB, CKMBINDEX, TROPONINI in the last 168 hours. BNP (last 3 results) No results for input(s): PROBNP in the last 8760 hours. HbA1C: No results for input(s): HGBA1C in the last 72 hours. CBG: Recent Labs   Lab 08/15/21 0028 08/15/21 0722 08/15/21 1106 08/15/21 1623 08/15/21 1943  GLUCAP 257* 244* 223* 110* 146*   Lipid Profile: No results for input(s): CHOL, HDL, LDLCALC, TRIG, CHOLHDL, LDLDIRECT in the last 72 hours. Thyroid Function Tests: No results for input(s): TSH, T4TOTAL, FREET4, T3FREE, THYROIDAB in the last 72 hours. Anemia Panel: No results for input(s): VITAMINB12, FOLATE, FERRITIN, TIBC, IRON, RETICCTPCT in the last 72 hours. Sepsis Labs: Recent Labs  Lab 08/12/21 1338 08/12/21 2012  LATICACIDVEN 1.8 2.0*    Recent Results (from the past 240 hour(s))  Blood culture (routine x 2)     Status: None (Preliminary result)   Collection Time: 08/12/21  5:01 PM   Specimen: BLOOD  Result Value Ref Range Status   Specimen Description BLOOD RIGHT AC  Final   Special Requests   Final    BOTTLES DRAWN AEROBIC AND ANAEROBIC Blood Culture results may not be optimal due to an inadequate volume of blood received in culture bottles   Culture   Final    NO GROWTH 3 DAYS Performed at [facility], [address], [place], [place] [address]    Report Status PENDING  Incomplete  Blood culture (routine x 2)     Status: None (Preliminary result)   Collection Time: 08/12/21  5:01 PM   Specimen: BLOOD  Result Value Ref Range Status   Specimen Description BLOOD RIGHT HAND  Final   Special Requests   Final    BOTTLES DRAWN AEROBIC AND ANAEROBIC Blood Culture adequate volume   Culture   Final    NO GROWTH 3 DAYS Performed at [facility], [address] [place], [place] [address]    Report Status PENDING  Incomplete  Resp Panel by RT-PCR (Flu A&B, Covid) Nasopharyngeal Swab     Status: None   Collection Time: 08/12/21 10:43 PM   Specimen: Nasopharyngeal Swab; Nasopharyngeal(NP) swabs in vial transport medium  Result Value Ref Range Status   SARS Coronavirus 2 by RT PCR NEGATIVE NEGATIVE Final    Comment: (NOTE) SARS-CoV-2 target nucleic acids are NOT  DETECTED.  The SARS-CoV-2 RNA is generally detectable in upper respiratory specimens during the acute phase of infection. The lowest concentration of SARS-CoV-2 viral copies this assay can detect is 138 copies/mL. A negative result does not preclude SARS-Cov-2 infection and should not be used as the sole basis for treatment or other patient management decisions. A negative result may occur with  improper specimen collection/handling, submission of specimen other than nasopharyngeal swab, presence of viral mutation(s) within the areas targeted by this assay, and inadequate  number of viral copies(<138 copies/mL). A negative result must be combined with clinical observations, patient history, and epidemiological information. The expected result is Negative.  Fact Sheet for Patients:  BloggerCourse.comhttps://www.fda.gov/media/152166/download  Fact Sheet for Healthcare Providers:  SeriousBroker.ithttps://www.fda.gov/media/152162/download  This test is no t yet approved or cleared by the [place] FDA and  has been authorized for detection and/or diagnosis of SARS-CoV-2 by FDA under an Emergency Use Authorization (EUA). This EUA will remain  in effect (meaning this test can be used) for the duration of the COVID-19 declaration under Section 564(b)(1) of the Act, 21 U.S.C.section 360bbb-3(b)(1), unless the authorization is terminated  or revoked sooner.       Influenza A by PCR NEGATIVE NEGATIVE Final   Influenza B by PCR NEGATIVE NEGATIVE Final    Comment: (NOTE) The Xpert Xpress SARS-CoV-2/FLU/RSV plus assay is intended as an aid in the diagnosis of influenza from Nasopharyngeal swab specimens and should not be used as a sole basis for treatment. Nasal washings and aspirates are unacceptable for Xpert Xpress SARS-CoV-2/FLU/RSV testing.  Fact Sheet for Patients: BloggerCourse.comhttps://www.fda.gov/media/152166/download  Fact Sheet for Healthcare Providers: SeriousBroker.ithttps://www.fda.gov/media/152162/download  This test is not yet  approved or cleared by the [place] FDA and has been authorized for detection and/or diagnosis of SARS-CoV-2 by FDA under an Emergency Use Authorization (EUA). This EUA will remain in effect (meaning this test can be used) for the duration of the COVID-19 declaration under Section 564(b)(1) of the Act, 21 U.S.C. section 360bbb-3(b)(1), unless the authorization is terminated or revoked.  Performed at [facility], [address] [place], [place] [address]   Surgical pcr screen     Status: Abnormal   Collection Time: 08/12/21 11:37 PM   Specimen: Nasal Mucosa; Nasal Swab  Result Value Ref Range Status   MRSA, PCR NEGATIVE NEGATIVE Final   Staphylococcus aureus POSITIVE (A) NEGATIVE Final    Comment: (NOTE) The Xpert SA Assay (FDA approved for NASAL specimens in patients 56 years of age and older), is one component of a comprehensive surveillance program. It is not intended to diagnose infection nor to guide or monitor treatment. Performed at [facility], [address] [place], [place] [address]       Radiology Studies: DG Foot Complete Right  Result Date: 08/14/2021 CLINICAL DATA:  Postop amputation. EXAM: RIGHT FOOT COMPLETE - 3+ VIEW COMPARISON:  08/12/2021 FINDINGS: Interval amputation of the RIGHT foot, sparing the calcaneus and talus. Soft tissue edema and irregularity, with sutures noted. IMPRESSION: Postoperative appearance of the RIGHT foot following amputation. Electronically Signed   By: Norva PavlovElizabeth  Brown M.D.   On: 08/14/2021 14:31     Scheduled Meds:  Chlorhexidine Gluconate Cloth  6 each Topical Q0600   enoxaparin (LOVENOX) injection  40 mg Subcutaneous Q24H   FLUoxetine  40 mg Oral Daily   insulin aspart  0-15 Units Subcutaneous TID WC   insulin aspart  0-5 Units Subcutaneous QHS   insulin aspart  5 Units Subcutaneous TID WC   insulin glargine-yfgn  15 Units Subcutaneous QHS   lisinopril  10 mg Oral Daily   mupirocin ointment  1  application Nasal BID   pravastatin  20 mg Oral q1800   sodium chloride flush  3 mL Intravenous Q12H   sodium chloride flush  3 mL Intravenous Q12H   Continuous Infusions:  sodium chloride 250 mL (08/13/21 1251)   piperacillin-tazobactam 3.375 g (08/15/21 1305)   vancomycin Stopped (08/15/21 1139)     LOS: 3 days  Darlin Priestly, MD [facility] If 7PM-7AM, please contact night-coverage 08/15/2021, 8:22 PM

## 2021-08-15 NOTE — Progress Notes (Signed)
When rounding on pt around 0030, he became very tearful and emotional. He expressed to me his feelings about his life, the struggles he was facing, and how he was sad. I asked him if he would like to speak to a chaplain about these feelings, he said no because he was talking to me. I told the patient while I am here to listen, the chaplain would be a better resource for him. He expressed to me several times that he did not want to kill himself because he loves his children and God. After talking with the charge RN we both agreed it was appropriate to put in a chaplain consult.

## 2021-08-16 ENCOUNTER — Encounter: Payer: Self-pay | Admitting: Podiatry

## 2021-08-16 LAB — MAGNESIUM: Magnesium: 2 mg/dL (ref 1.7–2.4)

## 2021-08-16 LAB — BASIC METABOLIC PANEL
Anion gap: 6 (ref 5–15)
BUN: 29 mg/dL — ABNORMAL HIGH (ref 6–20)
CO2: 26 mmol/L (ref 22–32)
Calcium: 8.3 mg/dL — ABNORMAL LOW (ref 8.9–10.3)
Chloride: 100 mmol/L (ref 98–111)
Creatinine, Ser: 1.37 mg/dL — ABNORMAL HIGH (ref 0.61–1.24)
GFR, Estimated: >60 mL/min (ref >60)
Glucose, Bld: 212 mg/dL — ABNORMAL HIGH (ref 70–99)
Potassium: 4.8 mmol/L (ref 3.5–5.1)
Sodium: 132 mmol/L — ABNORMAL LOW (ref 135–145)

## 2021-08-16 LAB — CBC
HCT: 27.2 % — ABNORMAL LOW (ref 39.0–52.0)
Hemoglobin: 9.1 g/dL — ABNORMAL LOW (ref 13.0–17.0)
MCH: 30.7 pg (ref 26.0–34.0)
MCHC: 33.5 g/dL (ref 30.0–36.0)
MCV: 91.9 fL (ref 80.0–100.0)
Platelets: 382 10*3/uL (ref 150–400)
RBC: 2.96 MIL/uL — ABNORMAL LOW (ref 4.22–5.81)
RDW: 12.1 % (ref 11.5–15.5)
WBC: 14.4 10*3/uL — ABNORMAL HIGH (ref 4.0–10.5)
nRBC: 0 % (ref 0.0–0.2)

## 2021-08-16 LAB — GLUCOSE, CAPILLARY
Glucose-Capillary: 176 mg/dL — ABNORMAL HIGH (ref 70–99)
Glucose-Capillary: 134 mg/dL — ABNORMAL HIGH (ref 70–99)
Glucose-Capillary: 115 mg/dL — ABNORMAL HIGH (ref 70–99)
Glucose-Capillary: 126 mg/dL — ABNORMAL HIGH (ref 70–99)

## 2021-08-16 MED ORDER — AMOXICILLIN-POT CLAVULANATE 875-125 MG PO TABS: 1.0000 | ORAL_TABLET | Freq: Two times a day (BID) | ORAL | 0 refills | Status: AC

## 2021-08-16 MED ORDER — INSULIN GLARGINE 100 UNIT/ML ~~LOC~~ SOLN: 15.0000 [IU] | Freq: Every day | SUBCUTANEOUS | 2 refills | Status: DC

## 2021-08-16 MED ORDER — DOXYCYCLINE HYCLATE 100 MG PO TABS: 100.0000 mg | ORAL_TABLET | Freq: Two times a day (BID) | ORAL | 0 refills | Status: DC

## 2021-08-16 MED ORDER — AMOXICILLIN-POT CLAVULANATE 875-125 MG PO TABS
1.0000 | ORAL_TABLET | Freq: Two times a day (BID) | ORAL | Status: DC
  Administered 2021-08-16 – 2021-08-17 (×2): 1 via ORAL
  Filled 2021-08-16 (×2): qty 1

## 2021-08-16 MED ORDER — MORPHINE SULFATE 15 MG PO TABS
15.0000 mg | ORAL_TABLET | ORAL | Status: DC | PRN
  Administered 2021-08-16 (×3): 15 mg via ORAL
  Filled 2021-08-16 (×3): qty 1

## 2021-08-16 MED ORDER — OXYCODONE-ACETAMINOPHEN 5-325 MG PO TABS: 1.0000 | ORAL_TABLET | Freq: Four times a day (QID) | ORAL | 0 refills | Status: AC | PRN

## 2021-08-16 NOTE — TOC Transition Note (Signed)
Transition of Care [facility]) - CM/SW Discharge Note   Patient Details  Name: Jesus Flowers MRN: [id number] Date of Birth: 01/23/1966  Transition of Care [facility]) CM/SW Contact:  Margarito Liner, LCSW Phone Number: 08/16/2021, 12:11 PM   Clinical Narrative:  Patient has orders to discharge home today. [facility] can accept for PT and RN. Cannot start until Monday or Tuesday of next week. Patient is aware and said he or his son could do the wound care in the meantime. Per notes, sending him home with wound care supplies. PT recommending crutches. Ordered charity crutches through [employer]. No further concerns. CSW signing off.  Final next level of care: Home w Home Health Services Barriers to Discharge: Barriers Resolved   Patient Goals and CMS Choice     Choice offered to / list presented to : Patient  Discharge Placement                Patient to be transferred to facility by: Son will take him home.   Patient and family notified of of transfer: 08/16/21  Discharge Plan and Services     Post Acute Care Choice: Home Health          DME Arranged: Crutches DME Agency: [employer] Date DME Agency Contacted: 08/16/21   Representative spoke with at DME Agency: Bjorn Loser HH Arranged: PT, RN [facility] Agency: [facility] Date [facility] Agency Contacted: 08/16/21   Representative spoke with at [facility] Agency: Evorn Gong  Social Determinants of Health (SDOH) Interventions     Readmission Risk Interventions No flowsheet data found.

## 2021-08-16 NOTE — TOC Initial Note (Signed)
Transition of Care [facility]) - Initial/Assessment Note    Patient Details  Name: KEMAR PANDIT MRN: [id number] Date of Birth: 1965/12/23  Transition of Care [facility]) CM/SW Contact:    Candie Chroman, LCSW Phone Number: 08/16/2021, 10:10 AM  Clinical Narrative:  CSW met with patient. No supports at bedside. CSW introduced role and explained that discharge planning would be discussed. Patient is agreeable to home health PT and RN. He confirmed he does not have insurance. [place] this week is [facility]. They are reviewing referral. No DME recommendations. Patient confirmed he already has a walker at home. His PCP is at the [facility]. He does not see a specific provider, rather whoever is available. Patient said his son can pick him up around 3:00/4:00 today. No further concerns. CSW encouraged patient to contact CSW as needed. CSW will continue to follow patient and facilitate return home today.                Expected Discharge Plan: [place] Barriers to Discharge:  (Finding charity [facility])   Patient Goals and CMS Choice        Expected Discharge Plan and Services Expected Discharge Plan: [place] Choice: [place] arrangements for the past 2 months: Single Family Home Expected Discharge Date: 08/16/21                                    Prior Living Arrangements/Services Living arrangements for the past 2 months: Single Family Home Lives with:: Adult Children Patient language and need for interpreter reviewed:: Yes Do you feel safe going back to the place where you live?: Yes      Need for Family Participation in Patient Care: Yes (Comment) Care giver support system in place?: Yes (comment) Current home services: DME Criminal Activity/Legal Involvement Pertinent to Current Situation/Hospitalization: No - Comment as needed  Activities of Daily Living Home Assistive  Devices/Equipment: None ADL Screening (condition at time of admission) Patient's cognitive ability adequate to safely complete daily activities?: Yes Is the patient deaf or have difficulty hearing?: No Does the patient have difficulty seeing, even when wearing glasses/contacts?: No Does the patient have difficulty concentrating, remembering, or making decisions?: No Patient able to express need for assistance with ADLs?: Yes Does the patient have difficulty dressing or bathing?: No Independently performs ADLs?: Yes (appropriate for developmental age) Does the patient have difficulty walking or climbing stairs?: No Weakness of Legs: None Weakness of Arms/Hands: None  Permission Sought/Granted Permission sought to share information with : Facility Art therapist granted to share information with : Yes, Verbal Permission Granted     Permission granted to share info w AGENCY: Home Health Agencies        Emotional Assessment Appearance:: Appears stated age Attitude/Demeanor/Rapport: Engaged, Gracious Affect (typically observed): Accepting, Appropriate, Calm, Pleasant Orientation: : Oriented to Self, Oriented to Place, Oriented to  Time, Oriented to Situation Alcohol / Substance Use: Not Applicable Psych Involvement: No (comment)  Admission diagnosis:  Osteomyelitis (HCC) [M86.9] Hyperglycemia [R73.9] Foot infection [L08.9] Hyperosmolar non-ketotic state in patient with type 2 diabetes mellitus ([place]) [E11.00] Sepsis, due to unspecified organism, unspecified whether acute organ dysfunction present [facility]) [A41.9] Patient Active Problem List   Diagnosis Date Noted   Hyperglycemia due to type 2 diabetes mellitus ([place]) 08/12/2021   Hyponatremia with  increased serum osmolality 08/12/2021   Hyperosmolar non-ketotic state in patient with type 2 diabetes mellitus ([place]) 08/12/2021   Osteomyelitis ([place]) 08/12/2021   Malnutrition of moderate degree 05/11/2021   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 ([place]) 05/09/2021   Diabetic peripheral neuropathy ([place]) 05/09/2021   Osteomyelitis of right foot ([place]) 05/03/2021   High cholesterol    Diabetes mellitus without complication ([place])    Depression    Sepsis ([place])    AKI (acute kidney injury) ([place])    HTN (hypertension)    PCP:  [facility] [place]:   [place], [place] [address] Phone: [phone] Fax: [place], [place] [address] Phone: [phone] Fax: [phone]  [place], [place] [address] Phone: [phone] Fax: [phone]     Social Determinants of Health (SDOH) Interventions    Readmission Risk Interventions No flowsheet data found.

## 2021-08-16 NOTE — Progress Notes (Signed)
°  Subjective:  Patient ID: Jesus Flowers, male    DOB: Apr 14, 1966,  MRN: [id number]  Overall feeling little bit of pain but still in good spirit. No other complaints   Negative for chest pain and shortness of breath Objective:   Vitals:   08/16/21 0422 08/16/21 0741  BP: 130/83 (!) 144/97  Pulse: 72 77  Resp: 20 17  Temp: 98.3 F (36.8 C) 98.4 F (36.9 C)  SpO2: 99% 98%   General AA&O x3. Normal mood and affect.  Vascular Dorsalis pedis and posterior tibial pulses 2/4 bilat. Brisk capillary refill to all digits. Pedal hair present.  Neurologic Epicritic sensation grossly intact.  Dermatologic Dressing clean dry and intact.  No calf pain. Motor sensory fucntions are intact   No signs of infection or further necrosis  Orthopedic: MMT 5/5 in     Assessment & Plan:  Patient was evaluated and treated and all questions answered.  POD 1 chopart amputation right foot -NWB to RLE -He will need some dressing supplies for home -Recommend change every 3 days with gauze packed into dorsal portion of wound -Recommend 2 weeks of doxycycline p.o. -We will follow-up on pathology results as outpatient -Okay to DC from our standpoint when ready -He will follow up one week from DC.   Candelaria Stagers, DPM  Accessible via secure chat for questions or concerns.

## 2021-08-16 NOTE — Progress Notes (Signed)
Physical Therapy Treatment Patient Details Name: Jesus Flowers MRN: [id number] DOB: [birth year] Today's Date: 08/16/2021   History of Present Illness Jesus Flowers is a 56 y.o. male with diabetes who comes in with right foot pain x2 months.  Patient reports pain is sharp shooting.  Patient has had all of his toes on his affected foot amputated 2 to 3 months ago.    PT Comments    Pt received in recliner agreeable to PT. Pt remains supervision to stand to RW and ambulate to PT gym with good compliance to NWB precautions.  PT demo and education on stair training performed. PT displaying difficulty with asc with single rail use on both sides due to UE weakness. Pt displaying most successful technique with asc with L rail with BUE support. Pt requiring multiple efforts/bouts to hop onto ascending step but pt is able to perform. Pt confident in asc stairs at home this way as he reports his stairs are smaller height. Pt returned to room and educated on additional session in afternoon with son present prior to D/c to educate son on assist pt may need. Pt in agreement. PT updating TOC on plan and able to order [facility] PT and crutches for pt in attempts to have additional support for stair training. PT to f/u this afternoon for further training prior to d/c.   Recommendations for follow up therapy are one component of a multi-disciplinary discharge planning process, led by the attending physician.  Recommendations may be updated based on patient status, additional functional criteria and insurance authorization.  Follow Up Recommendations  Home health PT     Assistance Recommended at Discharge    Patient can return home with the following A little help with walking and/or transfers;Assistance with cooking/housework;Assist for transportation;Help with stairs or ramp for entrance   Equipment Recommendations  Crutches    Recommendations for Other Services       Precautions / Restrictions  Precautions Precautions: Other (comment);Fall Restrictions Weight Bearing Restrictions: Yes RLE Weight Bearing: Non weight bearing     Mobility  Bed Mobility               General bed mobility comments: NT. In recliner pre/post session Patient Response: Cooperative  Transfers Overall transfer level: Needs assistance Equipment used: Rolling walker (2 wheels) Transfers: Sit to/from Stand Sit to Stand: Supervision           General transfer comment: safe hand placement    Ambulation/Gait Ambulation/Gait assistance: Supervision Gait Distance (Feet): 216 Feet (108x2) Assistive device: Rolling walker (2 wheels)         General Gait Details: Hop-to pattern. Good understanding of [place] precautions.   Stairs Stairs: Yes Stairs assistance: Min guard Stair Management: One rail Right, One rail Left, Step to pattern, Sideways, Forwards Number of Stairs: 12 (3 bouts of 4 stairs) General stair comments: Multiple trials performed with HHA support on L and R rail use, side ways with R rail with BUE support and L rail asc sideways with LUE support. Weakness in UE's limiting hop to pattern. Pt able to perform but difficult for pt to progress L foot up each step. Pt reports stairs are of lesser height at home. Displayed safest method asc/ with BUE's on L railing.   Wheelchair Mobility    Modified Rankin (Stroke Patients Only)       Balance Overall balance assessment: Needs assistance Sitting-balance support: No upper extremity supported Sitting balance-Leahy Scale: Good     Standing balance support:  During functional activity, Reliant on assistive device for balance Standing balance-Leahy Scale: Fair Standing balance comment: Use of RW to maintain precautions.                            Cognition Arousal/Alertness: Awake/alert Behavior During Therapy: WFL for tasks assessed/performed Overall Cognitive Status: Within Functional Limits for tasks  assessed                                          Exercises Other Exercises Other Exercises: Education on stairs training, safest method to perform, use of fmaily for support for safety. F/u session with son in afternoon prior to d/c.    General Comments        Pertinent Vitals/Pain Pain Assessment Pain Assessment: Faces Faces Pain Scale: Hurts a little bit Pain Location: R foot Pain Intervention(s): Limited activity within patient's tolerance, Monitored during session, Repositioned    Home Living                          Prior Function            PT Goals (current goals can now be found in the care plan section) Acute Rehab PT Goals Patient Stated Goal: to return home PT Goal Formulation: With patient Time For Goal Achievement: 08/29/21 Potential to Achieve Goals: Good Progress towards PT goals: Progressing toward goals    Frequency    Min 2X/week      PT Plan Current plan remains appropriate    Co-evaluation              AM-PAC PT "6 Clicks" Mobility   Outcome Measure  Help needed turning from your back to your side while in a flat bed without using bedrails?: None Help needed moving from lying on your back to sitting on the side of a flat bed without using bedrails?: None Help needed moving to and from a bed to a chair (including a wheelchair)?: A Little Help needed standing up from a chair using your arms (e.g., wheelchair or bedside chair)?: A Little Help needed to walk in hospital room?: A Little Help needed climbing 3-5 steps with a railing? : A Lot 6 Click Score: 19    End of Session Equipment Utilized During Treatment: Gait belt Activity Tolerance: Patient tolerated treatment well Patient left: in chair;with call bell/phone within reach Nurse Communication: Mobility status PT Visit Diagnosis: Unsteadiness on feet (R26.81);Muscle weakness (generalized) (M62.81)     Time: 1007-1030 PT Time Calculation (min)  (ACUTE ONLY): 23 min  Charges:  $Gait Training: 23-37 mins                     Salem Caster. Fairly IV, PT, DPT Physical Therapist- [facility]  08/16/2021, 1:03 PM

## 2021-08-16 NOTE — Progress Notes (Signed)
Attempted to change R foot dressing per podiatry orders, met resistance and much difficulty when attempting to remove packing. Both Dr Billie Ruddy and Dr Posey Pronto notified. Discharge canceled for today.

## 2021-08-16 NOTE — Progress Notes (Signed)
PROGRESS NOTE    Jesus Flowers  [id number] DOB: February 13, 1966 DOA: 08/12/2021 PCP: [facility]  133A/133A-AA   Assessment & Plan:   Principal Problem:   Hyperosmolar non-ketotic state in patient with type 2 diabetes mellitus (HCC) Active Problems:   Osteomyelitis of right foot (HCC)   Depression   HTN (hypertension)   Hyperglycemia due to type 2 diabetes mellitus (HCC)   Hyponatremia with increased serum osmolality   Osteomyelitis (HCC)   Jesus Flowers is a 56 y.o. male with medical history significant of hypertension, hyperlipidemia, diabetes mellitus, depression, who presents with right foot wound infection.   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 Allena Katz checked wound today --transition abx to Augmentin today   uncontrolled type II diabetes with severe hyperglycemia and diabetic neuropathy -- A1c 12.3 --cont glargine 15u nightly --mealtime 5u TID --SSI TID   Hypertension --cont lisinopril   hyperlipidemia  --cont statin   Anxiety/depression -- continue fluoxetine   DVT prophylaxis: Lovenox SQ Code Status: Full code  Family Communication:  Level of care: Med-Surg Dispo:   The patient is from: home Anticipated d/c is to: home Anticipated d/c date is: tomorrow Patient currently is medically ready to d/c.   Subjective and Interval History:  RN attempted dressing change today, however, packing very deep and was stuck inside, so podiatry came to help.  Pt had significant pain with and after dressing change.   Objective: Vitals:   08/16/21 0422 08/16/21 0741 08/16/21 1105 08/16/21 1508  BP: 130/83 (!) 144/97 120/62 (!) 153/86  Pulse: 72 77 78 84  Resp: 20 17 17 17   Temp: 98.3 F (36.8 C) 98.4 F (36.9 C) 98.8 F (37.1 C) 98.4 F (36.9 C)  TempSrc:      SpO2: 99% 98% 100% 97%  Weight:      Height:        Intake/Output Summary (Last 24  hours) at 08/16/2021 1609 Last data filed at 08/16/2021 0300 Gross per 24 hour  Intake 980 ml  Output 600 ml  Net 380 ml   Filed Weights   08/12/21 1827 08/13/21 1706  Weight: 68.5 kg 68.5 kg    Examination:   Constitutional: NAD, AAOx3 HEENT: conjunctivae and lids normal, EOMI CV: No cyanosis.   RESP: normal respiratory effort, on RA Extremities: see photo SKIN: warm, dry Neuro: II - XII grossly intact.   Psych: Normal mood and affect.  Appropriate judgement and reason     Data Reviewed: I have personally reviewed following labs and imaging studies  CBC: Recent Labs  Lab 08/12/21 1338 08/12/21 2012 08/13/21 0438 08/15/21 1109 08/16/21 0606  WBC 21.0* 20.1* 16.7* 20.7* 14.4*  NEUTROABS 19.3*  --   --   --   --   HGB 11.9* 10.6* 10.5* 9.6* 9.1*  HCT 34.9* 30.0* 29.7* 27.8* 27.2*  MCV 92.1 90.9 89.7 91.1 91.9  PLT 392 366 370 367 382   Basic Metabolic Panel: Recent Labs  Lab 08/12/21 1338 08/12/21 2012 08/13/21 0438 08/14/21 0411 08/15/21 0538 08/15/21 1109 08/16/21 0606  NA 120* 128* 131*  --   --  129* 132*  K 4.5 4.0 4.0  --   --  4.2 4.8  CL 86* 94* 97*  --   --  99 100  CO2 23 25 25   --   --  23 26  GLUCOSE 709* 255* 144*  --   --  247* 212*  BUN 28* 26* 30*  --   --  26* 29*  CREATININE 1.13 1.32*   1.18 1.10 1.28* 1.25* 1.32* 1.37*  CALCIUM 8.8* 8.6* 8.7*  --   --  8.3* 8.3*  MG  --   --   --   --   --  2.2 2.0   GFR: Estimated Creatinine Clearance: 55 mL/min (A) (by C-G formula based on SCr of 1.37 mg/dL (H)). Liver Function Tests: Recent Labs  Lab 08/12/21 1338  AST 28  ALT 22  ALKPHOS 225*  BILITOT 1.0  PROT 8.1  ALBUMIN 3.2*   No results for input(s): LIPASE, AMYLASE in the last 168 hours. No results for input(s): AMMONIA in the last 168 hours. Coagulation Profile: No results for input(s): INR, PROTIME in the last 168 hours. Cardiac Enzymes: No results for input(s): CKTOTAL, CKMB, CKMBINDEX, TROPONINI in the last 168 hours. BNP  (last 3 results) No results for input(s): PROBNP in the last 8760 hours. HbA1C: No results for input(s): HGBA1C in the last 72 hours. CBG: Recent Labs  Lab 08/15/21 1623 08/15/21 1943 08/16/21 0742 08/16/21 1106 08/16/21 1604  GLUCAP 110* 146* 176* 134* 115*   Lipid Profile: No results for input(s): CHOL, HDL, LDLCALC, TRIG, CHOLHDL, LDLDIRECT in the last 72 hours. Thyroid Function Tests: No results for input(s): TSH, T4TOTAL, FREET4, T3FREE, THYROIDAB in the last 72 hours. Anemia Panel: No results for input(s): VITAMINB12, FOLATE, FERRITIN, TIBC, IRON, RETICCTPCT in the last 72 hours. Sepsis Labs: Recent Labs  Lab 08/12/21 1338 08/12/21 2012  LATICACIDVEN 1.8 2.0*    Recent Results (from the past 240 hour(s))  Blood culture (routine x 2)     Status: None (Preliminary result)   Collection Time: 08/12/21  5:01 PM   Specimen: BLOOD  Result Value Ref Range Status   Specimen Description BLOOD RIGHT AC  Final   Special Requests   Final    BOTTLES DRAWN AEROBIC AND ANAEROBIC Blood Culture results may not be optimal due to an inadequate volume of blood received in culture bottles   Culture   Final    NO GROWTH 4 DAYS Performed at [facility], [address] [place], [place] [address]    Report Status PENDING  Incomplete  Blood culture (routine x 2)     Status: None (Preliminary result)   Collection Time: 08/12/21  5:01 PM   Specimen: BLOOD  Result Value Ref Range Status   Specimen Description BLOOD RIGHT HAND  Final   Special Requests   Final    BOTTLES DRAWN AEROBIC AND ANAEROBIC Blood Culture adequate volume   Culture   Final    NO GROWTH 4 DAYS Performed at [facility], [address], [place], [place] [address]    Report Status PENDING  Incomplete  Resp Panel by RT-PCR (Flu A&B, Covid) Nasopharyngeal Swab     Status: None   Collection Time: 08/12/21 10:43 PM   Specimen: Nasopharyngeal Swab; Nasopharyngeal(NP) swabs in vial transport medium   Result Value Ref Range Status   SARS Coronavirus 2 by RT PCR NEGATIVE NEGATIVE Final    Comment: (NOTE) SARS-CoV-2 target nucleic acids are NOT DETECTED.  The SARS-CoV-2 RNA is generally detectable in upper respiratory specimens during the acute phase of infection. The lowest concentration of SARS-CoV-2 viral copies this assay can detect is 138 copies/mL. A negative result does not preclude SARS-Cov-2 infection and should not be used as the sole basis for treatment or other patient management decisions.  A negative result may occur with  improper specimen collection/handling, submission of specimen other than nasopharyngeal swab, presence of viral mutation(s) within the areas targeted by this assay, and inadequate number of viral copies(<138 copies/mL). A negative result must be combined with clinical observations, patient history, and epidemiological information. The expected result is Negative.  Fact Sheet for Patients:  BloggerCourse.comhttps://www.fda.gov/media/152166/download  Fact Sheet for Healthcare Providers:  SeriousBroker.ithttps://www.fda.gov/media/152162/download  This test is no t yet approved or cleared by the [place] FDA and  has been authorized for detection and/or diagnosis of SARS-CoV-2 by FDA under an Emergency Use Authorization (EUA). This EUA will remain  in effect (meaning this test can be used) for the duration of the COVID-19 declaration under Section 564(b)(1) of the Act, 21 U.S.C.section 360bbb-3(b)(1), unless the authorization is terminated  or revoked sooner.       Influenza A by PCR NEGATIVE NEGATIVE Final   Influenza B by PCR NEGATIVE NEGATIVE Final    Comment: (NOTE) The Xpert Xpress SARS-CoV-2/FLU/RSV plus assay is intended as an aid in the diagnosis of influenza from Nasopharyngeal swab specimens and should not be used as a sole basis for treatment. Nasal washings and aspirates are unacceptable for Xpert Xpress SARS-CoV-2/FLU/RSV testing.  Fact Sheet for  Patients: BloggerCourse.comhttps://www.fda.gov/media/152166/download  Fact Sheet for Healthcare Providers: SeriousBroker.ithttps://www.fda.gov/media/152162/download  This test is not yet approved or cleared by the [place] FDA and has been authorized for detection and/or diagnosis of SARS-CoV-2 by FDA under an Emergency Use Authorization (EUA). This EUA will remain in effect (meaning this test can be used) for the duration of the COVID-19 declaration under Section 564(b)(1) of the Act, 21 U.S.C. section 360bbb-3(b)(1), unless the authorization is terminated or revoked.  Performed at [facility], [address] [place], [place] [address]   Surgical pcr screen     Status: Abnormal   Collection Time: 08/12/21 11:37 PM   Specimen: Nasal Mucosa; Nasal Swab  Result Value Ref Range Status   MRSA, PCR NEGATIVE NEGATIVE Final   Staphylococcus aureus POSITIVE (A) NEGATIVE Final    Comment: (NOTE) The Xpert SA Assay (FDA approved for NASAL specimens in patients 56 years of age and older), is one component of a comprehensive surveillance program. It is not intended to diagnose infection nor to guide or monitor treatment. Performed at [facility], [address] [place], [place] [address]       Radiology Studies: No results found.   Scheduled Meds:  Chlorhexidine Gluconate Cloth  6 each Topical Q0600   enoxaparin (LOVENOX) injection  40 mg Subcutaneous Q24H   FLUoxetine  40 mg Oral Daily   insulin aspart  0-15 Units Subcutaneous TID WC   insulin aspart  5 Units Subcutaneous TID WC   insulin glargine-yfgn  15 Units Subcutaneous QHS   lisinopril  10 mg Oral Daily   mupirocin ointment  1 application Nasal BID   pravastatin  20 mg Oral q1800   sodium chloride flush  3 mL Intravenous Q12H   sodium chloride flush  3 mL Intravenous Q12H   Continuous Infusions:  sodium chloride 250 mL (08/13/21 1251)   piperacillin-tazobactam 3.375 g (08/16/21 0558)   vancomycin 1,250 mg (08/16/21 0923)      LOS: 4 days     Darlin Priestlyina Enisa Runyan, MD [facility] If 7PM-7AM, please contact night-coverage 08/16/2021, 4:09 PM

## 2021-08-17 LAB — CULTURE, BLOOD (ROUTINE X 2)
Culture: NO GROWTH
Culture: NO GROWTH
Special Requests: ADEQUATE

## 2021-08-17 LAB — CBC
HCT: 29.3 % — ABNORMAL LOW (ref 39.0–52.0)
Hemoglobin: 9.7 g/dL — ABNORMAL LOW (ref 13.0–17.0)
MCH: 30.7 pg (ref 26.0–34.0)
MCHC: 33.1 g/dL (ref 30.0–36.0)
MCV: 92.7 fL (ref 80.0–100.0)
Platelets: 439 10*3/uL — ABNORMAL HIGH (ref 150–400)
RBC: 3.16 MIL/uL — ABNORMAL LOW (ref 4.22–5.81)
RDW: 12.1 % (ref 11.5–15.5)
WBC: 16.5 10*3/uL — ABNORMAL HIGH (ref 4.0–10.5)
nRBC: 0 % (ref 0.0–0.2)

## 2021-08-17 LAB — BASIC METABOLIC PANEL
Anion gap: 6 (ref 5–15)
BUN: 24 mg/dL — ABNORMAL HIGH (ref 6–20)
CO2: 24 mmol/L (ref 22–32)
Calcium: 8.4 mg/dL — ABNORMAL LOW (ref 8.9–10.3)
Chloride: 104 mmol/L (ref 98–111)
Creatinine, Ser: 1.13 mg/dL (ref 0.61–1.24)
GFR, Estimated: >60 mL/min (ref >60)
Glucose, Bld: 172 mg/dL — ABNORMAL HIGH (ref 70–99)
Potassium: 4.4 mmol/L (ref 3.5–5.1)
Sodium: 134 mmol/L — ABNORMAL LOW (ref 135–145)

## 2021-08-17 LAB — GLUCOSE, CAPILLARY
Glucose-Capillary: 162 mg/dL — ABNORMAL HIGH (ref 70–99)
Glucose-Capillary: 212 mg/dL — ABNORMAL HIGH (ref 70–99)

## 2021-08-17 LAB — MAGNESIUM: Magnesium: 2 mg/dL (ref 1.7–2.4)

## 2021-08-17 LAB — SURGICAL PATHOLOGY

## 2021-08-17 NOTE — Progress Notes (Signed)
Dressing to R foot changed the evening of 1/19. Wound care instructions reviewed with patient with podiatrist at bedside. [place] set up for assistance with wound care changes. Supplies given to patient if needed for home dressing change. Instruction reinforced, education completed. Discharge instructions given, patient voices understanding, no further questions at this time. Crutches at bedside to go home with patient.

## 2021-08-17 NOTE — TOC Transition Note (Signed)
Transition of Care [facility]) - CM/SW Discharge Note   Patient Details  Name: Jesus Flowers MRN: [id number] Date of Birth: [birth year]  Transition of Care [facility]) CM/SW Contact:  Candie Chroman, LCSW Phone Number: 08/17/2021, 9:27 AM   Clinical Narrative:   Latricia Heft representative is aware of discharge for today. CSW signing off.  Final next level of care: [place] Barriers to Discharge: Barriers Resolved   Patient Goals and CMS Choice     Choice offered to / list presented to : Patient  Discharge Placement                Patient to be transferred to facility by: Son will take him home.   Patient and family notified of of transfer: 08/16/21  Discharge Plan and Services     Post Acute Care Choice: Home Health          DME Arranged: Crutches DME Agency: [employer] Date DME Agency Contacted: 08/16/21   Representative spoke with at DME Agency: Suanne Marker HH Arranged: PT, RN [facility] Agency: [place] Date [place]: 08/16/21   Representative spoke with at [place]: Bobbe Medico  Social Determinants of Health ([place]) Interventions     Readmission Risk Interventions No flowsheet data found.

## 2021-08-17 NOTE — Progress Notes (Signed)
Physical Therapy Treatment Patient Details Name: Jesus Flowers MRN: [id number] DOB: June 28, 1966 Today's Date: 08/17/2021   History of Present Illness Jesus Flowers is a 56 y.o. male with diabetes who comes in with right foot pain x2 months.  Patient reports pain is sharp shooting.  Patient has had all of his toes on his affected foot amputated 2 to 3 months ago.    PT Comments    Pt received in bed with son present. Pt educated on use of single crutch for stair training. Pt remains mod I for bed mobility and transfers and supervision hop to pattern with RW. Pt able to hop to stairs for stair training. Pt and son educated with PT demo on asc/desc stairs with SUE support on railing and SUE on crutch, safe sequencing, and educated son on safe guarding. Pt performed safely with min VC's for form/technique with pt returning to room. Pt educated on RLE NWB exercises to perform for LE strengthening since he is NWB on RLE. PT verbalizing understanding. Education complete with pt or son having no further questions. Pt left with son in room seated EoB with all needs in reach. Safe to d/c home with [facility] PT.     Recommendations for follow up therapy are one component of a multi-disciplinary discharge planning process, led by the attending physician.  Recommendations may be updated based on patient status, additional functional criteria and insurance authorization.  Follow Up Recommendations  Home health PT     Assistance Recommended at Discharge Set up Supervision/Assistance  Patient can return home with the following A little help with walking and/or transfers;Assistance with cooking/housework;Assist for transportation;Help with stairs or ramp for entrance   Equipment Recommendations  Crutches    Recommendations for Other Services       Precautions / Restrictions Precautions Precautions: Other (comment);Fall Restrictions Weight Bearing Restrictions: Yes RLE Weight Bearing: Non weight bearing      Mobility  Bed Mobility Overal bed mobility: Modified Independent               Patient Response: Cooperative  Transfers Overall transfer level: Needs assistance Equipment used: Rolling walker (2 wheels) Transfers: Sit to/from Stand Sit to Stand: Supervision           General transfer comment: safe hand placement    Ambulation/Gait Ambulation/Gait assistance: Supervision Gait Distance (Feet): 500 Feet Assistive device: Rolling walker (2 wheels), Crutches         General Gait Details: Hop-to pattern. Good understanding of [place] precautions.   Stairs Stairs: Yes Stairs assistance: Min guard Stair Management: Step to pattern, Forwards, One rail Right, One rail Left, With crutches Number of Stairs: 6 General stair comments: PT demo with SUE on rail and SUE on crutch. Pt performed with good understanding with son present. Son educated on safe use of DME and guarding.   Wheelchair Mobility    Modified Rankin (Stroke Patients Only)       Balance Overall balance assessment: Needs assistance Sitting-balance support: No upper extremity supported Sitting balance-Leahy Scale: Good     Standing balance support: During functional activity, Reliant on assistive device for balance Standing balance-Leahy Scale: Fair Standing balance comment: Use of RW to maintain precautions.                            Cognition Arousal/Alertness: Awake/alert Behavior During Therapy: WFL for tasks assessed/performed Overall Cognitive Status: Within Functional Limits for tasks assessed  Exercises Other Exercises Other Exercises: educated on NWB RLE exercises for strengthening    General Comments        Pertinent Vitals/Pain Pain Assessment Pain Assessment: No/denies pain    Home Living                          Prior Function            PT Goals (current goals can now be found in the  care plan section) Acute Rehab PT Goals Patient Stated Goal: to return home PT Goal Formulation: With patient Time For Goal Achievement: 08/29/21 Potential to Achieve Goals: Good Progress towards PT goals: Progressing toward goals    Frequency    Min 2X/week      PT Plan Current plan remains appropriate    Co-evaluation              AM-PAC PT "6 Clicks" Mobility   Outcome Measure  Help needed turning from your back to your side while in a flat bed without using bedrails?: None Help needed moving from lying on your back to sitting on the side of a flat bed without using bedrails?: None Help needed moving to and from a bed to a chair (including a wheelchair)?: A Little Help needed standing up from a chair using your arms (e.g., wheelchair or bedside chair)?: A Little Help needed to walk in hospital room?: A Little Help needed climbing 3-5 steps with a railing? : A Little 6 Click Score: 20    End of Session Equipment Utilized During Treatment: Gait belt Activity Tolerance: Patient tolerated treatment well Patient left: in bed;with family/visitor present Nurse Communication: Mobility status PT Visit Diagnosis: Unsteadiness on feet (R26.81);Muscle weakness (generalized) (M62.81)     Time: [id number] PT Time Calculation (min) (ACUTE ONLY): 35 min  Charges:  $Gait Training: 23-37 mins                     Salem Caster. Fairly IV, PT, DPT Physical Therapist- [facility]  08/17/2021, 12:32 PM

## 2021-08-17 NOTE — Progress Notes (Signed)
°   08/17/21 1100  Clinical Encounter Type  Visited With Patient  Visit Type Follow-up   Chaplain wanted to provide follow up support before the patient checked out of hospital today.

## 2021-08-17 NOTE — Discharge Summary (Signed)
Physician Discharge Summary   Jesus Flowers  male DOB: [birth year]  [id number]  PCP: [facility] date: 08/12/2021 Discharge date: 08/17/2021  Admitted From: home Disposition:  home Home Health: Yes CODE STATUS: Full code  Discharge Instructions     Discharge instructions   Complete by: As directed    No weight bearing on your right foot.  Please finish 2 weeks of antibiotic Augmentin at home.  Please follow up with podiatry Dr. Posey Pronto 1 week after discharge.  I have increased your insulin Lantus to 15 units daily, since your blood sugars were not well controlled.   Dr. Enzo Bi - -   Discharge wound care:   Complete by: As directed    change every 3 days (or twice a week) with gauze packed into dorsal portion of wound, kerlix, ACE wrap.  We have also ordered home health nurse to go help you change dressing. - -   Discharge wound care:   Complete by: As directed         Hospital Course:  For full details, please see H&P, progress notes, consult notes and ancillary notes.  Briefly,  Jesus Flowers is a 56 y.o. male with medical history significant of hypertension, diabetes mellitus, depression, who presented with right foot wound infection.  Patient had transmetatarsal amputation on 05/09/2021 while inpatient. Never had outpatient follow-up because patient had no insurance and concerned for cost. He stated that he rested the foot for about 3-4 days after surgery and then went back to work. He did apparently have some follow up visits at the free clinic. Patient stated that 3-4 days PTA he began having increased pain and tenderness with drainage to the right foot amputation stump. He came to the hospital for further treatment and evaluation.    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  --podiatry Dr. Posey Pronto checked wound prior to discharge.   --transition abx to Augmentin at discharge, to be taken for 2 more weeks. --non-weight bearing on right foot. --the surgical wound was very deep, and was difficult to pack, so [facility] RN ordered to help with wound packing and dressing change twice a week. --outpatient followup with podiatry Dr. Posey Pronto.  uncontrolled type II diabetes with severe hyperglycemia and diabetic neuropathy -- A1c 12.3 --cont glargine 15u nightly --resume Janumet after discharge.   Hypertension --cont lisinopril   hyperlipidemia  --cont statin   Anxiety/depression -- continue fluoxetine   Discharge Diagnoses:  Principal Problem:   Hyperosmolar non-ketotic state in patient with type 2 diabetes mellitus ([place]) Active Problems:   Osteomyelitis of right foot ([place])   Depression   HTN (hypertension)   Hyperglycemia due to type 2 diabetes mellitus ([place])   Hyponatremia with increased serum osmolality   Osteomyelitis ([place])   30 Day Unplanned Readmission Risk Score    Flowsheet Row ED to Hosp-Admission (Current) from 08/12/2021 in [place] (1A)  30 Day Unplanned Readmission Risk Score (%) 18.24 Filed at 08/17/2021 0801       This score is the patient's risk of an unplanned readmission within 30 days of being discharged (0 -100%). The score is based on dignosis, age, lab data, medications, orders, and past utilization.   Low:  0-14.9   Medium: 15-21.9   High: 22-29.9   Extreme: 30 and above  Discharge Instructions:  Allergies as of 08/17/2021   No Known Allergies      Medication List     STOP taking these medications    doxycycline 100 MG tablet Commonly known as: VIBRA-TABS       TAKE these medications    amoxicillin-clavulanate 875-125 MG tablet Commonly known as: Augmentin Take 1 tablet by mouth 2 (two) times daily for 14 days.   FLUoxetine 40 MG capsule Commonly known  as: PROZAC Take 40 mg by mouth daily as needed.   insulin glargine 100 UNIT/ML injection Commonly known as: LANTUS Inject 0.15 mLs (15 Units total) into the skin daily. What changed: how much to take   Jardiance 25 MG Tabs tablet Generic drug: empagliflozin Take 25 mg by mouth daily.   lisinopril 10 MG tablet Commonly known as: ZESTRIL Take 10 mg by mouth daily.   lovastatin 20 MG tablet Commonly known as: MEVACOR Take 20 mg by mouth daily.   multivitamin with minerals Tabs tablet Take 1 tablet by mouth daily.   oxyCODONE-acetaminophen 5-325 MG tablet Commonly known as: PERCOCET/ROXICET Take 1-2 tablets by mouth every 6 (six) hours as needed for up to 5 days for moderate pain.   sitaGLIPtin-metformin 50-1000 MG tablet Commonly known as: JANUMET Take 1 tablet by mouth 2 (two) times daily with a meal.               Durable Medical Equipment  (From admission, onward)           Start     Ordered   08/16/21 1105  For home use only DME Crutches  Once        08/16/21 1104              Discharge Care Instructions  (From admission, onward)           Start     Ordered   08/17/21 0000  Discharge wound care:       Comments: change every 3 days (or twice a week) with gauze packed into dorsal portion of wound, kerlix, ACE wrap.  We have also ordered home health nurse to go help you change dressing. - -   08/17/21 0853   08/17/21 0000  Discharge wound care:        08/17/21 [facility] Follow up in 1 week(s).   Contact information: [place] [address] [phone]         Felipa Furnace, DPM Follow up on 08/28/2021.   Specialty: Podiatry Why: wound check. Contact information: [place] [address] [phone]         Margaretha Sheffield, MD Follow up on 08/30/2021.   Specialty: Family Medicine Why: at [facility] information: [place] [address] [phone]         Edrick Kins, DPM Follow up on 08/28/2021.   Specialty: Podiatry Why: at [address] Contact information: [place] [address] [place] Follow up.   Why: Harn un seguimiento con usted para sus necesidades de salud en el hogar: Terap[place] fsica y enfermera. Inicio de atencin 23/1 o 24/1.                No Known Allergies   The results of significant diagnostics from this hospitalization (including imaging, microbiology,  ancillary and laboratory) are listed below for reference.   Consultations:   Procedures/Studies: MR ANKLE RIGHT WO CONTRAST  Result Date: 08/12/2021 CLINICAL DATA:  Diabetic foot pain. History of transmetatarsal amputation of the right foot on 05/09/2021 EXAM: MRI OF THE RIGHT ANKLE WITHOUT CONTRAST TECHNIQUE: Multiplanar, multisequence MR imaging of the ankle was performed. No intravenous contrast was administered. COMPARISON:  X-ray 08/12/2021 FINDINGS: TENDONS Peroneal: Peroneus longus and brevis tendons are intact and normally positioned. Posteromedial: Tibialis posterior, flexor hallucis longus, and flexor digitorum longus tendons are intact and normally positioned. Mild tenosynovitis of the posteromedial ankle tendons. Anterior: Tibialis anterior, extensor hallucis longus, and extensor digitorum longus tendons are intact and normally positioned. Achilles: Intact. Plantar Fascia: Intact. LIGAMENTS Lateral: Intact tibiofibular ligaments. The anterior and posterior talofibular ligaments are intact. Intact calcaneofibular ligament. Medial: The deltoid and visualized portions of the spring ligament appear intact. CARTILAGE AND BONES Ankle Joint: No significant ankle joint effusion. The talar dome and tibial plafond are intact. Subtalar Joints/Sinus Tarsi: No cartilage defect. Small posterior subtalar joint effusion. Preservation of the anatomic fat within the sinus  tarsi. Bones: Postsurgical changes of transmetatarsal amputation of the first through fifth digits. There is prominent bone marrow edema within the residual second and third metatarsal bases with confluent low T1 signal changes along the resection margins compatible with osteomyelitis. Similar, slightly less pronounced findings are also present at the bases of the fourth and fifth metatarsals. Patchy bone marrow edema within the medial cuneiform without associated T1 marrow signal abnormality. Additional patchy sites of marrow edema within the midfoot and hindfoot, nonspecific. No acute fracture or dislocation. Other: Diffuse edema-like signal throughout the visualized musculature, may reflect myositis or denervation. Extensive soft tissue swelling and edema. No organized fluid collection. IMPRESSION: 1. Postsurgical changes of transmetatarsal amputation of the right foot. Findings of acute osteomyelitis involving the residual bases of the second and third metatarsals. 2. Probable early acute osteomyelitis involving the bases of the fourth and fifth metatarsals. 3. Bone marrow edema within the medial cuneiform may be reactive. 4. Extensive soft tissue swelling and edema. No organized fluid collection. 5. Diffuse edema-like signal throughout the visualized musculature, may reflect myositis or denervation. Electronically Signed   By: Davina Poke D.O.   On: 08/12/2021 20:08   DG Foot Complete Right  Result Date: 08/14/2021 CLINICAL DATA:  Postop amputation. EXAM: RIGHT FOOT COMPLETE - 3+ VIEW COMPARISON:  08/12/2021 FINDINGS: Interval amputation of the RIGHT foot, sparing the calcaneus and talus. Soft tissue edema and irregularity, with sutures noted. IMPRESSION: Postoperative appearance of the RIGHT foot following amputation. Electronically Signed   By: Nolon Nations M.D.   On: 08/14/2021 14:31   DG Foot Complete Right  Result Date: 08/12/2021 CLINICAL DATA:  Open wound in the skin, pain EXAM: RIGHT  FOOT COMPLETE - 3+ VIEW COMPARISON:  05/03/2021 FINDINGS: There is interval transmetatarsal amputation of right foot. No recent fracture or dislocation is seen. There are no focal lytic lesions. Arterial calcifications are seen in the soft tissues. In the lateral view, there is linear area of low attenuation in the subcutaneous plane in the plantar aspect of the foot. Bony spurs seen in the talonavicular joint. Small smooth marginated calcifications at the attachment of Achilles tendon to the calcaneus may suggest calcific bursitis or tendinosis. IMPRESSION: Interval transmetatarsal amputation of right foot. No recent fracture or dislocation is seen. There is no focal lytic lesion. There is linear area of low attenuation in the soft tissues along the plantar aspect of  right foot. Possibility of infectious process in the soft tissues is not excluded. Other findings as described in the body of the report. Electronically Signed   By: Elmer Picker M.D.   On: 08/12/2021 14:30      Labs: BNP (last 3 results) No results for input(s): BNP in the last 8760 hours. Basic Metabolic Panel: Recent Labs  Lab 08/12/21 2012 08/13/21 [id number] 08/14/21 0411 08/15/21 0538 08/15/21 1109 08/16/21 0606 08/17/21 0417  NA 128* 131*  --   --  129* 132* 134*  K 4.0 4.0  --   --  4.2 4.8 4.4  CL 94* 97*  --   --  99 100 104  CO2 25 25  --   --  23 26 24   GLUCOSE 255* 144*  --   --  247* 212* 172*  BUN 26* 30*  --   --  26* 29* 24*  CREATININE 1.32*   1.18 1.10 1.28* 1.25* 1.32* 1.37* 1.13  CALCIUM 8.6* 8.7*  --   --  8.3* 8.3* 8.4*  MG  --   --   --   --  2.2 2.0 2.0   Liver Function Tests: Recent Labs  Lab 08/12/21 1338  AST 28  ALT 22  ALKPHOS 225*  BILITOT 1.0  PROT 8.1  ALBUMIN 3.2*   No results for input(s): LIPASE, AMYLASE in the last 168 hours. No results for input(s): AMMONIA in the last 168 hours. CBC: Recent Labs  Lab 08/12/21 1338 08/12/21 2012 08/13/21 0438 08/15/21 1109  08/16/21 0606 08/17/21 0417  WBC 21.0* 20.1* 16.7* 20.7* 14.4* 16.5*  NEUTROABS 19.3*  --   --   --   --   --   HGB 11.9* 10.6* 10.5* 9.6* 9.1* 9.7*  HCT 34.9* 30.0* 29.7* 27.8* 27.2* 29.3*  MCV 92.1 90.9 89.7 91.1 91.9 92.7  PLT 392 366 370 367 382 439*   Cardiac Enzymes: No results for input(s): CKTOTAL, CKMB, CKMBINDEX, TROPONINI in the last 168 hours. BNP: Invalid input(s): POCBNP CBG: Recent Labs  Lab 08/16/21 0742 08/16/21 1106 08/16/21 1604 08/16/21 2154 08/17/21 0743  GLUCAP 176* 134* 115* 126* 162*   D-Dimer No results for input(s): DDIMER in the last 72 hours. Hgb A1c No results for input(s): HGBA1C in the last 72 hours. Lipid Profile No results for input(s): CHOL, HDL, LDLCALC, TRIG, CHOLHDL, LDLDIRECT in the last 72 hours. Thyroid function studies No results for input(s): TSH, T4TOTAL, T3FREE, THYROIDAB in the last 72 hours.  Invalid input(s): FREET3 Anemia work up No results for input(s): VITAMINB12, FOLATE, FERRITIN, TIBC, IRON, RETICCTPCT in the last 72 hours. Urinalysis    Component Value Date/Time   COLORURINE STRAW (A) 05/05/2021 2215   APPEARANCEUR CLEAR (A) 05/05/2021 2215   LABSPEC 1.025 05/05/2021 2215   PHURINE 5.0 05/05/2021 2215   GLUCOSEU >=500 (A) 05/05/2021 2215   HGBUR SMALL (A) 05/05/2021 2215   BILIRUBINUR NEGATIVE 05/05/2021 2215   KETONESUR 5 (A) 05/05/2021 2215   PROTEINUR 100 (A) 05/05/2021 2215   NITRITE NEGATIVE 05/05/2021 2215   LEUKOCYTESUR NEGATIVE 05/05/2021 2215   Sepsis Labs Invalid input(s): PROCALCITONIN,  WBC,  LACTICIDVEN Microbiology Recent Results (from the past 240 hour(s))  Blood culture (routine x 2)     Status: None   Collection Time: 08/12/21  5:01 PM   Specimen: BLOOD  Result Value Ref Range Status   Specimen Description BLOOD RIGHT [facility]  Final   Special Requests   Final    BOTTLES DRAWN AEROBIC AND  ANAEROBIC Blood Culture results may not be optimal due to an inadequate volume of blood received in  culture bottles   Culture   Final    NO GROWTH 5 DAYS Performed at [facility], [address] [place], [place] [address]    Report Status 08/17/2021 FINAL  Final  Blood culture (routine x 2)     Status: None   Collection Time: 08/12/21  5:01 PM   Specimen: BLOOD  Result Value Ref Range Status   Specimen Description BLOOD RIGHT HAND  Final   Special Requests   Final    BOTTLES DRAWN AEROBIC AND ANAEROBIC Blood Culture adequate volume   Culture   Final    NO GROWTH 5 DAYS Performed at [facility], [address] [place], [place] [address]    Report Status 08/17/2021 FINAL  Final  Resp Panel by RT-PCR (Flu A&B, Covid) Nasopharyngeal Swab     Status: None   Collection Time: 08/12/21 10:43 PM   Specimen: Nasopharyngeal Swab; Nasopharyngeal(NP) swabs in vial transport medium  Result Value Ref Range Status   SARS Coronavirus 2 by RT PCR NEGATIVE NEGATIVE Final    Comment: (NOTE) SARS-CoV-2 target nucleic acids are NOT DETECTED.  The SARS-CoV-2 RNA is generally detectable in upper respiratory specimens during the acute phase of infection. The lowest concentration of SARS-CoV-2 viral copies this assay can detect is 138 copies/mL. A negative result does not preclude SARS-Cov-2 infection and should not be used as the sole basis for treatment or other patient management decisions. A negative result may occur with  improper specimen collection/handling, submission of specimen other than nasopharyngeal swab, presence of viral mutation(s) within the areas targeted by this assay, and inadequate number of viral copies(<138 copies/mL). A negative result must be combined with clinical observations, patient history, and epidemiological information. The expected result is Negative.  Fact Sheet for Patients:  EntrepreneurPulse.com.au  Fact Sheet for Healthcare Providers:  IncredibleEmployment.be  This test is no t yet approved or  cleared by the [place] FDA and  has been authorized for detection and/or diagnosis of SARS-CoV-2 by FDA under an Emergency Use Authorization (EUA). This EUA will remain  in effect (meaning this test can be used) for the duration of the COVID-19 declaration under Section 564(b)(1) of the Act, 21 U.S.C.section 360bbb-3(b)(1), unless the authorization is terminated  or revoked sooner.       Influenza A by PCR NEGATIVE NEGATIVE Final   Influenza B by PCR NEGATIVE NEGATIVE Final    Comment: (NOTE) The Xpert Xpress SARS-CoV-2/FLU/RSV plus assay is intended as an aid in the diagnosis of influenza from Nasopharyngeal swab specimens and should not be used as a sole basis for treatment. Nasal washings and aspirates are unacceptable for Xpert Xpress SARS-CoV-2/FLU/RSV testing.  Fact Sheet for Patients: EntrepreneurPulse.com.au  Fact Sheet for Healthcare Providers: IncredibleEmployment.be  This test is not yet approved or cleared by the [place] FDA and has been authorized for detection and/or diagnosis of SARS-CoV-2 by FDA under an Emergency Use Authorization (EUA). This EUA will remain in effect (meaning this test can be used) for the duration of the COVID-19 declaration under Section 564(b)(1) of the Act, 21 U.S.C. section 360bbb-3(b)(1), unless the authorization is terminated or revoked.  Performed at [facility], [address], [place], [place] [address]   Surgical pcr screen     Status: Abnormal   Collection Time: 08/12/21 11:37 PM   Specimen: Nasal Mucosa; Nasal Swab  Result Value Ref Range Status  MRSA, PCR NEGATIVE NEGATIVE Final   Staphylococcus aureus POSITIVE (A) NEGATIVE Final    Comment: (NOTE) The Xpert SA Assay (FDA approved for NASAL specimens in patients 41 years of age and older), is one component of a comprehensive surveillance program. It is not intended to diagnose infection nor to guide or monitor  treatment. Performed at [facility], [address] [place], [place] [address]      Total time spend on discharging this patient, including the last patient exam, discussing the hospital stay, instructions for ongoing care as it relates to all pertinent caregivers, as well as preparing the medical discharge records, prescriptions, and/or referrals as applicable, is 30 minutes.    Enzo Bi, MD  [facility] 08/17/2021, 8:56 AM

## 2021-08-19 NOTE — Anesthesia Postprocedure Evaluation (Signed)
Anesthesia Post Note  Patient: Jesus Flowers  Procedure(s) Performed: AMPUTATION FOOT (Right: Foot) INCISION AND DRAINAGE (Right: Foot)  Patient location during evaluation: PACU Anesthesia Type: General Level of consciousness: awake and alert Pain management: pain level controlled Vital Signs Assessment: post-procedure vital signs reviewed and stable Respiratory status: spontaneous breathing, nonlabored ventilation, respiratory function stable and patient connected to nasal cannula oxygen Cardiovascular status: blood pressure returned to baseline and stable Postop Assessment: no apparent nausea or vomiting Anesthetic complications: no   No notable events documented.   Last Vitals:  Vitals:   08/17/21 0742 08/17/21 1116  BP: (!) 143/78 (!) 152/89  Pulse: 74 77  Resp: 18 18  Temp: 36.7 C 36.6 C  SpO2: 97% 98%    Last Pain:  Vitals:   08/16/21 2315  TempSrc:   PainSc: 2                  Martha Clan

## 2021-08-28 ENCOUNTER — Other Ambulatory Visit: Payer: Self-pay

## 2021-08-28 ENCOUNTER — Ambulatory Visit: Payer: Self-pay | Admitting: Podiatry

## 2021-08-28 ENCOUNTER — Encounter: Payer: Self-pay | Admitting: Podiatry

## 2021-08-28 ENCOUNTER — Ambulatory Visit (INDEPENDENT_AMBULATORY_CARE_PROVIDER_SITE_OTHER): Payer: Self-pay | Admitting: Podiatry

## 2021-08-28 DIAGNOSIS — Z89431 Acquired absence of right foot: Secondary | ICD-10-CM

## 2021-08-29 NOTE — Progress Notes (Signed)
°  Subjective:  Patient ID: Jesus Flowers, male    DOB: [birth year],  MRN: [id number]  Chief Complaint  Patient presents with   Routine Post Op     Post op-amputation R foot    DOS: 08/14/2021 Procedure: Incision and drainage washout debridement with revision of the right foot foot amputation  56 y.o. male returns for post-op check.  Patient states he is doing okay.  He has nursing coming out twice a day to do dressing changes.  He is here to get it evaluated.  He had original surgery done by Dr. Sherryle Lis and then followed by Dr. Amalia Hailey.  No nausea fever chills vomiting.  No purulent drainage noted.  Review of Systems: Negative except as noted in the HPI. Denies N/V/F/Ch.  Past Medical History:  Diagnosis Date   Depression    Diabetes mellitus without complication (HCC)    High cholesterol     Current Outpatient Medications:    amoxicillin-clavulanate (AUGMENTIN) 875-125 MG tablet, Take 1 tablet by mouth 2 (two) times daily for 14 days., Disp: 28 tablet, Rfl: 0   FLUoxetine (PROZAC) 40 MG capsule, Take 40 mg by mouth daily as needed., Disp: , Rfl:    insulin glargine (LANTUS) 100 UNIT/ML injection, Inject 0.15 mLs (15 Units total) into the skin daily., Disp: 4.5 mL, Rfl: 2   JARDIANCE 25 MG TABS tablet, Take 25 mg by mouth daily., Disp: , Rfl:    lisinopril (ZESTRIL) 10 MG tablet, Take 10 mg by mouth daily., Disp: , Rfl:    lovastatin (MEVACOR) 20 MG tablet, Take 20 mg by mouth daily., Disp: , Rfl:    Multiple Vitamin (MULTIVITAMIN WITH MINERALS) TABS tablet, Take 1 tablet by mouth daily., Disp: , Rfl:    sitaGLIPtin-metformin (JANUMET) 50-1000 MG tablet, Take 1 tablet by mouth 2 (two) times daily with a meal., Disp: , Rfl:   Social History   Tobacco Use  Smoking Status Never  Smokeless Tobacco Never    No Known Allergies Objective:  There were no vitals filed for this visit. There is no height or weight on file to calculate BMI. Constitutional Well developed. Well nourished.   Vascular Foot warm and well perfused. Capillary refill normal to all digits.   Neurologic Normal speech. Oriented to person, place, and time. Epicritic sensation to light touch grossly present bilaterally.  Dermatologic Status post Chopart amputation with central wound defect.  No purulent drainage noted.  No redness noted.  Serosanguineous fluid was drained.  No clinical signs of infection noted.  Orthopedic: Tenderness to palpation noted about the surgical site.   Radiographs: None Assessment:  No diagnosis found. Plan:  Patient was evaluated and treated and all questions answered.  S/p foot surgery right -Progressing as expected post-operatively. -XR: See above -WB Status: Nonweightbearing in left lower extremity and a walker -Sutures: Intact.  No clinical signs of [place] is noted.  No complication noted.  We may think about removing during next clinical visit -Medications: None -Foot redressed.  No follow-ups on file.

## 2021-09-17 ENCOUNTER — Telehealth: Payer: Self-pay | Admitting: *Deleted

## 2021-09-17 NOTE — Telephone Encounter (Signed)
Thanks for letting me know!

## 2021-09-17 NOTE — Telephone Encounter (Signed)
Jesus Flowers called and canceled his appointment for 09/19/2021.  He stated he was in [place] for some personal business.  He requested his appointment be rescheduled to the end of March.  His appointment was rescheduled to 10/24/2021 with Dr. Sherryle Lis.

## 2021-09-19 ENCOUNTER — Ambulatory Visit: Payer: Self-pay | Admitting: Podiatry

## 2021-10-24 ENCOUNTER — Ambulatory Visit: Payer: Self-pay | Admitting: Podiatry

## 2021-10-29 ENCOUNTER — Encounter: Payer: Self-pay | Admitting: Podiatry

## 2021-10-29 ENCOUNTER — Ambulatory Visit (INDEPENDENT_AMBULATORY_CARE_PROVIDER_SITE_OTHER): Payer: Self-pay | Admitting: Podiatry

## 2021-10-29 ENCOUNTER — Other Ambulatory Visit: Payer: Self-pay | Admitting: Podiatry

## 2021-10-29 DIAGNOSIS — E1152 Type 2 diabetes mellitus with diabetic peripheral angiopathy with gangrene: Secondary | ICD-10-CM

## 2021-10-29 DIAGNOSIS — Z89431 Acquired absence of right foot: Secondary | ICD-10-CM

## 2021-10-29 DIAGNOSIS — L089 Local infection of the skin and subcutaneous tissue, unspecified: Secondary | ICD-10-CM

## 2021-10-29 DIAGNOSIS — L97509 Non-pressure chronic ulcer of other part of unspecified foot with unspecified severity: Secondary | ICD-10-CM

## 2021-10-29 DIAGNOSIS — E11628 Type 2 diabetes mellitus with other skin complications: Secondary | ICD-10-CM

## 2021-10-29 DIAGNOSIS — E11621 Type 2 diabetes mellitus with foot ulcer: Secondary | ICD-10-CM

## 2021-10-29 NOTE — Progress Notes (Signed)
?  Subjective:  ?Patient ID: Jesus Flowers, male    DOB: July 17, 1966,  MRN: [id number] ? ?Chief Complaint  ?Patient presents with  ? Foot Ulcer  ? ? ?DOS: 08/14/21 ?Procedure: Chopart's  amputation of right foot ? ?56 y.o. male returns for post-op check. Has been doing worse, swelling, draining, red and painful. He had to reschedule his last few appointments ? ?Review of Systems: Negative except as noted in the HPI. Denies N/V/F/Ch. ? ? ?Objective:  ?There were no vitals filed for this visit. ?There is no height or weight on file to calculate BMI. ?Constitutional Well developed. ?Well nourished.  ?Vascular Foot warm and well perfused. ?Capillary refill normal to all digits.  Calf is soft and supple, no posterior calf or knee pain, negative Homans' sign  ?Neurologic Normal speech. ?Oriented to person, place, and time. ?Epicritic sensation to light touch grossly abssent bilaterally.  ?Dermatologic Grossly necrotic amputation site  with large deep ulcer probes to bone, purulent drainage, and cellulitis to ankle   ?Orthopedic: Tenderness to palpation noted about the surgical site.  ? ?Assessment:  ? ?1. S/P chopart's amputation, right (HCC)   ?2. 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 Diabetic foot infection (HCC)   ? ?Plan:  ?Patient was evaluated and treated and all questions answered. ? ?Unfortunately has severe dehiscence and necrosis of wound. I suspect osteomyelitis of the remaining talus and calcaneus, septic arthritis of  subtalar and probably ankle  joint too. I discussed with him I do not think his foot is salvageable at this point, and no further amputation in the foot would leave him with a functional limb. He would benefit  from below the knee amputation and a prosthetic. I have recommended he go to the emergency room for admission for this, he says he will go in the morning first thing. We discussed the risk of waiting longer and possible  spread,  bacteremia, sepsis and  risk of death. He  understands and will go tomorrow. I did take a culture of the  purulence today to help guide antibiotic therapy. ? ?Return as needed for diabetic foot care on left foot.  ?

## 2021-10-30 ENCOUNTER — Emergency Department: Payer: Self-pay

## 2021-10-30 ENCOUNTER — Inpatient Hospital Stay
Admission: EM | Admit: 2021-10-30 | Discharge: 2021-11-07 | DRG: 475 | Disposition: A | Discharge status: Home Health | Payer: Self-pay | Attending: Internal Medicine | Admitting: Internal Medicine

## 2021-10-30 ENCOUNTER — Encounter: Payer: Self-pay | Admitting: Emergency Medicine

## 2021-10-30 ENCOUNTER — Inpatient Hospital Stay: Payer: Self-pay

## 2021-10-30 ENCOUNTER — Other Ambulatory Visit: Payer: Self-pay

## 2021-10-30 DIAGNOSIS — E11628 Type 2 diabetes mellitus with other skin complications: Secondary | ICD-10-CM | POA: Diagnosis present

## 2021-10-30 DIAGNOSIS — I10 Essential (primary) hypertension: Secondary | ICD-10-CM | POA: Diagnosis present

## 2021-10-30 DIAGNOSIS — R61 Generalized hyperhidrosis: Secondary | ICD-10-CM | POA: Diagnosis present

## 2021-10-30 DIAGNOSIS — L97519 Non-pressure chronic ulcer of other part of right foot with unspecified severity: Secondary | ICD-10-CM | POA: Diagnosis present

## 2021-10-30 DIAGNOSIS — E871 Hypo-osmolality and hyponatremia: Secondary | ICD-10-CM | POA: Diagnosis present

## 2021-10-30 DIAGNOSIS — L089 Local infection of the skin and subcutaneous tissue, unspecified: Secondary | ICD-10-CM | POA: Diagnosis present

## 2021-10-30 DIAGNOSIS — Z597 Insufficient social insurance and welfare support: Secondary | ICD-10-CM

## 2021-10-30 DIAGNOSIS — Z7984 Long term (current) use of oral hypoglycemic drugs: Secondary | ICD-10-CM

## 2021-10-30 DIAGNOSIS — E1152 Type 2 diabetes mellitus with diabetic peripheral angiopathy with gangrene: Secondary | ICD-10-CM | POA: Diagnosis present

## 2021-10-30 DIAGNOSIS — E1165 Type 2 diabetes mellitus with hyperglycemia: Secondary | ICD-10-CM | POA: Diagnosis present

## 2021-10-30 DIAGNOSIS — T8743 Infection of amputation stump, right lower extremity: Principal | ICD-10-CM | POA: Diagnosis present

## 2021-10-30 DIAGNOSIS — D509 Iron deficiency anemia, unspecified: Secondary | ICD-10-CM | POA: Diagnosis present

## 2021-10-30 DIAGNOSIS — N179 Acute kidney failure, unspecified: Secondary | ICD-10-CM | POA: Diagnosis present

## 2021-10-30 DIAGNOSIS — Z794 Long term (current) use of insulin: Secondary | ICD-10-CM

## 2021-10-30 DIAGNOSIS — L02611 Cutaneous abscess of right foot: Secondary | ICD-10-CM | POA: Diagnosis present

## 2021-10-30 DIAGNOSIS — T8753 Necrosis of amputation stump, right lower extremity: Secondary | ICD-10-CM | POA: Diagnosis present

## 2021-10-30 DIAGNOSIS — F32A Depression, unspecified: Secondary | ICD-10-CM | POA: Diagnosis present

## 2021-10-30 DIAGNOSIS — T8781 Dehiscence of amputation stump: Secondary | ICD-10-CM | POA: Diagnosis present

## 2021-10-30 DIAGNOSIS — E1169 Type 2 diabetes mellitus with other specified complication: Secondary | ICD-10-CM | POA: Diagnosis present

## 2021-10-30 DIAGNOSIS — E11621 Type 2 diabetes mellitus with foot ulcer: Secondary | ICD-10-CM | POA: Diagnosis present

## 2021-10-30 DIAGNOSIS — E7800 Pure hypercholesterolemia, unspecified: Secondary | ICD-10-CM | POA: Diagnosis present

## 2021-10-30 DIAGNOSIS — Z79899 Other long term (current) drug therapy: Secondary | ICD-10-CM

## 2021-10-30 DIAGNOSIS — M86171 Other acute osteomyelitis, right ankle and foot: Secondary | ICD-10-CM | POA: Diagnosis present

## 2021-10-30 DIAGNOSIS — Y835 Amputation of limb(s) as the cause of abnormal reaction of the patient, or of later complication, without mention of misadventure at the time of the procedure: Secondary | ICD-10-CM | POA: Diagnosis present

## 2021-10-30 DIAGNOSIS — E1142 Type 2 diabetes mellitus with diabetic polyneuropathy: Secondary | ICD-10-CM | POA: Diagnosis present

## 2021-10-30 DIAGNOSIS — L03115 Cellulitis of right lower limb: Secondary | ICD-10-CM | POA: Diagnosis present

## 2021-10-30 DIAGNOSIS — Z833 Family history of diabetes mellitus: Secondary | ICD-10-CM

## 2021-10-30 DIAGNOSIS — M869 Osteomyelitis, unspecified: Secondary | ICD-10-CM

## 2021-10-30 LAB — CBC WITH DIFFERENTIAL/PLATELET
Abs Immature Granulocytes: 0.08 10*3/uL — ABNORMAL HIGH (ref 0.00–0.07)
Basophils Absolute: 0 10*3/uL (ref 0.0–0.1)
Basophils Relative: 0 %
Eosinophils Absolute: 0.3 10*3/uL (ref 0.0–0.5)
Eosinophils Relative: 2 %
HCT: 32.2 % — ABNORMAL LOW (ref 39.0–52.0)
Hemoglobin: 10.8 g/dL — ABNORMAL LOW (ref 13.0–17.0)
Immature Granulocytes: 1 %
Lymphocytes Relative: 7 %
Lymphs Abs: 0.8 10*3/uL (ref 0.7–4.0)
MCH: 30.7 pg (ref 26.0–34.0)
MCHC: 33.5 g/dL (ref 30.0–36.0)
MCV: 91.5 fL (ref 80.0–100.0)
Monocytes Absolute: 0.7 10*3/uL (ref 0.1–1.0)
Monocytes Relative: 6 %
Neutro Abs: 9.9 10*3/uL — ABNORMAL HIGH (ref 1.7–7.7)
Neutrophils Relative %: 84 %
Platelets: 352 10*3/uL (ref 150–400)
RBC: 3.52 MIL/uL — ABNORMAL LOW (ref 4.22–5.81)
RDW: 12 % (ref 11.5–15.5)
WBC: 11.8 10*3/uL — ABNORMAL HIGH (ref 4.0–10.5)
nRBC: 0 % (ref 0.0–0.2)

## 2021-10-30 LAB — LACTIC ACID, PLASMA
Lactic Acid, Venous: 1 mmol/L (ref 0.5–1.9)
Lactic Acid, Venous: 1 mmol/L (ref 0.5–1.9)

## 2021-10-30 LAB — BASIC METABOLIC PANEL
Anion gap: 8 (ref 5–15)
BUN: 35 mg/dL — ABNORMAL HIGH (ref 6–20)
CO2: 25 mmol/L (ref 22–32)
Calcium: 8.5 mg/dL — ABNORMAL LOW (ref 8.9–10.3)
Chloride: 100 mmol/L (ref 98–111)
Creatinine, Ser: 1.22 mg/dL (ref 0.61–1.24)
GFR, Estimated: >60 mL/min (ref >60)
Glucose, Bld: 271 mg/dL — ABNORMAL HIGH (ref 70–99)
Potassium: 4.5 mmol/L (ref 3.5–5.1)
Sodium: 133 mmol/L — ABNORMAL LOW (ref 135–145)

## 2021-10-30 LAB — URINALYSIS, ROUTINE W REFLEX MICROSCOPIC
Bacteria, UA: NONE SEEN
Bilirubin Urine: NEGATIVE
Glucose, UA: >=500 mg/dL — AB
Hgb urine dipstick: NEGATIVE
Ketones, ur: NEGATIVE mg/dL
Leukocytes,Ua: NEGATIVE
Nitrite: NEGATIVE
Protein, ur: 100 mg/dL — AB
Specific Gravity, Urine: 1.02 (ref 1.005–1.030)
pH: 6 (ref 5.0–8.0)

## 2021-10-30 LAB — COMPREHENSIVE METABOLIC PANEL
ALT: 34 U/L (ref 0–44)
AST: 27 U/L (ref 15–41)
Albumin: 2.8 g/dL — ABNORMAL LOW (ref 3.5–5.0)
Alkaline Phosphatase: 289 U/L — ABNORMAL HIGH (ref 38–126)
Anion gap: 9 (ref 5–15)
BUN: 42 mg/dL — ABNORMAL HIGH (ref 6–20)
CO2: 25 mmol/L (ref 22–32)
Calcium: 8.7 mg/dL — ABNORMAL LOW (ref 8.9–10.3)
Chloride: 94 mmol/L — ABNORMAL LOW (ref 98–111)
Creatinine, Ser: 1.57 mg/dL — ABNORMAL HIGH (ref 0.61–1.24)
GFR, Estimated: 51 mL/min — ABNORMAL LOW (ref >60)
Glucose, Bld: 557 mg/dL (ref 70–99)
Potassium: 5.2 mmol/L — ABNORMAL HIGH (ref 3.5–5.1)
Sodium: 128 mmol/L — ABNORMAL LOW (ref 135–145)
Total Bilirubin: 0.5 mg/dL (ref 0.3–1.2)
Total Protein: 7.8 g/dL (ref 6.5–8.1)

## 2021-10-30 LAB — CBC
HCT: 28.9 % — ABNORMAL LOW (ref 39.0–52.0)
Hemoglobin: 9.8 g/dL — ABNORMAL LOW (ref 13.0–17.0)
MCH: 30.9 pg (ref 26.0–34.0)
MCHC: 33.9 g/dL (ref 30.0–36.0)
MCV: 91.2 fL (ref 80.0–100.0)
Platelets: 338 10*3/uL (ref 150–400)
RBC: 3.17 MIL/uL — ABNORMAL LOW (ref 4.22–5.81)
RDW: 11.9 % (ref 11.5–15.5)
WBC: 10.7 10*3/uL — ABNORMAL HIGH (ref 4.0–10.5)
nRBC: 0 % (ref 0.0–0.2)

## 2021-10-30 LAB — CBG MONITORING, ED: Glucose-Capillary: 377 mg/dL — ABNORMAL HIGH (ref 70–99)

## 2021-10-30 LAB — SEDIMENTATION RATE: Sed Rate: 127 mm/hr — ABNORMAL HIGH (ref 0–20)

## 2021-10-30 LAB — GLUCOSE, CAPILLARY: Glucose-Capillary: 80 mg/dL (ref 70–99)

## 2021-10-30 LAB — PHOSPHORUS: Phosphorus: 3 mg/dL (ref 2.5–4.6)

## 2021-10-30 LAB — MAGNESIUM: Magnesium: 2.1 mg/dL (ref 1.7–2.4)

## 2021-10-30 LAB — PREALBUMIN: Prealbumin: 8.7 mg/dL — ABNORMAL LOW (ref 18–38)

## 2021-10-30 LAB — C-REACTIVE PROTEIN: CRP: 20.9 mg/dL — ABNORMAL HIGH (ref <1.0)

## 2021-10-30 LAB — ETHANOL: Alcohol, Ethyl (B): <10 mg/dL (ref <10)

## 2021-10-30 IMAGING — MR MR ANKLE*R* WO/W CM
10 of 11 series · 35 of 40 positions shown · IV contrast (gadavist)
Comparison: None.

CLINICAL DATA: Status post Chopart's amputation present with
progressive right foot infection/cellulitis.

EXAM:
MRI OF THE RIGHT ANKLE WITHOUT AND WITH CONTRAST
TECHNIQUE: Multiplanar, multisequence MR imaging of the ankle was performed
before and after the administration of intravenous contrast.
CONTRAST:  7mL GADAVIST GADOBUTROL 1 MMOL/ML IV SOLN

[Series 3: T1 · coronal · right · 3.0mm · 0.38mm/px · 4 of 45 slices shown (1 of 2)]
[im 1/45]
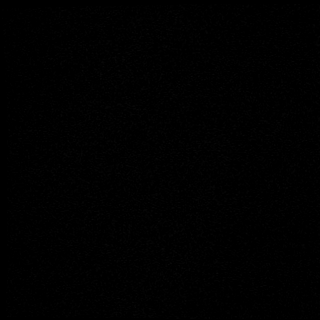
[im 15/45]
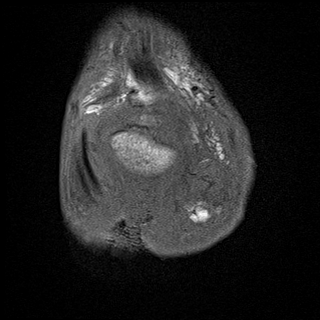
[im 30/45]
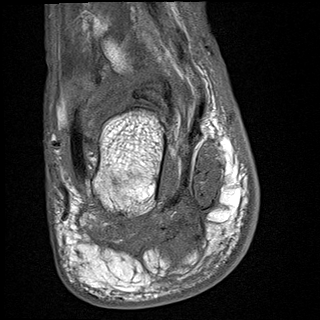
[im 45/45]
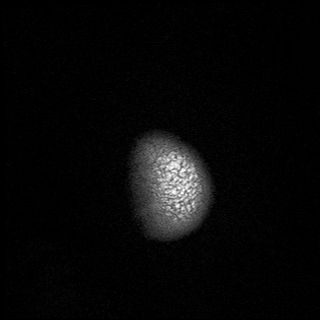

[Series 4: T1 fat-sat · coronal · non-contrast · right · 3.0mm · 0.47mm/px · 4 of 45 slices shown (1 of 2)]
[im 1/45]
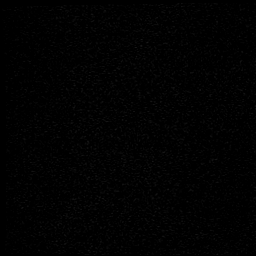
[im 15/45]
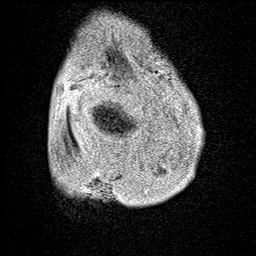
[im 30/45]
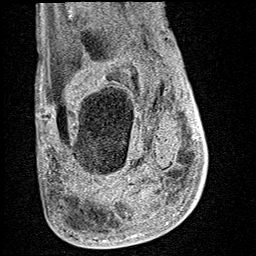
[im 45/45]
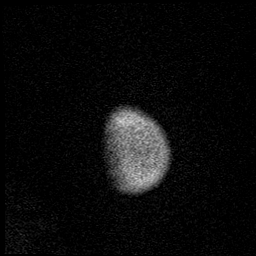

[Series 7: PD fat-sat · axial · right · 3.0mm · 0.50mm/px · z∈[-37,+103]mm · 4 of 36 slices shown]
[im 1/36]
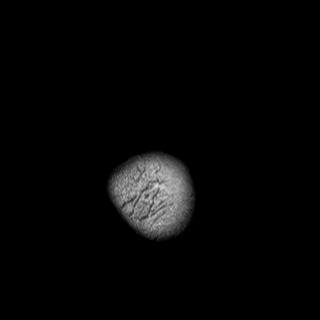
[im 12/36]
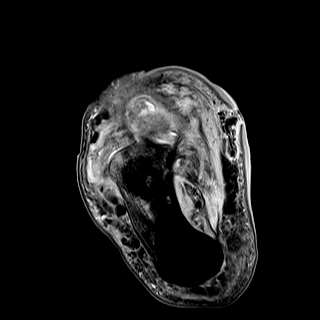
[im 24/36]
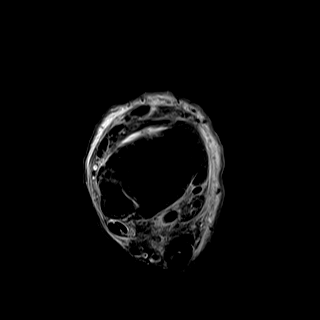
[im 36/36]
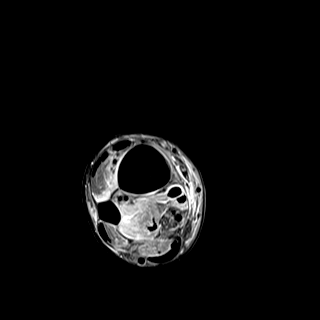

[Series 8: T2 fat-sat · axial · right · 3.0mm · 0.50mm/px · z∈[-37,+103]mm · 4 of 36 slices shown]
[im 1/36]
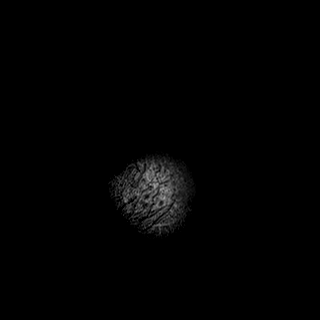
[im 12/36]
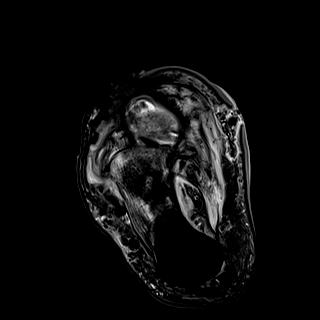
[im 24/36]
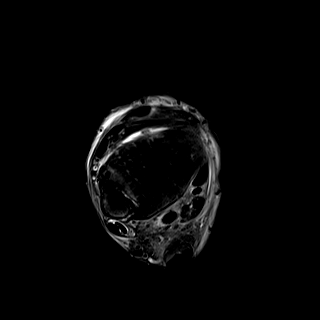
[im 36/36]
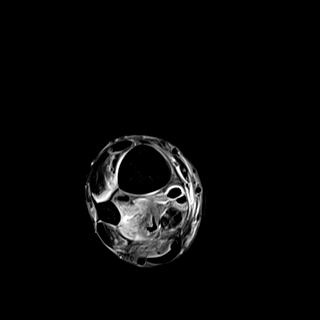

[Series 9: T1 fat-sat · axial · non-contrast · right · 3.0mm · 0.31mm/px · z∈[-37,+103]mm · 4 of 36 slices shown (2 of 2)]
[im 1/36]
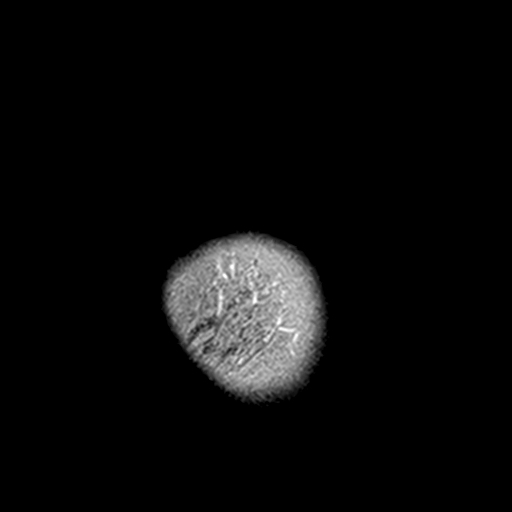
[im 12/36]
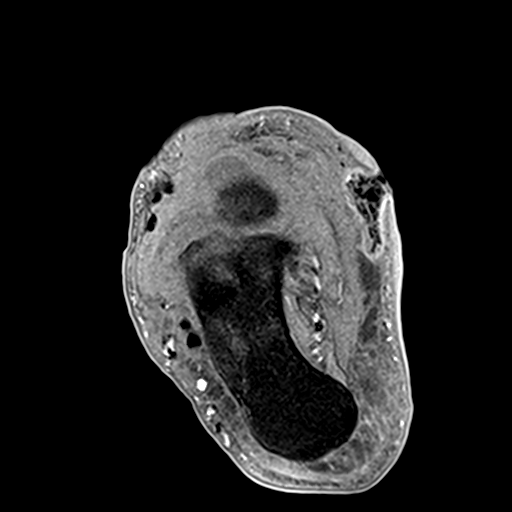
[im 24/36]
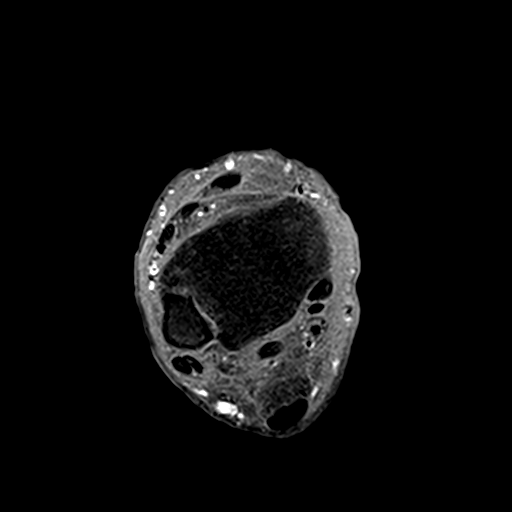
[im 36/36]
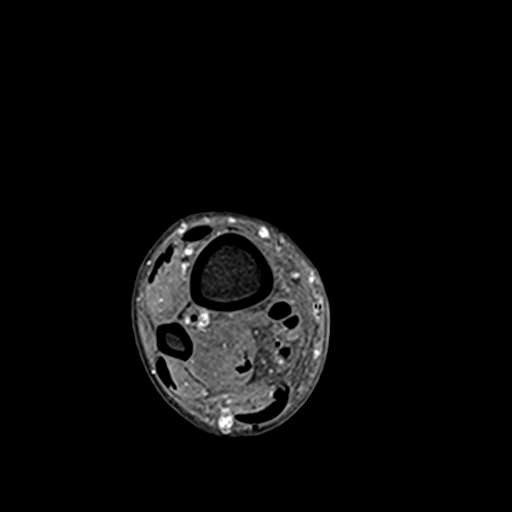

[Series 11: T2 · coronal · right · 3.0mm · 0.62mm/px · 4 of 40 slices shown]
[im 1/40]
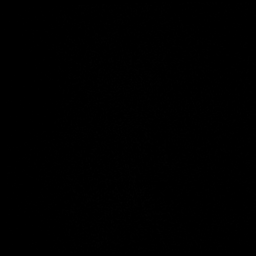
[im 14/40]
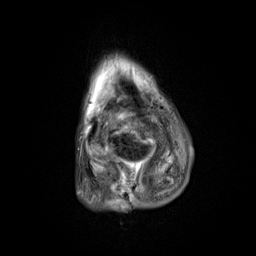
[im 27/40]
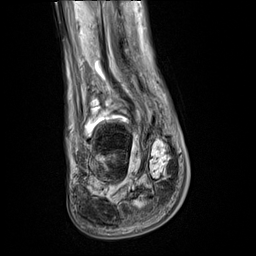
[im 40/40]
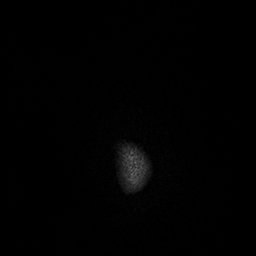

[Series 12: T1 · sagittal · right · 4.0mm · 0.70mm/px · 2 of 21 slices shown (2 of 2)]
[im 1/21]
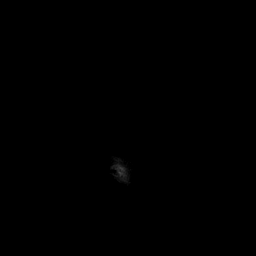
[im 21/21]
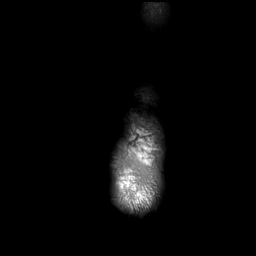

[Series 13: STIR · sagittal · right · 4.0mm · 0.35mm/px · 1 of 21 slices shown]
[im 1/21]
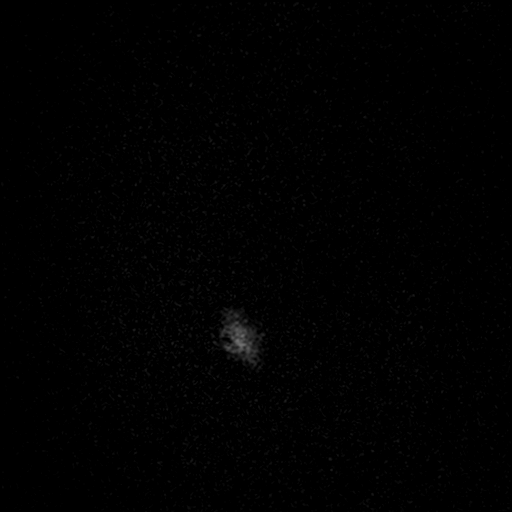

[Series 16: T1 fat-sat post-contrast · axial · right · 3.0mm · 0.31mm/px · z∈[-37,+103]mm · 4 of 36 slices shown (1 of 2)]
[im 1/36]
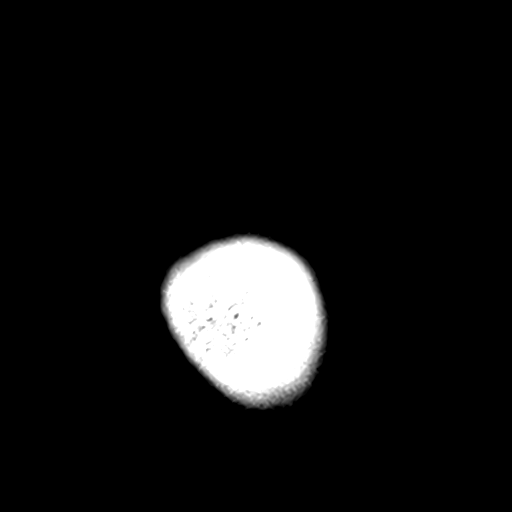
[im 12/36]
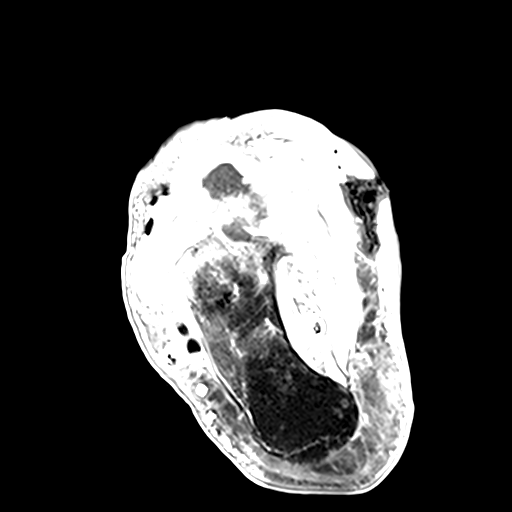
[im 24/36]
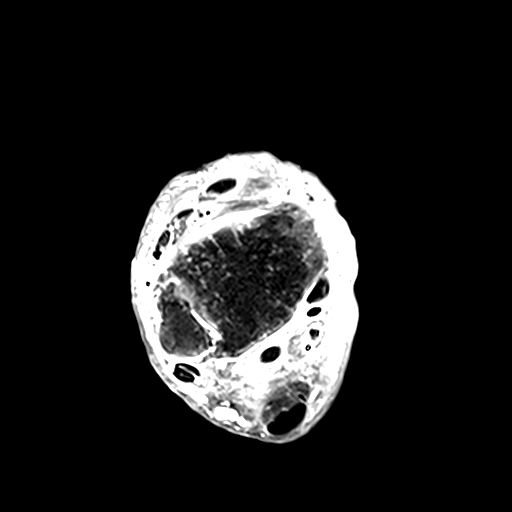
[im 36/36]
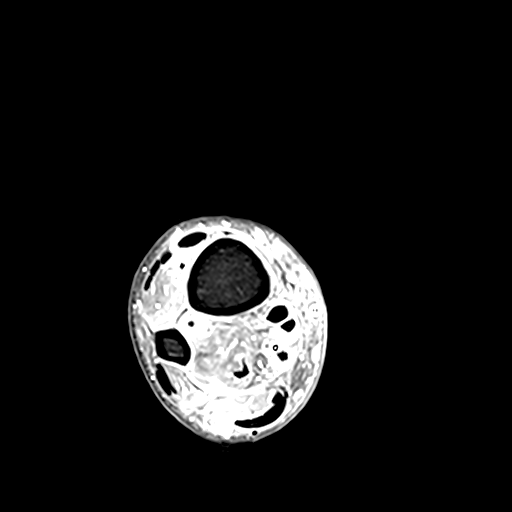

[Series 17: T1 fat-sat post-contrast · coronal · right · 3.0mm · 0.62mm/px · 4 of 39 slices shown (2 of 2)]
[im 1/39]
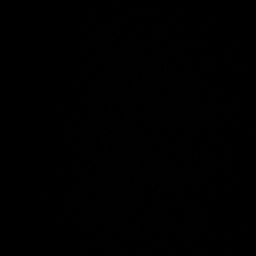
[im 13/39]
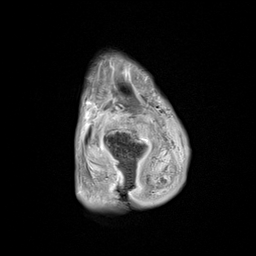
[im 26/39]
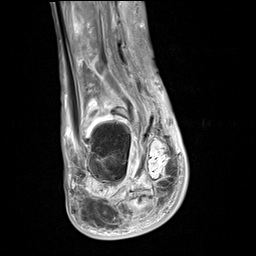
[im 39/39]
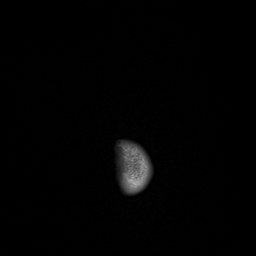

[35 of 40 positions shown; findings below may reference images not displayed]

FINDINGS: Bones: Status post prior 2 parts amputation. There is bone marrow
edema about the talar head and neck. There is also edema of the
anterior process of the calcaneus.

Soft Tissue: There is a peripherally enhancing fluid collection
adjacent to the anterior process of calcaneus measuring at least
x 1.7 x 1.7 cm with a sinus track extending anterior aspect of the
stump. There is another pocket which extends from the
above-mentioned collection to the anterior aspect of the calcaneus
measuring 1.6 x 0.8 cm.

There is another peripherally enhancing fluid collection about
inferomedial aspect of the calcaneus measuring approximately 2.9 x
2.2 x 1.6 cm.

Another peripherally enhancing fluid collection about the medial
aspect of the prior amputation site measuring at least 2.3 x 1.0 by
2.6 cm.

TENDONS

Peroneal: Peroneus longus and brevis are intact with surrounding
edema.

Posteromedial: Posterior tibial tendon intact. Flexor hallucis
longus tendon intact. Flexor digitorum longus tendon intact. Trace
amount of fluid along the tibialis posterior and flexor digitorum.

Anterior: Tibialis anterior tendon intact. Extensor hallucis longus
tendon intact Extensor digitorum longus tendon intact.

Achilles:  Intact.

Plantar Fascia: Intact. Scratch third

LIGAMENTS

Lateral: Anterior talofibular ligament intact. Calcaneofibular
ligament intact. Posterior talofibular ligament intact. Anterior and
posterior tibiofibular ligaments intact.

Medial: Deltoid ligament intact. Spring ligament intact.

CARTILAGE

Ankle Joint: Small joint effusion joint effusion. Normal ankle
mortise. No chondral defect.

Subtalar Joints/Sinus Tarsi: Normal subtalar joints. No subtalar
joint effusion.
IMPRESSION: IMPRESSION
1. At least three peripherally enhancing abscesses about the
anterior and medial aspect of the ankle with sinus tracts with
marked edema of the stump site and phlegmonous changes. Surgical
consultation for further management is recommended.

2. Bone marrow edema of the anterior calcaneal process as well as
talar head and neck consistent with acute osteomyelitis.

3. Skin thickening and generalized subcutaneous soft tissue edema as
well as edema of the muscles of the foot concerning for myositis.

## 2021-10-30 IMAGING — CR DG FOOT COMPLETE 3+V*R*
1 series · 3 of 3 positions shown · non-contrast
Comparison: [DATE]

CLINICAL DATA: History of partial right foot amputation few months
ago. Pain and swelling.

EXAM:
RIGHT FOOT COMPLETE - 3+ VIEW

[Series 1: x foot ap right · 0.14mm/px · 3 of 3 slices shown]
[im 1/3]
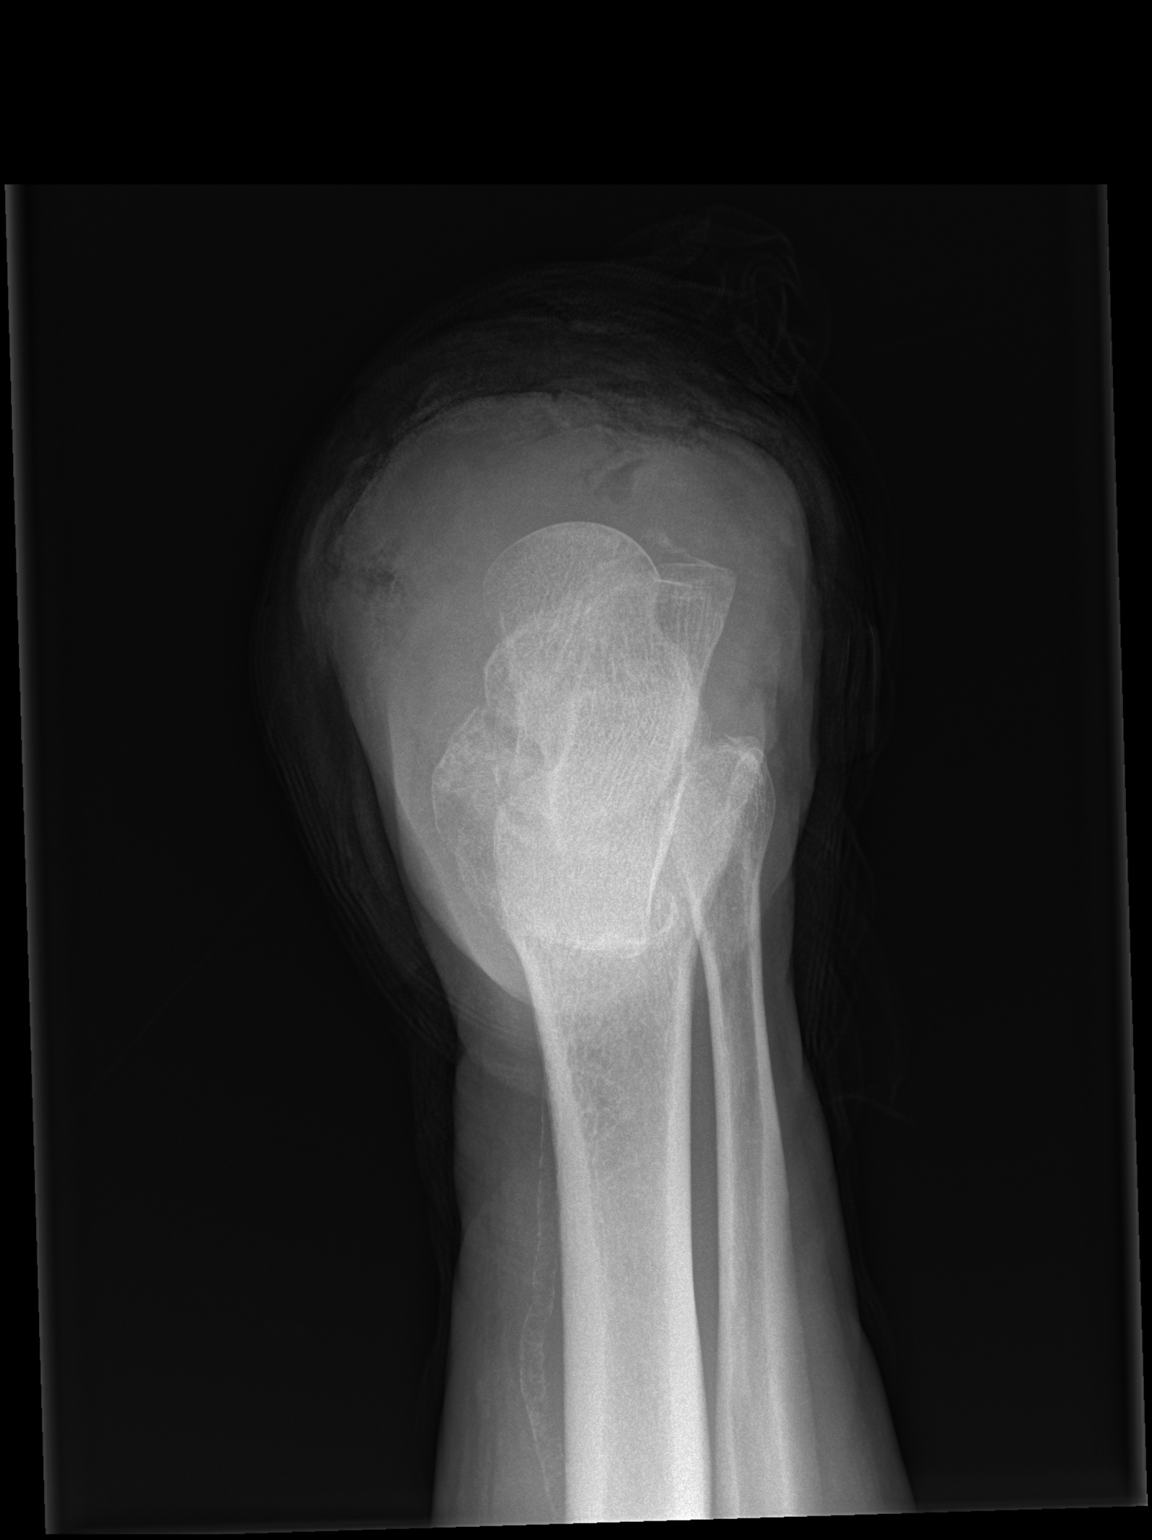
[im 2/3]
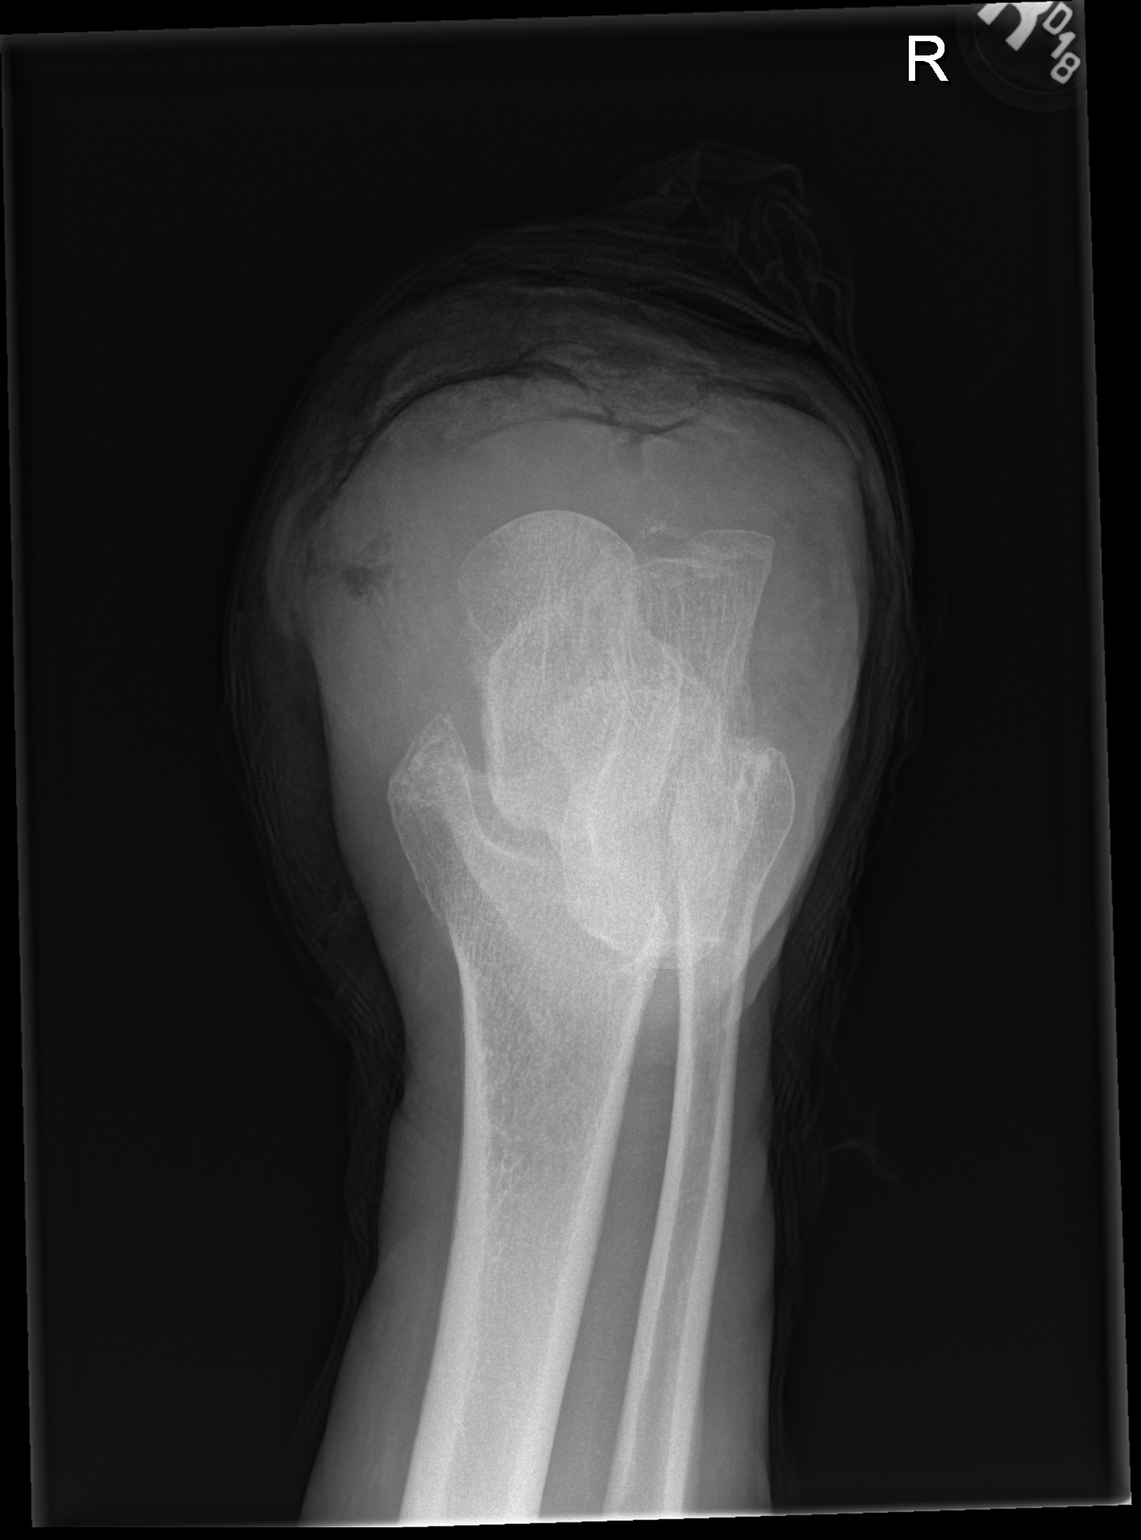
[im 3/3]
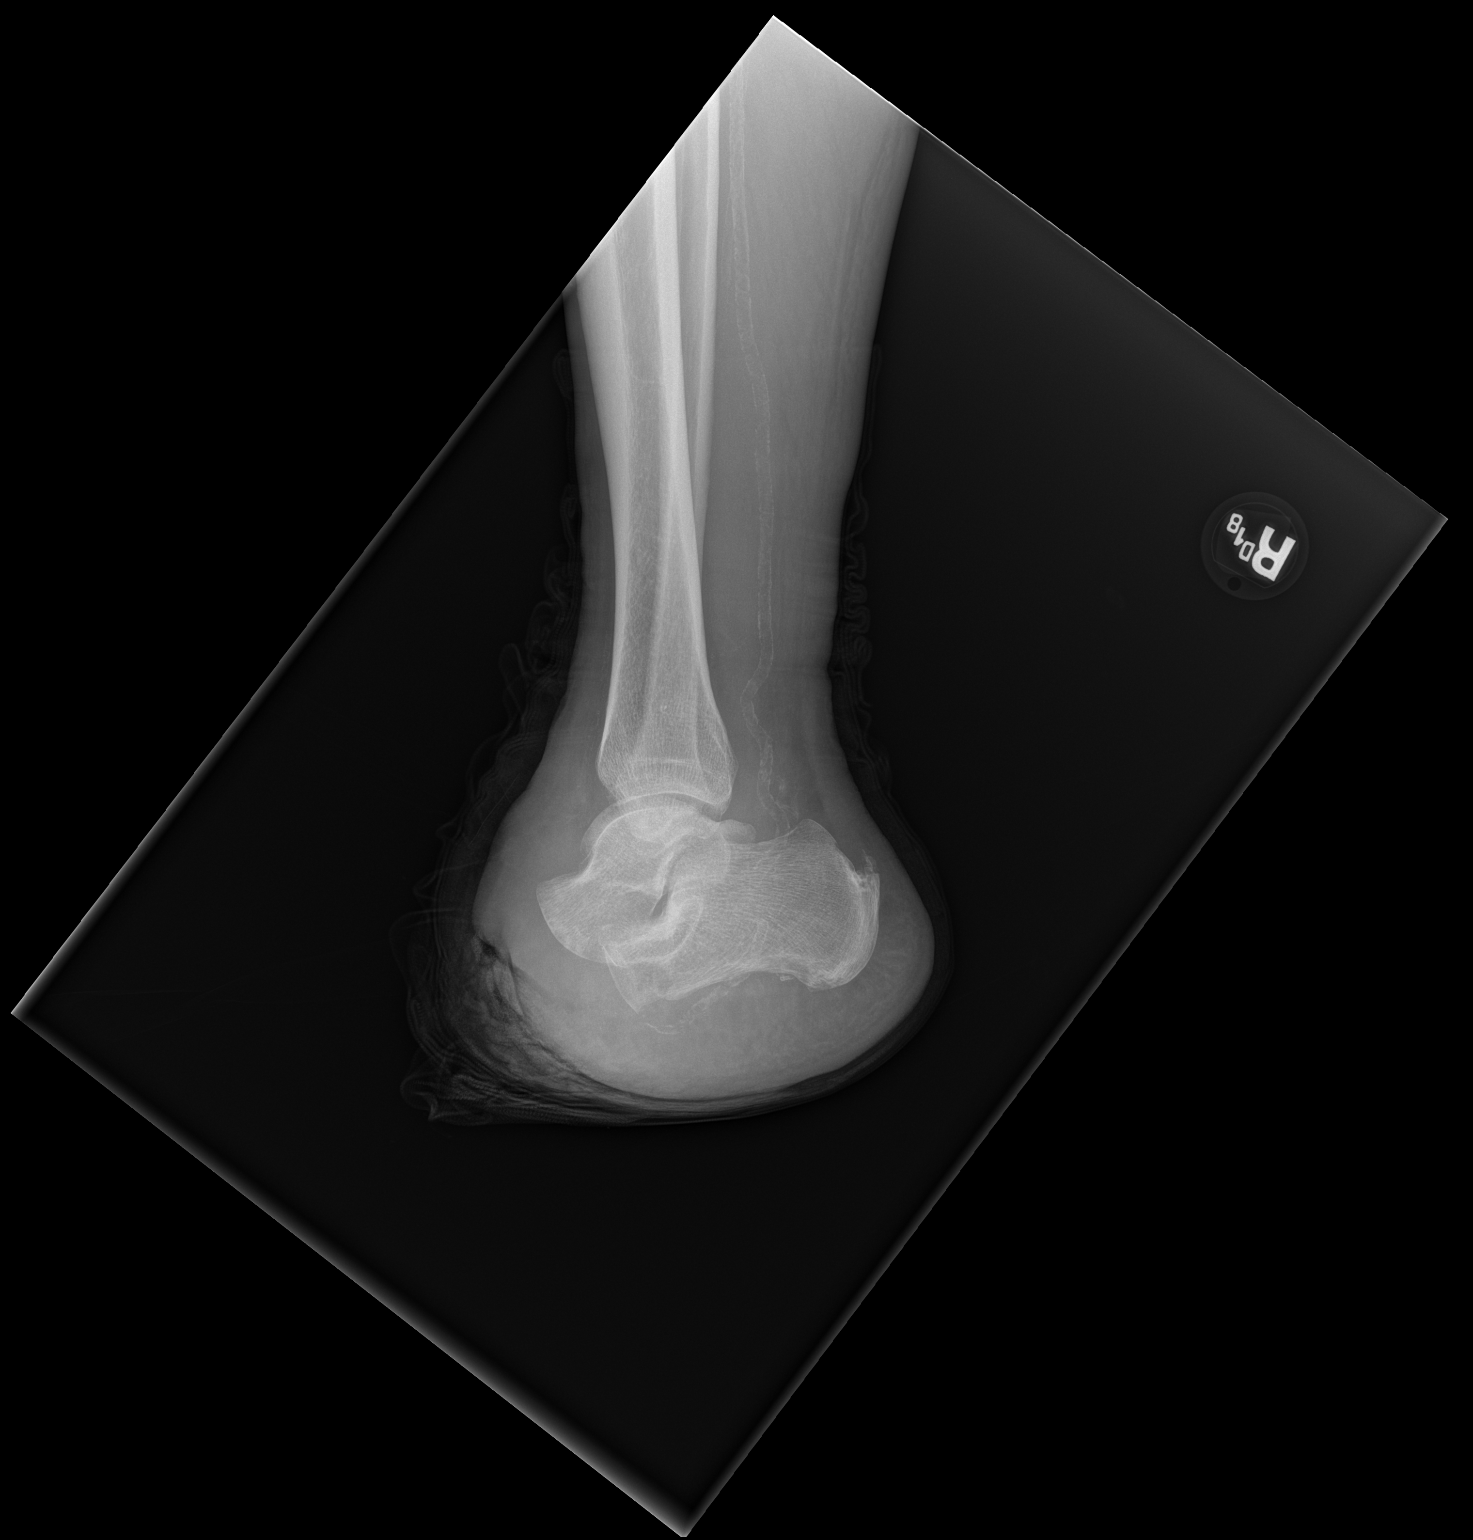

[3 of 3 positions shown; findings below may reference images not displayed]

FINDINGS: Diffuse subcutaneous soft tissue swelling/edema noted around the
hindfoot. There is a soft tissue defect containing gas medially
which is likely an open wound. No definite destructive bony changes
to suggest osteomyelitis. Extensive vascular calcifications.
IMPRESSION: 1. Air containing medial soft tissue defect likely an open wound.
2. No definite plain film findings for osteomyelitis.
3. Diffuse subcutaneous soft tissue swelling/edema.

## 2021-10-30 MED ORDER — VANCOMYCIN HCL 1250 MG/250ML IV SOLN
1250.0000 mg | INTRAVENOUS | Status: DC
  Filled 2021-10-30: qty 250

## 2021-10-30 MED ORDER — SODIUM CHLORIDE 0.9 % IV BOLUS
1000.0000 mL | Freq: Once | INTRAVENOUS | Status: AC
  Administered 2021-10-30: 1000 mL via INTRAVENOUS

## 2021-10-30 MED ORDER — ONDANSETRON HCL 4 MG/2ML IJ SOLN: 4.0000 mg | Freq: Four times a day (QID) | INTRAMUSCULAR | Status: DC | PRN

## 2021-10-30 MED ORDER — ONDANSETRON HCL 4 MG/2ML IJ SOLN
4.0000 mg | Freq: Once | INTRAMUSCULAR | Status: AC
  Administered 2021-10-30: 4 mg via INTRAVENOUS
  Filled 2021-10-30: qty 2

## 2021-10-30 MED ORDER — METRONIDAZOLE 500 MG PO TABS
500.0000 mg | ORAL_TABLET | Freq: Two times a day (BID) | ORAL | Status: DC
  Administered 2021-10-30 – 2021-10-31 (×2): 500 mg via ORAL
  Filled 2021-10-30 (×2): qty 1

## 2021-10-30 MED ORDER — VANCOMYCIN HCL IN DEXTROSE 1-5 GM/200ML-% IV SOLN
1000.0000 mg | Freq: Once | INTRAVENOUS | Status: DC
  Filled 2021-10-30: qty 200

## 2021-10-30 MED ORDER — OXYCODONE HCL 5 MG PO TABS
5.0000 mg | ORAL_TABLET | ORAL | Status: DC | PRN
  Administered 2021-10-31 – 2021-11-07 (×12): 5 mg via ORAL
  Filled 2021-10-30 (×12): qty 1

## 2021-10-30 MED ORDER — VANCOMYCIN HCL 750 MG/150ML IV SOLN
750.0000 mg | Freq: Once | INTRAVENOUS | Status: AC
  Administered 2021-10-30: 750 mg via INTRAVENOUS
  Filled 2021-10-30: qty 150

## 2021-10-30 MED ORDER — SODIUM CHLORIDE 0.9% FLUSH
3.0000 mL | Freq: Two times a day (BID) | INTRAVENOUS | Status: DC
  Administered 2021-10-30 – 2021-11-07 (×14): 3 mL via INTRAVENOUS

## 2021-10-30 MED ORDER — INSULIN DETEMIR 100 UNIT/ML ~~LOC~~ SOLN: 20.0000 [IU] | Freq: Every day | SUBCUTANEOUS | Status: DC

## 2021-10-30 MED ORDER — MORPHINE SULFATE (PF) 2 MG/ML IV SOLN
2.0000 mg | INTRAVENOUS | Status: DC | PRN
  Administered 2021-10-30: 2 mg via INTRAVENOUS
  Filled 2021-10-30: qty 1

## 2021-10-30 MED ORDER — ACETAMINOPHEN 325 MG PO TABS
650.0000 mg | ORAL_TABLET | Freq: Four times a day (QID) | ORAL | Status: DC | PRN
  Administered 2021-11-01 – 2021-11-03 (×4): 650 mg via ORAL
  Filled 2021-10-30 (×4): qty 2

## 2021-10-30 MED ORDER — ALBUTEROL SULFATE (2.5 MG/3ML) 0.083% IN NEBU: 2.5000 mg | INHALATION_SOLUTION | RESPIRATORY_TRACT | Status: DC | PRN

## 2021-10-30 MED ORDER — SODIUM CHLORIDE 0.9 % IV SOLN: INTRAVENOUS | Status: DC

## 2021-10-30 MED ORDER — ONDANSETRON HCL 4 MG PO TABS: 4.0000 mg | ORAL_TABLET | Freq: Four times a day (QID) | ORAL | Status: DC | PRN

## 2021-10-30 MED ORDER — INSULIN GLARGINE-YFGN 100 UNIT/ML ~~LOC~~ SOLN: 15.0000 [IU] | Freq: Every day | SUBCUTANEOUS | Status: DC

## 2021-10-30 MED ORDER — VANCOMYCIN HCL IN DEXTROSE 1-5 GM/200ML-% IV SOLN
1000.0000 mg | Freq: Once | INTRAVENOUS | Status: AC
  Administered 2021-10-30: 1000 mg via INTRAVENOUS
  Filled 2021-10-30: qty 200

## 2021-10-30 MED ORDER — INSULIN ASPART 100 UNIT/ML IJ SOLN
0.0000 [IU] | Freq: Three times a day (TID) | INTRAMUSCULAR | Status: DC
  Administered 2021-10-30: 20 [IU] via SUBCUTANEOUS
  Administered 2021-10-31: 11 [IU] via SUBCUTANEOUS
  Administered 2021-10-31: 4 [IU] via SUBCUTANEOUS
  Administered 2021-10-31: 3 [IU] via SUBCUTANEOUS
  Administered 2021-11-01: 11 [IU] via SUBCUTANEOUS
  Administered 2021-11-01 – 2021-11-03 (×6): 3 [IU] via SUBCUTANEOUS
  Administered 2021-11-04: 15 [IU] via SUBCUTANEOUS
  Administered 2021-11-05: 4 [IU] via SUBCUTANEOUS
  Administered 2021-11-05: 7 [IU] via SUBCUTANEOUS
  Administered 2021-11-05: 4 [IU] via SUBCUTANEOUS
  Administered 2021-11-06: 5 [IU] via SUBCUTANEOUS
  Administered 2021-11-06 – 2021-11-07 (×2): 4 [IU] via SUBCUTANEOUS
  Filled 2021-10-30 (×17): qty 1

## 2021-10-30 MED ORDER — PNEUMOCOCCAL 20-VAL CONJ VACC 0.5 ML IM SUSY
0.5000 mL | PREFILLED_SYRINGE | INTRAMUSCULAR | Status: DC | PRN
Start: 2021-10-31 — End: 2021-11-07
  Filled 2021-10-30: qty 0.5

## 2021-10-30 MED ORDER — MORPHINE SULFATE (PF) 4 MG/ML IV SOLN
4.0000 mg | Freq: Once | INTRAVENOUS | Status: AC
  Administered 2021-10-30: 4 mg via INTRAVENOUS
  Filled 2021-10-30: qty 1

## 2021-10-30 MED ORDER — ACETAMINOPHEN 650 MG RE SUPP: 650.0000 mg | Freq: Four times a day (QID) | RECTAL | Status: DC | PRN

## 2021-10-30 MED ORDER — HEPARIN SODIUM (PORCINE) 5000 UNIT/ML IJ SOLN
5000.0000 [IU] | Freq: Three times a day (TID) | INTRAMUSCULAR | Status: DC
  Administered 2021-10-30 – 2021-10-31 (×2): 5000 [IU] via SUBCUTANEOUS
  Filled 2021-10-30 (×2): qty 1

## 2021-10-30 MED ORDER — GADOBUTROL 1 MMOL/ML IV SOLN
7.0000 mL | Freq: Once | INTRAVENOUS | Status: AC | PRN
  Administered 2021-10-30: 7 mL via INTRAVENOUS

## 2021-10-30 MED ORDER — SODIUM CHLORIDE 0.9 % IV SOLN
2.0000 g | Freq: Once | INTRAVENOUS | Status: AC
  Administered 2021-10-30: 2 g via INTRAVENOUS
  Filled 2021-10-30: qty 12.5

## 2021-10-30 MED ORDER — SODIUM CHLORIDE 0.9 % IV SOLN
2.0000 g | Freq: Two times a day (BID) | INTRAVENOUS | Status: DC
  Administered 2021-10-30: 2 g via INTRAVENOUS
  Filled 2021-10-30: qty 2
  Filled 2021-10-30: qty 12.5

## 2021-10-30 MED ORDER — INSULIN DETEMIR 100 UNIT/ML ~~LOC~~ SOLN
20.0000 [IU] | Freq: Two times a day (BID) | SUBCUTANEOUS | Status: DC
  Administered 2021-10-30 – 2021-11-07 (×14): 20 [IU] via SUBCUTANEOUS
  Filled 2021-10-30 (×17): qty 0.2

## 2021-10-30 NOTE — ED Notes (Signed)
RN informed bed assigned 

## 2021-10-30 NOTE — Progress Notes (Addendum)
PHARMACY -  BRIEF ANTIBIOTIC NOTE  ? ?Pharmacy has received consult(s) for vancomycin and cefepime from an ED provider.  The patient's profile has been reviewed for ht/wt/allergies/indication/available labs.   ? ?One time order(s) placed for: ?Cefepime 2 g IV ?Vancomycin 1750 mg IV ? ?Further antibiotics/pharmacy consults should be ordered by admitting physician if indicated.       ?                ?Thank you, ?Jamyria Ozanich O Inocente Krach ?10/30/2021  12:00 PM ? ?

## 2021-10-30 NOTE — Progress Notes (Signed)
Pharmacy Antibiotic Note ? ?Jesus Flowers is a 55 y.o. 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  Pharmacy has been consulted for vancomycin and cefepime dosing for suspected diabetic foot infection. ? ?Plan: ?Cefepime 2 g IV Q12H ?Vancomycin 1250 mg IV Q24H ?Est AUC: 546 ?Used: Scr 1.57, IBW, Vd 0.72 ?Obtain vanc levels around 4th or 5th dose if continued ?Monitor renal function and adjust dose as clinically indicated ? ?Height: 5\' 6"  (167.6 cm) ?Weight: 72.6 kg (160 lb) ?IBW/kg (Calculated) : 63.8 ? ?Temp (24hrs), Avg:98.1 ?F (36.7 ?C), Min:98.1 ?F (36.7 ?C), Max:98.1 ?F (36.7 ?C) ? ?Recent Labs  ?Lab 10/30/21 ?1038 10/30/21 ?1209  ?WBC 11.8*  --   ?CREATININE 1.57*  --   ?LATICACIDVEN 1.0 1.0  ?  ?Estimated Creatinine Clearance: 47.4 mL/min (A) (by C-G formula based on SCr of 1.57 mg/dL (H)).   ? ?No Known Allergies ? ?Antimicrobials this admission: ?4/4 metronidazole >>  ?4/4 cefepime >>  ?4/4 vancomycin >>  ? ? ?Microbiology results: ? BCx:  ? UCx:   ? Sputum:   ? MRSA PCR:  ? ?Thank you for allowing pharmacy to be a part of this patient?s care. ? ?6/4 Jesus Flowers ?10/30/2021 3:33 PM ? ?

## 2021-10-30 NOTE — ED Provider Notes (Signed)
? ? ?[facility] ?Emergency Department Provider Note ? ? ? ? Event Date/Time  ? First MD Initiated Contact with Patient 10/30/21 1139   ?  (approximate) ? ? ?History  ? ?Foot Pain ? ? ?HPI ? ?Jesus Flowers is a 56 y.o. male with a history of AKI, HTN, DM, sepsis, and osteomyelitis of the right foot which is s/p chopart's amputation, presents for progressive right foot infection/cellulitis/abscess. He presents at the advice of Dr. Lilian KapurMcDonald after office visit yesterday. He was seen yesterday for foot wound check. Based on chart review, Dr. Lilian KapurMcDonald expects that the patient poor prognosis, he would benefit from a BKA and prosthetic. Patient presents to the ED today for admission and surgical intervention. He denies fevers, chills, or sweats.  ? ?Physical Exam  ? ?Triage Vital Signs: ?ED Triage Vitals  ?Enc Vitals Group  ?   BP 10/30/21 1034 (!) 150/85  ?   Pulse Rate 10/30/21 1034 88  ?   Resp 10/30/21 1034 16  ?   Temp 10/30/21 1034 98.1 ?F (36.7 ?C)  ?   Temp Source 10/30/21 1034 Oral  ?   SpO2 10/30/21 1034 97 %  ?   Weight 10/30/21 1032 (!) 352 lb 11.8 oz (160 kg)  ?   Height 10/30/21 1111 5\' 6"  (1.676 m)  ?   Head Circumference --   ?   Peak Flow --   ?   Pain Score 10/30/21 1032 8  ?   Pain Loc --   ?   Pain Edu? --   ?   Excl. in GC? --   ? ? ?Most recent vital signs: ?Vitals:  ? 10/30/21 1301 10/30/21 1330  ?BP:  (!) 153/93  ?Pulse: 80 78  ?Resp: 15 14  ?Temp:    ?SpO2: 100% 98%  ? ? ?General Awake, no distress.  ?CV:  Good peripheral perfusion.  ?RESP:  Normal effort.  ?ABD:  No distention.  ?MSK:  Right foot with midfoot amputation with and two open wound distally. Mild proximal erythema without induration or purulence noted.  ? ? ?ED Results / Procedures / Treatments  ? ?Labs ?(all labs ordered are listed, but only abnormal results are displayed) ?Labs Reviewed  ?COMPREHENSIVE METABOLIC PANEL - Abnormal; Notable for the following components:  ?    Result Value  ? Sodium 128 (*)   ?  Potassium 5.2 (*)   ? Chloride 94 (*)   ? Glucose, Bld 557 (*)   ? BUN 42 (*)   ? Creatinine, Ser 1.57 (*)   ? Calcium 8.7 (*)   ? Albumin 2.8 (*)   ? Alkaline Phosphatase 289 (*)   ? GFR, Estimated 51 (*)   ? All other components within normal limits  ?CBC WITH DIFFERENTIAL/PLATELET - Abnormal; Notable for the following components:  ? WBC 11.8 (*)   ? RBC 3.52 (*)   ? Hemoglobin 10.8 (*)   ? HCT 32.2 (*)   ? Neutro Abs 9.9 (*)   ? Abs Immature Granulocytes 0.08 (*)   ? All other components within normal limits  ?URINALYSIS, ROUTINE W REFLEX MICROSCOPIC - Abnormal; Notable for the following components:  ? Color, Urine STRAW (*)   ? APPearance CLEAR (*)   ? Glucose, UA >=500 (*)   ? Protein, ur 100 (*)   ? All other components within normal limits  ?CULTURE, BLOOD (ROUTINE X 2)  ?CULTURE, BLOOD (ROUTINE X 2)  ?LACTIC ACID, PLASMA  ?LACTIC ACID, PLASMA  ?  ETHANOL  ?HEMOGLOBIN A1C  ?SEDIMENTATION RATE  ?C-REACTIVE PROTEIN  ?PREALBUMIN  ?CBC  ?BASIC METABOLIC PANEL  ?MAGNESIUM  ?PHOSPHORUS  ? ? ? ?EKG ? ? ? ?RADIOLOGY ? ?I personally viewed and evaluated these images as part of my medical decision making, as well as reviewing the written report by the radiologist. ? ?ED Provider Interpretation: gas and bony changes noted} ? ?DG Foot Complete Right ? ?Result Date: 10/30/2021 ?CLINICAL DATA:  History of partial right foot amputation few months ago. Pain and swelling. EXAM: RIGHT FOOT COMPLETE - 3+ VIEW COMPARISON:  08/14/2021 FINDINGS: Diffuse subcutaneous soft tissue swelling/edema noted around the hindfoot. There is a soft tissue defect containing gas medially which is likely an open wound. No definite destructive bony changes to suggest osteomyelitis. Extensive vascular calcifications. IMPRESSION: 1. Air containing medial soft tissue defect likely an open wound. 2. No definite plain film findings for osteomyelitis. 3. Diffuse subcutaneous soft tissue swelling/edema. Electronically Signed   By: Rudie Meyer M.D.   On:  10/30/2021 11:22   ? ? ?PROCEDURES: ? ?Critical Care performed: Yes, see critical care procedure note(s) ? ?Procedures ? ?CRITICAL CARE ?Performed by: Lissa Hoard ? ? ?Total critical care time: 30 minutes ? ?Critical care time was exclusive of separately billable procedures and treating other patients. ? ?Critical care was necessary to treat or prevent imminent or life-threatening deterioration. ? ?Critical care was time spent personally by me on the following activities: development of treatment plan with patient and/or surrogate as well as nursing, discussions with consultants, evaluation of patient's response to treatment, examination of patient, obtaining history from patient or surrogate, ordering and performing treatments and interventions, ordering and review of laboratory studies, ordering and review of radiographic studies, pulse oximetry and re-evaluation of patient's condition.  ?MEDICATIONS ORDERED IN ED: ?Medications  ?metroNIDAZOLE (FLAGYL) tablet 500 mg (has no administration in time range)  ?insulin aspart (novoLOG) injection 0-20 Units (has no administration in time range)  ?heparin injection 5,000 Units (has no administration in time range)  ?sodium chloride flush (NS) 0.9 % injection 3 mL (has no administration in time range)  ?0.9 %  sodium chloride infusion (has no administration in time range)  ?ondansetron (ZOFRAN) tablet 4 mg (has no administration in time range)  ?  Or  ?ondansetron (ZOFRAN) injection 4 mg (has no administration in time range)  ?acetaminophen (TYLENOL) tablet 650 mg (has no administration in time range)  ?  Or  ?acetaminophen (TYLENOL) suppository 650 mg (has no administration in time range)  ?oxyCODONE (Oxy IR/ROXICODONE) immediate release tablet 5 mg (has no administration in time range)  ?morphine (PF) 2 MG/ML injection 2 mg (has no administration in time range)  ?albuterol (PROVENTIL) (2.5 MG/3ML) 0.083% nebulizer solution 2.5 mg (has no administration in time  range)  ?insulin detemir (LEVEMIR) injection 20 Units (has no administration in time range)  ?sodium chloride 0.9 % bolus 1,000 mL (0 mLs Intravenous Stopped 10/30/21 1409)  ?ceFEPIme (MAXIPIME) 2 g in sodium chloride 0.9 % 100 mL IVPB (0 g Intravenous Stopped 10/30/21 1256)  ?vancomycin (VANCOCIN) IVPB 1000 mg/200 mL premix (0 mg Intravenous Stopped 10/30/21 1330)  ?  Followed by  ?vancomycin (VANCOREADY) IVPB 750 mg/150 mL (0 mg Intravenous Stopped 10/30/21 1515)  ?ondansetron (ZOFRAN) injection 4 mg (4 mg Intravenous Given 10/30/21 1253)  ?morphine (PF) 4 MG/ML injection 4 mg (4 mg Intravenous Given 10/30/21 1255)  ? ? ? ?IMPRESSION / MDM / ASSESSMENT AND PLAN / ED COURSE  ?I reviewed the triage  vital signs and the nursing notes. ?             ?               ? ?Differential diagnosis includes, but is not limited to, sepsis, osteomyelitis, cellulitis, abscess ? ?The patient is on the cardiac monitor to evaluate for evidence of arrhythmia and/or significant heart rate changes. ? ?Patient's diagnosis is consistent with osteomyelitis and progressive foot infection requiring revision of his prior amputation to a BKA.  Patient presents in no acute distress at the advice of his podiatrist.  Patient is started on empiric doses of vancomycin and cefepime with an MRI pending at this time.  Patient will be admitted to the hospital service for surgery consultation.   ? ? ?FINAL CLINICAL IMPRESSION(S) / ED DIAGNOSES  ? 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 ? ? ?Rx / DC Orders  ? ?ED Discharge Orders   ? ? None  ? ?  ? ? ? ?Note:  This document was prepared using Dragon voice recognition software and may include unintentional dictation errors. ? ?  ?Lissa Hoard, PA-C ?10/30/21 1534 ? ?  ?Chesley Noon, MD ?10/30/21 1544 ? ?

## 2021-10-30 NOTE — H&P (Signed)
History and Physical    Jesus Flowers UEA:[id number] DOB: June 02, 1966 DOA: 10/30/2021  PCP: [facility]  Patient coming from: home  I have personally briefly reviewed patient's old medical records in [facility]  Chief Complaint: diabetic foot infection referred by podiatry  HPI: Jesus Flowers is a 56 y.o. 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 Patient then returns 07/2021 with foot infection and diagnosed with residual osteomyelitis at site of R foot TMA. 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 Patient now returns to ed after follow with podiatry 10/30/22 with diagnosis of "severe dehiscence and necrosis of wound and concern for osteomyelitis of the remaining talus and calcaneus, probable septic arthritis of  subtalar and as well as ankle." Per podiatry noted. Per podiatry will need BKA .Patient was referred to ED yesterday but presented today. Per patient he noted worsening pain s/p fall and injury 3-4 days ago.  He notes increase swelling drainage and odor. He notes associated chills but no fever, n/v/d. ON further ros no sob/ chest pain / cough / uri signs and symptoms. He notes he is complaint with is diabetic medication.  ED Course:  Afeb, bp 150/85, hr 88, rr 16, sat 97%  Labs: 11.8, hgb 10.8, pt 352,pmn 9.9 Lactic 1 NA: 128, K 5.2, glu 557, cr 1.57 (1.13), alkphos 289( chronic elevation) Etoh <10  Right foot xray  IMPRESSION: 1. Air containing medial soft tissue defect likely an open wound. 2. No definite plain film findings for osteomyelitis. 3. Diffuse subcutaneous soft tissue swelling/edema.   MRI pending  Review of Systems: As per HPI otherwise 10 point review of systems negative.   Past Medical  History:  Diagnosis Date   Depression    Diabetes mellitus without complication (HCC)    High cholesterol     Past Surgical History:  Procedure Laterality Date   AMPUTATION Right 08/14/2021   Procedure: AMPUTATION FOOT;  Surgeon: Felecia Shelling, DPM;  Location: [facility] ORS;  Service: Podiatry;  Laterality: Right;   BACK SURGERY     INCISION AND DRAINAGE Right 08/14/2021   Procedure: INCISION AND DRAINAGE;  Surgeon: Felecia Shelling, DPM;  Location: [facility] ORS;  Service: Podiatry;  Laterality: Right;   IRRIGATION AND DEBRIDEMENT FOOT Right 05/06/2021   Procedure: IRRIGATION AND DEBRIDEMENT FOOT;  Surgeon: [name], DPM;  Location: [facility] ORS;  Service: Podiatry;  Laterality: Right;   TRANSMETATARSAL AMPUTATION Right 05/09/2021   Procedure: TRANSMETATARSAL AMPUTATION;  Surgeon: [name], DPM;  Location: [facility] ORS;  Service: Podiatry;  Laterality: Right;     reports that he has never smoked. He has never used smokeless tobacco. He reports current alcohol use. He reports that he does not use drugs.  No Known Allergies  Family History  Problem Relation Age of Onset   Diabetes Mellitus II Sister     Prior to Admission medications   Medication Sig Start Date End Date Taking? Authorizing Provider  FLUoxetine (PROZAC) 40 MG capsule Take 40 mg by mouth daily as needed. 01/31/21   [provider]  insulin glargine (LANTUS) 100 UNIT/ML injection Inject 0.15 mLs (15 Units total) into the skin daily. 08/16/21 11/14/21  Darlin Priestly, MD  JARDIANCE 25 MG TABS tablet Take 25 mg by mouth daily. 01/31/21  [provider]  lisinopril (ZESTRIL) 10 MG tablet Take 10 mg by mouth daily. 05/15/21   [provider]  lovastatin (MEVACOR) 20 MG tablet Take 20 mg by mouth daily. 06/11/21   [provider]  Multiple Vitamin (MULTIVITAMIN WITH MINERALS) TABS tablet Take 1 tablet by mouth daily. 05/12/21   Standley Brooking, MD  sitaGLIPtin-metformin (JANUMET) 50-1000 MG tablet  Take 1 tablet by mouth 2 (two) times daily with a meal.    [provider]    Physical Exam: Vitals:   10/30/21 1151 10/30/21 1230 10/30/21 1300 10/30/21 1301  BP: (!) 148/80 (!) 161/100 (!) 176/93   Pulse: 80 78 65 80  Resp: 16  15 15   Temp:      TempSrc:      SpO2: 98% 97% 91% 100%  Weight:    72.6 kg  Height:         Vitals:   10/30/21 1151 10/30/21 1230 10/30/21 1300 10/30/21 1301  BP: (!) 148/80 (!) 161/100 (!) 176/93   Pulse: 80 78 65 80  Resp: 16  15 15   Temp:      TempSrc:      SpO2: 98% 97% 91% 100%  Weight:    72.6 kg  Height:      Constitutional: NAD, calm, comfortable Eyes: PERRL, lids and conjunctivae normal ENMT: Mucous membranes are moist. Posterior pharynx clear of any exudate or lesions.Normal dentition.  Neck: normal, supple, no masses, no thyromegaly Respiratory: clear to auscultation bilaterally, no wheezing, no crackles. Normal respiratory effort. No accessory muscle use.  Cardiovascular: Regular rate and rhythm, no murmurs / rubs / gallops. No extremity edema. 2+ pedal pulses. No carotid bruits.  Abdomen: no tenderness, no masses palpated. No hepatosplenomegaly. Bowel sounds positive.  Musculoskeletal: no clubbing / cyanosis. No joint deformity upper  extremities. Right foot s/p TMA,Chopart dressing c/d/I warmth mild erythema above dressing. Good ROM, no contractures. Normal muscle tone.  Skin: no rashes, lesions, ulcers. No induration Neurologic: CN 2-12 grossly intact. Sensation intact, DTR normal. Strength 5/5 in all 4.  Psychiatric: Normal judgment and insight. Alert and oriented x 3. Normal mood.    Labs on Admission: I have personally reviewed following labs and imaging studies  CBC: Recent Labs  Lab 10/30/21 1038  WBC 11.8*  NEUTROABS 9.9*  HGB 10.8*  HCT 32.2*  MCV 91.5  PLT 352   Basic Metabolic Panel: Recent Labs  Lab 10/30/21 1038  NA 128*  K 5.2*  CL 94*  CO2 25  GLUCOSE 557*  BUN 42*  CREATININE 1.57*  CALCIUM  8.7*   GFR: Estimated Creatinine Clearance: 47.4 mL/min (A) (by C-G formula based on SCr of 1.57 mg/dL (H)). Liver Function Tests: Recent Labs  Lab 10/30/21 1038  AST 27  ALT 34  ALKPHOS 289*  BILITOT 0.5  PROT 7.8  ALBUMIN 2.8*   No results for input(s): LIPASE, AMYLASE in the last 168 hours. No results for input(s): AMMONIA in the last 168 hours. Coagulation Profile: No results for input(s): INR, PROTIME in the last 168 hours. Cardiac Enzymes: No results for input(s): CKTOTAL, CKMB, CKMBINDEX, TROPONINI in the last 168 hours. BNP (last 3 results) No results for input(s): PROBNP in the last 8760 hours. HbA1C: No results for input(s): HGBA1C in the last 72 hours. CBG: No results for input(s): GLUCAP in the last 168 hours. Lipid Profile: No results for input(s): CHOL, HDL, LDLCALC, TRIG, CHOLHDL, LDLDIRECT in the last 72 hours. Thyroid Function Tests: No results for  input(s): TSH, T4TOTAL, FREET4, T3FREE, THYROIDAB in the last 72 hours. Anemia Panel: No results for input(s): VITAMINB12, FOLATE, FERRITIN, TIBC, IRON, RETICCTPCT in the last 72 hours. Urine analysis:    Component Value Date/Time   COLORURINE STRAW (A) 05/05/2021 2215   APPEARANCEUR CLEAR (A) 05/05/2021 2215   LABSPEC 1.025 05/05/2021 2215   PHURINE 5.0 05/05/2021 2215   GLUCOSEU >=500 (A) 05/05/2021 2215   HGBUR SMALL (A) 05/05/2021 2215   BILIRUBINUR NEGATIVE 05/05/2021 2215   KETONESUR 5 (A) 05/05/2021 2215   PROTEINUR 100 (A) 05/05/2021 2215   NITRITE NEGATIVE 05/05/2021 2215   LEUKOCYTESUR NEGATIVE 05/05/2021 2215    Radiological Exams on Admission: DG Foot Complete Right  Result Date: 10/30/2021 CLINICAL DATA:  History of partial right foot amputation few months ago. Pain and swelling. EXAM: RIGHT FOOT COMPLETE - 3+ VIEW COMPARISON:  08/14/2021 FINDINGS: Diffuse subcutaneous soft tissue swelling/edema noted around the hindfoot. There is a soft tissue defect containing gas medially which is likely  an open wound. No definite destructive bony changes to suggest osteomyelitis. Extensive vascular calcifications. IMPRESSION: 1. Air containing medial soft tissue defect likely an open wound. 2. No definite plain film findings for osteomyelitis. 3. Diffuse subcutaneous soft tissue swelling/edema. Electronically Signed   By: Rudie Meyer M.D.   On: 10/30/2021 11:22    EKG: Independently reviewed. N/a  Assessment/Plan severe dehiscence and necrosis of wound  with complicated soft tissue infection with Concern for osteo -admit to med tele  - start broad spectrum abx vanc/cefepime -MRI right foot  -podiatry / surgery consult for evaluation of further intervention  BKA proposed  Uncontrolled DMII -- A1C --resume long acting insulin  --fs iss  AKI  -ivfs  -hold nephrotoxic medications    Hyponatremia -psuedo due to elevated sugars  -continue ivfs  -monitor labs    ETOH -place on ciwa   HLD -continue statin   Depression  -cont home regimen    DVT prophylaxis: hepairn Code Status: full Family Communication: n/a Disposition Plan: patient  expected to be admitted greater than 2 midnights  Consults called: podiatry  Admission status: inpatient   Lurline Del MD [facility]   If 7PM-7AM, please contact night-coverage [email or web address] Password [email or web address]  10/30/2021, 1:10 PM

## 2021-10-30 NOTE — Progress Notes (Signed)
? ?      CROSS COVER NOTE ? ?NAME: Jesus Flowers ?MRN: [id number] ?DOB : [birth year] ? ? ? ?Date of Service ?  10/30/2021  ?HPI/Events of Note ?  Page received from [facility] Radiology with MRI results consistent with osteomyelitis.   ?Interventions ?  Plan: ?Continue Vancomycin and Cefepime as ordered ?Podiatry/surgery consult pending ? ?   ?   ? ?Bishop Limbo MHA, MSN, FNP-BC ?Nurse Practitioner ?[facility] ?[place] ?Pager [phone] ? ?

## 2021-10-30 NOTE — ED Notes (Signed)
Pt transported to xray 

## 2021-10-30 NOTE — ED Triage Notes (Signed)
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 Pt states that he has a deep hole in his foot and that it is infected. Pt reports that his doctor advised to him to come to the ED. Pt has drainage on his sock. Pt denies fever.  ?

## 2021-10-30 NOTE — ED Notes (Signed)
See triage note. Pt has DM, pt fell on R foot Saturday. Same foot had recent toe amputation. Foot is  throbbing, severe pain. Cultures drawn, Ivs placed.  ? ?Pt states is an alcoholic but not giving direct answer about last drink or how often drinks.  ?

## 2021-10-31 ENCOUNTER — Inpatient Hospital Stay: Payer: Self-pay

## 2021-10-31 DIAGNOSIS — M869 Osteomyelitis, unspecified: Secondary | ICD-10-CM

## 2021-10-31 DIAGNOSIS — E1169 Type 2 diabetes mellitus with other specified complication: Secondary | ICD-10-CM

## 2021-10-31 LAB — CBC
HCT: 26.6 % — ABNORMAL LOW (ref 39.0–52.0)
Hemoglobin: 8.8 g/dL — ABNORMAL LOW (ref 13.0–17.0)
MCH: 30.2 pg (ref 26.0–34.0)
MCHC: 33.1 g/dL (ref 30.0–36.0)
MCV: 91.4 fL (ref 80.0–100.0)
Platelets: 296 10*3/uL (ref 150–400)
RBC: 2.91 MIL/uL — ABNORMAL LOW (ref 4.22–5.81)
RDW: 12 % (ref 11.5–15.5)
WBC: 9.5 10*3/uL (ref 4.0–10.5)
nRBC: 0 % (ref 0.0–0.2)

## 2021-10-31 LAB — COMPREHENSIVE METABOLIC PANEL
ALT: 23 U/L (ref 0–44)
AST: 15 U/L (ref 15–41)
Albumin: 2.4 g/dL — ABNORMAL LOW (ref 3.5–5.0)
Alkaline Phosphatase: 176 U/L — ABNORMAL HIGH (ref 38–126)
Anion gap: 7 (ref 5–15)
BUN: 30 mg/dL — ABNORMAL HIGH (ref 6–20)
CO2: 24 mmol/L (ref 22–32)
Calcium: 8.4 mg/dL — ABNORMAL LOW (ref 8.9–10.3)
Chloride: 105 mmol/L (ref 98–111)
Creatinine, Ser: 1.01 mg/dL (ref 0.61–1.24)
GFR, Estimated: >60 mL/min (ref >60)
Glucose, Bld: 110 mg/dL — ABNORMAL HIGH (ref 70–99)
Potassium: 4.4 mmol/L (ref 3.5–5.1)
Sodium: 136 mmol/L (ref 135–145)
Total Bilirubin: 0.5 mg/dL (ref 0.3–1.2)
Total Protein: 6.5 g/dL (ref 6.5–8.1)

## 2021-10-31 LAB — GLUCOSE, CAPILLARY
Glucose-Capillary: 122 mg/dL — ABNORMAL HIGH (ref 70–99)
Glucose-Capillary: 152 mg/dL — ABNORMAL HIGH (ref 70–99)
Glucose-Capillary: 281 mg/dL — ABNORMAL HIGH (ref 70–99)
Glucose-Capillary: 212 mg/dL — ABNORMAL HIGH (ref 70–99)

## 2021-10-31 MED ORDER — ZINC SULFATE 220 (50 ZN) MG PO CAPS
220.0000 mg | ORAL_CAPSULE | Freq: Every day | ORAL | Status: DC
  Administered 2021-10-31 – 2021-11-07 (×6): 220 mg via ORAL
  Filled 2021-10-31 (×6): qty 1

## 2021-10-31 MED ORDER — FLUOXETINE HCL 20 MG PO CAPS
40.0000 mg | ORAL_CAPSULE | Freq: Every day | ORAL | Status: DC
  Administered 2021-10-31 – 2021-11-07 (×6): 40 mg via ORAL
  Filled 2021-10-31 (×8): qty 2

## 2021-10-31 MED ORDER — SODIUM CHLORIDE 0.9 % IV SOLN
3.0000 g | Freq: Four times a day (QID) | INTRAVENOUS | Status: AC
  Administered 2021-10-31 – 2021-11-06 (×25): 3 g via INTRAVENOUS
  Filled 2021-10-31: qty 3
  Filled 2021-10-31 (×2): qty 8
  Filled 2021-10-31 (×2): qty 3
  Filled 2021-10-31: qty 8
  Filled 2021-10-31: qty 3
  Filled 2021-10-31: qty 8
  Filled 2021-10-31 (×4): qty 3
  Filled 2021-10-31 (×3): qty 8
  Filled 2021-10-31: qty 3
  Filled 2021-10-31: qty 8
  Filled 2021-10-31: qty 3
  Filled 2021-10-31: qty 8
  Filled 2021-10-31: qty 3
  Filled 2021-10-31: qty 8
  Filled 2021-10-31: qty 3
  Filled 2021-10-31 (×2): qty 8
  Filled 2021-10-31: qty 3

## 2021-10-31 MED ORDER — JUVEN PO PACK
1.0000 | PACK | Freq: Two times a day (BID) | ORAL | Status: DC
  Administered 2021-11-01 – 2021-11-07 (×12): 1 via ORAL

## 2021-10-31 MED ORDER — TRAZODONE HCL 50 MG PO TABS
50.0000 mg | ORAL_TABLET | Freq: Every day | ORAL | Status: DC
  Administered 2021-10-31 – 2021-11-06 (×7): 50 mg via ORAL
  Filled 2021-10-31 (×7): qty 1

## 2021-10-31 MED ORDER — ASCORBIC ACID 500 MG PO TABS
500.0000 mg | ORAL_TABLET | Freq: Two times a day (BID) | ORAL | Status: DC
Start: 2021-10-31 — End: 2021-11-07
  Administered 2021-10-31 – 2021-11-07 (×12): 500 mg via ORAL
  Filled 2021-10-31 (×13): qty 1

## 2021-10-31 MED ORDER — LISINOPRIL 20 MG PO TABS
20.0000 mg | ORAL_TABLET | Freq: Every day | ORAL | Status: DC
  Administered 2021-10-31 – 2021-11-07 (×6): 20 mg via ORAL
  Filled 2021-10-31 (×6): qty 1

## 2021-10-31 MED ORDER — ADULT MULTIVITAMIN W/MINERALS CH
1.0000 | ORAL_TABLET | Freq: Every day | ORAL | Status: DC
  Administered 2021-10-31 – 2021-11-07 (×6): 1 via ORAL
  Filled 2021-10-31 (×6): qty 1

## 2021-10-31 MED ORDER — ENSURE MAX PROTEIN PO LIQD
11.0000 [oz_av] | Freq: Every day | ORAL | Status: DC
Start: 2021-10-31 — End: 2021-11-07
  Administered 2021-10-31 – 2021-11-06 (×6): 11 [oz_av] via ORAL
  Filled 2021-10-31: qty 330

## 2021-10-31 MED ORDER — GABAPENTIN 300 MG PO CAPS
300.0000 mg | ORAL_CAPSULE | Freq: Two times a day (BID) | ORAL | Status: DC
  Administered 2021-10-31 – 2021-11-06 (×12): 300 mg via ORAL
  Filled 2021-10-31 (×12): qty 1

## 2021-10-31 MED ORDER — ENOXAPARIN SODIUM 40 MG/0.4ML IJ SOSY
40.0000 mg | PREFILLED_SYRINGE | INTRAMUSCULAR | Status: AC
  Administered 2021-10-31 – 2021-11-03 (×4): 40 mg via SUBCUTANEOUS
  Filled 2021-10-31 (×4): qty 0.4

## 2021-10-31 MED ORDER — SODIUM CHLORIDE 0.9 % IV SOLN
2.0000 g | Freq: Three times a day (TID) | INTRAVENOUS | Status: DC
  Filled 2021-10-31 (×2): qty 12.5

## 2021-10-31 MED ORDER — VANCOMYCIN HCL 1750 MG/350ML IV SOLN
1750.0000 mg | INTRAVENOUS | Status: DC
  Filled 2021-10-31: qty 350

## 2021-10-31 MED ORDER — MORPHINE SULFATE (PF) 2 MG/ML IV SOLN
2.0000 mg | INTRAVENOUS | Status: DC | PRN
  Administered 2021-10-31 – 2021-11-07 (×15): 2 mg via INTRAVENOUS
  Filled 2021-10-31 (×15): qty 1

## 2021-10-31 MED ORDER — PRAVASTATIN SODIUM 20 MG PO TABS
20.0000 mg | ORAL_TABLET | Freq: Every day | ORAL | Status: DC
  Administered 2021-10-31 – 2021-11-06 (×7): 20 mg via ORAL
  Filled 2021-10-31 (×7): qty 1

## 2021-10-31 NOTE — Progress Notes (Signed)
Inpatient Diabetes Program Recommendations ? ?AACE/ADA: New Consensus Statement on Inpatient Glycemic Control (2015) ? ?Target Ranges:  Prepandial:   less than 140 mg/dL ?     Peak postprandial:   less than 180 mg/dL (1-2 hours) ?     Critically ill patients:  140 - 180 mg/dL  ? ?Lab Results  ?Component Value Date  ? GLUCAP 152 (H) 10/31/2021  ? HGBA1C 11.6 (H) 08/12/2021  ? ? ?Review of Glycemic Control ? ?Diabetes history: DM 2 ?Outpatient Diabetes medications: Lantus 12-15 units bid ?Current orders for Inpatient glycemic control:  ?Levemir 20 units bid ?Novolog 0-20 units tid ? ?Last A1c 11.6% on 08/12/2021 ?Current A1c in process ? ?Inpatient Diabetes Program Recommendations:   ? ?Spoke with pt at bedside regarding glucose control at home. Pt reports following up with his doctor frequently and having several medication changes recently. Pt reports not checking his glucose but every couple of days. Pt reported taking Lantus 12-15 units bid at home. Discussed glucose trends in the hospital and told pt he was on a higher dose of Lantus here, 20 units. Encouraged pt to check glucose everyday at home. Encouraged pt to keep glucose tightly controlled for wound healing.  ? ?NOTE: Pt is mildly symptomatic at this time for hypoglycemia. Expressed to pt the need to push through some of the symptoms to get his body use to being at a normal glucose level while he is in the hospital a few days. ? ?Thanks, ? ?Christena Deem RN, MSN, BC-ADM ?Inpatient Diabetes Coordinator ?Team Pager [phone] (8a-5p) ? ? ?

## 2021-10-31 NOTE — Consult Note (Addendum)
NAME: Jesus Flowers  DOB: May 15, 1966  MRN: [id number]  Date/Time: 10/31/2021 1:39 PM  REQUESTING PROVIDER: Dr.Sreenath Subjective:  REASON FOR CONSULT: rt foot diabetic foot infection ? Jesus Flowers is a 56 y.o. 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 Was followed by Podiatrist as OP- on 4/3 when he went to see Dr.Mcdonald he found dehiscence at the surgical site, with necrosis and concern for Osteo. He took culture of the wound and asked him to get admitted Pt has no fever but had some chills HE does not work In the ED temp 98.6, BP 142/85, HR 79. WBC 10.7, HB 9.8, cr 1.22 and plat 338. Blood glucose 557. Blood culture sent MRI showed small abscesses, osteo of calcaneum. HE was started on vanco/cefepime and flagyl Podiatrist recommends BKA I am seeing him for antibiotic management   Past Medical History:  Diagnosis Date   Depression    Diabetes mellitus without complication (HCC)    High cholesterol     Past Surgical History:  Procedure Laterality Date   AMPUTATION Right 08/14/2021   Procedure: AMPUTATION FOOT;  Surgeon: Felecia Shelling, DPM;  Location: [facility] ORS;  Service: Podiatry;  Laterality: Right;   BACK SURGERY     INCISION AND DRAINAGE Right 08/14/2021   Procedure: INCISION AND DRAINAGE;  Surgeon: Felecia Shelling, DPM;  Location: [facility] ORS;  Service: Podiatry;  Laterality: Right;   IRRIGATION AND DEBRIDEMENT FOOT Right 05/06/2021   Procedure: IRRIGATION AND DEBRIDEMENT FOOT;  Surgeon: [name], DPM;  Location: [facility] ORS;  Service: Podiatry;  Laterality: Right;   TRANSMETATARSAL AMPUTATION Right 05/09/2021   Procedure: TRANSMETATARSAL AMPUTATION;  Surgeon: [name], DPM;  Location: [facility] ORS;  Service: Podiatry;  Laterality: Right;    Social History   Socioeconomic History    Marital status: Single    Spouse name: Not on file   Number of children: Not on file   Years of education: Not on file   Highest education level: Not on file  Occupational History   Not on file  Tobacco Use   Smoking status: Never   Smokeless tobacco: Never  Vaping Use   Vaping Use: Never used  Substance and Sexual Activity   Alcohol use: Yes   Drug use: Never   Sexual activity: Not on file  Other Topics Concern   Not on file  Social History Narrative   Not on file   Social Determinants of Health   Financial Resource Strain: Not on file  Food Insecurity: Not on file  Transportation Needs: Not on file  Physical Activity: Not on file  Stress: Not on file  Social Connections: Not on file  Intimate Partner Violence: Not on file    Family History  Problem Relation Age of Onset   Diabetes Mellitus II Sister    No Known Allergies I? Current Facility-Administered Medications  Medication Dose Route Frequency Provider Last Rate Last Admin   acetaminophen (TYLENOL) tablet 650 mg  650 mg Oral Q6H PRN Lurline Del, MD       Or   acetaminophen (TYLENOL) suppository 650 mg  650 mg Rectal Q6H PRN Lurline Del, MD       albuterol (PROVENTIL) (2.5 MG/3ML) 0.083% nebulizer solution 2.5 mg  2.5 mg Nebulization Q2H PRN Lurline Del, MD  ceFEPIme (MAXIPIME) 2 g in sodium chloride 0.9 % 100 mL IVPB  2 g Intravenous Q8H Hicks, Morgan L, RPH       enoxaparin (LOVENOX) injection 40 mg  40 mg Subcutaneous Q24H Sreenath, Sudheer B, MD       FLUoxetine (PROZAC) capsule 40 mg  40 mg Oral Daily Georgeann Oppenheim, Sudheer B, MD   40 mg at 10/31/21 1302   gabapentin (NEURONTIN) capsule 300 mg  300 mg Oral BID Lolita Patella B, MD   300 mg at 10/31/21 1301   insulin aspart (novoLOG) injection 0-20 Units  0-20 Units Subcutaneous TID WC Skip Mayer A, MD   4 Units at 10/31/21 1303   insulin detemir (LEVEMIR) injection 20 Units  20 Units Subcutaneous BID Skip Mayer A, MD    20 Units at 10/31/21 1023   lisinopril (ZESTRIL) tablet 20 mg  20 mg Oral Daily Sreenath, Sudheer B, MD   20 mg at 10/31/21 1302   metroNIDAZOLE (FLAGYL) tablet 500 mg  500 mg Oral Q12H Skip Mayer A, MD   500 mg at 10/31/21 0506   morphine (PF) 2 MG/ML injection 2 mg  2 mg Intravenous Q3H PRN Lolita Patella B, MD   2 mg at 10/31/21 1116   ondansetron (ZOFRAN) tablet 4 mg  4 mg Oral Q6H PRN Lurline Del, MD       Or   ondansetron [facility]) injection 4 mg  4 mg Intravenous Q6H PRN Lurline Del, MD       oxyCODONE (Oxy IR/ROXICODONE) immediate release tablet 5 mg  5 mg Oral Q4H PRN Lurline Del, MD   5 mg at 10/31/21 0831   pneumococcal 20-valent conjugate vaccine (PREVNAR 20) injection 0.5 mL  0.5 mL Intramuscular Prior to discharge Lurline Del, MD       pravastatin (PRAVACHOL) tablet 20 mg  20 mg Oral q1800 Sreenath, Sudheer B, MD       sodium chloride flush (NS) 0.9 % injection 3 mL  3 mL Intravenous Q12H Skip Mayer A, MD   3 mL at 10/31/21 1026   traZODone (DESYREL) tablet 50 mg  50 mg Oral QHS Lolita Patella B, MD       vancomycin (VANCOREADY) IVPB 1750 mg/350 mL  1,750 mg Intravenous Q24H Derrek Gu, RPH         Abtx:  Anti-infectives (From admission, onward)    Start     Dose/Rate Route Frequency Ordered Stop   10/31/21 1400  vancomycin (VANCOREADY) IVPB 1250 mg/250 mL  Status:  Discontinued        1,250 mg 166.7 mL/hr over 90 Minutes Intravenous Every 24 hours 10/30/21 1538 10/31/21 0951   10/31/21 1400  ceFEPIme (MAXIPIME) 2 g in sodium chloride 0.9 % 100 mL IVPB        2 g 200 mL/hr over 30 Minutes Intravenous Every 8 hours 10/31/21 0951     10/31/21 1400  vancomycin (VANCOREADY) IVPB 1750 mg/350 mL        1,750 mg 175 mL/hr over 120 Minutes Intravenous Every 24 hours 10/31/21 0951 11/06/21 1359   10/31/21 0000  ceFEPIme (MAXIPIME) 2 g in sodium chloride 0.9 % 100 mL IVPB  Status:  Discontinued        2 g 200 mL/hr over 30  Minutes Intravenous Every 12 hours 10/30/21 1538 10/31/21 0951   10/30/21 1700  metroNIDAZOLE (FLAGYL) tablet 500 mg        500 mg Oral Every 12 hours 10/30/21 1526  11/06/21 1659   10/30/21 1400  vancomycin (VANCOREADY) IVPB 750 mg/150 mL       See Hyperspace for full Linked Orders Report.   750 mg 150 mL/hr over 60 Minutes Intravenous  Once 10/30/21 1305 10/30/21 1515   10/30/21 1315  vancomycin (VANCOCIN) IVPB 1000 mg/200 mL premix  Status:  Discontinued       See Hyperspace for full Linked Orders Report.   1,000 mg 200 mL/hr over 60 Minutes Intravenous  Once 10/30/21 1200 10/30/21 1305   10/30/21 1215  ceFEPIme (MAXIPIME) 2 g in sodium chloride 0.9 % 100 mL IVPB        2 g 200 mL/hr over 30 Minutes Intravenous  Once 10/30/21 1200 10/30/21 1256   10/30/21 1200  vancomycin (VANCOCIN) IVPB 1000 mg/200 mL premix       See Hyperspace for full Linked Orders Report.   1,000 mg 200 mL/hr over 60 Minutes Intravenous  Once 10/30/21 1200 10/30/21 1330       REVIEW OF SYSTEMS:  Const: negative fever, +chills, negative weight loss Eyes: negative diplopia or visual changes, negative eye pain ENT: negative coryza, negative sore throat Resp: negative cough, hemoptysis, dyspnea Cards: negative for chest pain, palpitations, lower extremity edema GU: negative for frequency, dysuria and hematuria GI: Negative for abdominal pain, diarrhea, bleeding, constipation Skin: negative for rash and pruritus Heme: negative for easy bruising and gum/nose bleeding MS: negative for myalgias, arthralgias, back pain and muscle weakness Neurolo:negative for headaches, dizziness, vertigo, memory problems  Psych: negative for feelings of anxiety, depression  Endocrine: , diabetes Allergy/Immunology- negative for any medication or food allergies  Objective:  VITALS:  BP (!) 154/99 (BP Location: Right Arm)   Pulse 69   Temp 97.6 F (36.4 C)   Resp 16   Ht 5\' 6"  (1.676 m)   Wt 78.7 kg   SpO2 99%   BMI 28.00  kg/m  PHYSICAL EXAM:  General: Alert, cooperative, no distress, appears stated age.  Head: Normocephalic, without obvious abnormality, atraumatic. Eyes: Conjunctivae clear, anicteric sclerae. Pupils are equal ENT Nares normal. No drainage or sinus tenderness. Lips, mucosa, and tongue normal. No Thrush Neck: Supple, symmetrical, no adenopathy, thyroid: non tender no carotid bruit and no JVD. Back: No CVA tenderness. Lungs: Clear to auscultation bilaterally. No Wheezing or Rhonchi. No rales. Heart: Regular rate and rhythm, no murmur, rub or gallop. Abdomen: Soft, non-tender,not distended. Bowel sounds normal. No masses Extremities:rt foot- surgical stump has some dehiscence. Small wound.No erythema. Edema and bogginess     Skin: No rashes or lesions. Or bruising Lymph: Cervical, supraclavicular normal. Neurologic: Grossly non-focal Pertinent Labs Lab Results CBC    Component Value Date/Time   WBC 9.5 10/31/2021 0445   RBC 2.91 (L) 10/31/2021 0445   HGB 8.8 (L) 10/31/2021 0445   HCT 26.6 (L) 10/31/2021 0445   PLT 296 10/31/2021 0445   MCV 91.4 10/31/2021 0445   MCH 30.2 10/31/2021 0445   MCHC 33.1 10/31/2021 0445   RDW 12.0 10/31/2021 0445   LYMPHSABS 0.8 10/30/2021 1038   MONOABS 0.7 10/30/2021 1038   EOSABS 0.3 10/30/2021 1038   BASOSABS 0.0 10/30/2021 1038       Latest Ref Rng & Units 10/31/2021    4:45 AM 10/30/2021    6:39 PM 10/30/2021   10:38 AM  CMP  Glucose 70 - 99 mg/dL 098   119   147    BUN 6 - 20 mg/dL 30   35   42  Creatinine 0.61 - 1.24 mg/dL 2.13   0.86   5.78    Sodium 135 - 145 mmol/L 136   133   128    Potassium 3.5 - 5.1 mmol/L 4.4   4.5   5.2    Chloride 98 - 111 mmol/L 105   100   94    CO2 22 - 32 mmol/L 24   25   25     Calcium 8.9 - 10.3 mg/dL 8.4   8.5   8.7    Total Protein 6.5 - 8.1 g/dL 6.5    7.8    Total Bilirubin 0.3 - 1.2 mg/dL 0.5    0.5    Alkaline Phos 38 - 126 U/L 176    289    AST 15 - 41 U/L 15    27    ALT 0 - 44 U/L 23    34         Microbiology: Recent Results (from the past 240 hour(s))  WOUND CULTURE     Status: None (Preliminary result)   Collection Time: 10/29/21 11:25 AM  Result Value Ref Range Status   Gram Stain Result Final report  Final   Organism ID, Bacteria Comment  Final    Comment: No white blood cells seen.   Organism ID, Bacteria Comment  Final    Comment: Few gram positive cocci   Aerobic Bacterial Culture Preliminary report  Preliminary   Organism ID, Bacteria Routine flora  Preliminary    Comment: Light growth  Culture, blood (routine x 2)     Status: None (Preliminary result)   Collection Time: 10/30/21 12:09 PM   Specimen: BLOOD  Result Value Ref Range Status   Specimen Description BLOOD BLOOD RIGHT FOREARM  Final   Special Requests   Final    BOTTLES DRAWN AEROBIC AND ANAEROBIC Blood Culture adequate volume   Culture   Final    NO GROWTH < 24 HOURS Performed at [facility], [address] [place], [place] [address]    Report Status PENDING  Incomplete  Culture, blood (routine x 2)     Status: None (Preliminary result)   Collection Time: 10/30/21 12:09 PM   Specimen: BLOOD  Result Value Ref Range Status   Specimen Description BLOOD BLOOD RIGHT HAND  Final   Special Requests   Final    BOTTLES DRAWN AEROBIC AND ANAEROBIC Blood Culture adequate volume   Culture   Final    NO GROWTH < 24 HOURS Performed at [facility], [address], [place], [place] [address]    Report Status PENDING  Incomplete    IMAGING RESULTS: MRI rt foot  I have personally reviewed the films Peripherally enhancing fluid collections X 3 small < 3 cm each ? Impression/Recommendation ?Infection  of the rt foot amputation stump. S/p chparts amputation in Jan 2023. Dehiscence and abscess at the surgical site. Osteomyelitis of calcaneum Recent wound culture on 10/29/21 only skin flora  Pt is the past has had Group B strep infections Currently on vanco/cefepime and flagyl As  clinically stable with normal wbc and culture Ng  Can de-escalate antibiotics to unasyn. Awaiting BKA  DM- poorly controlled- hypergylcemia on presentation  Peripheral neuropathy ? Anemia?  AKI- resolved ___________________________________________________ Discussed with patient, requesting provider Note:  This document was prepared using Dragon voice recognition software and may include unintentional dictation errors.

## 2021-10-31 NOTE — Consult Note (Signed)
[facility] Vascular Consult Note  MRN : [id number]  Jesus Flowers is a 56 y.o. (02-07-1966) male who presents with chief complaint of  Chief Complaint  Patient presents with   Foot Pain  .   Consulting Physician: Dr. Sharl Ma, D.P.M. 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  This patient originally took placement in 2022 and has had difficulty with healing.  The patient had an office visit on 10/29/2021 and noted that the wound had worsened with swelling, draining redness and pain from this.  Subsequent x-ray and MRI show osteomyelitis of the calcaneum.  The patient underwent noninvasive studies on 10/31/2021 however it was noted that the right lower extremity ABIs were noncompressible with biphasic posterior tibial waveforms.  Current Facility-Administered Medications  Medication Dose Route Frequency Provider Last Rate Last Admin   acetaminophen (TYLENOL) tablet 650 mg  650 mg Oral Q6H PRN Lurline Del, MD       Or   acetaminophen (TYLENOL) suppository 650 mg  650 mg Rectal Q6H PRN Lurline Del, MD       albuterol (PROVENTIL) (2.5 MG/3ML) 0.083% nebulizer solution 2.5 mg  2.5 mg Nebulization Q2H PRN Lurline Del, MD       Ampicillin-Sulbactam (UNASYN) 3 g in sodium chloride 0.9 % 100 mL IVPB  3 g Intravenous Q6H Ravishankar, Rhodia Albright, MD 200 mL/hr at 10/31/21 2249 3 g at 10/31/21 2249   ascorbic acid (VITAMIN C) tablet 500 mg  500 mg Oral BID Lolita Patella B, MD   500 mg at 10/31/21 2243   enoxaparin (LOVENOX) injection 40 mg  40 mg Subcutaneous Q24H Lolita Patella B, MD   40 mg at 10/31/21 1754   FLUoxetine (PROZAC) capsule 40 mg  40 mg Oral Daily  Georgeann Oppenheim, Sudheer B, MD   40 mg at 10/31/21 1302   gabapentin (NEURONTIN) capsule 300 mg  300 mg Oral BID Lolita Patella B, MD   300 mg at 10/31/21 2243   insulin aspart (novoLOG) injection 0-20 Units  0-20 Units Subcutaneous TID WC Lurline Del, MD   11 Units at 10/31/21 1753   insulin detemir (LEVEMIR) injection 20 Units  20 Units Subcutaneous BID Lurline Del, MD   20 Units at 10/31/21 2244   lisinopril (ZESTRIL) tablet 20 mg  20 mg Oral Daily Georgeann Oppenheim, Sudheer B, MD   20 mg at 10/31/21 1302   morphine (PF) 2 MG/ML injection 2 mg  2 mg Intravenous Q3H PRN Lolita Patella B, MD   2 mg at 10/31/21 1116   multivitamin with minerals tablet 1 tablet  1 tablet Oral Daily Lolita Patella B, MD   1 tablet at 10/31/21 1753   [START ON 11/01/2021] nutrition supplement (JUVEN) (JUVEN) powder packet 1 packet  1 packet Oral BID BM Sreenath, Sudheer B, MD       ondansetron (ZOFRAN) tablet 4 mg  4 mg Oral Q6H PRN Lurline Del, MD       Or   ondansetron (ZOFRAN) injection 4 mg  4 mg Intravenous Q6H PRN Skip Mayer A, MD       oxyCODONE (Oxy IR/ROXICODONE) immediate release tablet 5 mg  5 mg Oral Q4H PRN Lurline Del, MD   5 mg at 10/31/21 2243  pneumococcal 20-valent conjugate vaccine (PREVNAR 20) injection 0.5 mL  0.5 mL Intramuscular Prior to discharge Lurline Del, MD       pravastatin (PRAVACHOL) tablet 20 mg  20 mg Oral q1800 Lolita Patella B, MD   20 mg at 10/31/21 1753   protein supplement (ENSURE MAX) liquid  11 oz Oral QHS Sreenath, Sudheer B, MD   11 oz at 10/31/21 2244   sodium chloride flush (NS) 0.9 % injection 3 mL  3 mL Intravenous Q12H Skip Mayer A, MD   3 mL at 10/31/21 1026   traZODone (DESYREL) tablet 50 mg  50 mg Oral QHS Lolita Patella B, MD   50 mg at 10/31/21 2243   zinc sulfate capsule 220 mg  220 mg Oral Daily Lolita Patella B, MD   220 mg at 10/31/21 1753    Past Medical History:  Diagnosis Date   Depression     Diabetes mellitus without complication (HCC)    High cholesterol     Past Surgical History:  Procedure Laterality Date   AMPUTATION Right 08/14/2021   Procedure: AMPUTATION FOOT;  Surgeon: Felecia Shelling, DPM;  Location: [facility];  Service: Podiatry;  Laterality: Right;   BACK SURGERY     INCISION AND DRAINAGE Right 08/14/2021   Procedure: INCISION AND DRAINAGE;  Surgeon: Felecia Shelling, DPM;  Location: [facility];  Service: Podiatry;  Laterality: Right;   IRRIGATION AND DEBRIDEMENT FOOT Right 05/06/2021   Procedure: IRRIGATION AND DEBRIDEMENT FOOT;  Surgeon: [name], DPM;  Location: [facility];  Service: Podiatry;  Laterality: Right;   TRANSMETATARSAL AMPUTATION Right 05/09/2021   Procedure: TRANSMETATARSAL AMPUTATION;  Surgeon: [name], DPM;  Location: [facility];  Service: Podiatry;  Laterality: Right;    Social History Social History   Tobacco Use   Smoking status: Never   Smokeless tobacco: Never  Vaping Use   Vaping Use: Never used  Substance Use Topics   Alcohol use: Yes   Drug use: Never    Family History Family History  Problem Relation Age of Onset   Diabetes Mellitus II Sister     No Known Allergies   REVIEW OF SYSTEMS (Negative unless checked)  Constitutional: [] Weight loss  [] Fever  [] Chills Cardiac: [] Chest pain   [] Chest pressure   [] Palpitations   [] Shortness of breath when laying flat   [] Shortness of breath at rest   [] Shortness of breath with exertion. Vascular:  [] Pain in legs with walking   [] Pain in legs at rest   [] Pain in legs when laying flat   [] Claudication   [] Pain in feet when walking  [] Pain in feet at rest  [] Pain in feet when laying flat   [] History of DVT   [] Phlebitis   [] Swelling in legs   [] Varicose veins   [] Non-healing ulcers Pulmonary:   [] Uses home oxygen   [] Productive cough   [] Hemoptysis   [] Wheeze  [] COPD   [] Asthma Neurologic:  [] Dizziness  [] Blackouts   [] Seizures   [] History of stroke   [] History of TIA  [] Aphasia    [] Temporary blindness   [] Dysphagia   [] Weakness or numbness in arms   [] Weakness or numbness in legs Musculoskeletal:  [] Arthritis   [] Joint swelling   [] Joint pain   [] Low back pain Hematologic:  [] Easy bruising  [] Easy bleeding   [] Hypercoagulable state   [] Anemic  [] Hepatitis Gastrointestinal:  [] Blood in stool   [] Vomiting blood  [] Gastroesophageal reflux/heartburn   [] Difficulty swallowing. Genitourinary:  [] Chronic kidney disease   []   Difficult urination  [] Frequent urination  [] Burning with urination   [] Blood in urine Skin:  [] Rashes   [x] Ulcers   [] Wounds Psychological:  [] History of anxiety   []  History of major depression.  Physical Examination  Vitals:   10/31/21 1145 10/31/21 1201 10/31/21 1717 10/31/21 2001  BP: 136/81 (!) 154/99 (!) 170/98 134/82  Pulse: 66 69 83 78  Resp: 15 16 16 16   Temp: 97.9 F (36.6 C) 97.6 F (36.4 C) 98.6 F (37 C) 98.1 F (36.7 C)  TempSrc: Oral     SpO2: 100% 99% 97% 98%  Weight:      Height:       Body mass index is 28 kg/m. Gen:  WD/WN, NAD Head: [place]/AT, No temporalis wasting. Prominent temp pulse not noted. Ear/Nose/Throat: Hearing grossly intact, nares w/o erythema or drainage, oropharynx w/o Erythema/Exudate Eyes: Sclera non-icteric, conjunctiva clear Neck: Trachea midline.  No JVD.  Pulmonary:  Good air movement, respirations not labored, equal bilaterally.  Cardiac: RRR, normal S1, S2. Vascular: Right lower extremity Chopart amputation with evidence of dehiscence and abscess.  Currently with foul-smelling drainage Vessel Right Left  PT Not Palpable Palpable  DP Not Palpable Palpable   Gastrointestinal: soft, non-tender/non-distended. No guarding/reflex.  Neurologic: Sensation grossly intact in extremities.  Symmetrical.  Speech is fluent. Motor exam as listed above. Psychiatric: Judgment intact, Mood & affect appropriate for pt's clinical situation. Lymph : No Cervical, Axillary, or Inguinal lymphadenopathy.    CBC Lab  Results  Component Value Date   WBC 9.5 10/31/2021   HGB 8.8 (L) 10/31/2021   HCT 26.6 (L) 10/31/2021   MCV 91.4 10/31/2021   PLT 296 10/31/2021    BMET    Component Value Date/Time   NA 136 10/31/2021 0445   K 4.4 10/31/2021 0445   CL 105 10/31/2021 0445   CO2 24 10/31/2021 0445   GLUCOSE 110 (H) 10/31/2021 0445   BUN 30 (H) 10/31/2021 0445   CREATININE 1.01 10/31/2021 0445   CALCIUM 8.4 (L) 10/31/2021 0445   GFRNONAA >60 10/31/2021 0445   Estimated Creatinine Clearance: 80.6 mL/min (by C-G formula based on SCr of 1.01 mg/dL).  COAG Lab Results  Component Value Date   INR 1.2 05/03/2021    Radiology MR ANKLE RIGHT W WO CONTRAST  Result Date: 10/30/2021 CLINICAL DATA:  Status post Chopart's amputation present with progressive right foot infection/cellulitis. EXAM: MRI OF THE RIGHT ANKLE WITHOUT AND WITH CONTRAST TECHNIQUE: Multiplanar, multisequence MR imaging of the ankle was performed before and after the administration of intravenous contrast. CONTRAST:  7mL GADAVIST GADOBUTROL 1 MMOL/ML IV SOLN COMPARISON:  None. FINDINGS: Bones: Status post prior 2 parts amputation. There is bone marrow edema about the talar head and neck. There is also edema of the anterior process of the calcaneus. Soft Tissue: There is a peripherally enhancing fluid collection adjacent to the anterior process of calcaneus measuring at least 2.0 x 1.7 x 1.7 cm with a sinus track extending anterior aspect of the stump. There is another pocket which extends from the above-mentioned collection to the anterior aspect of the calcaneus measuring 1.6 x 0.8 cm. There is another peripherally enhancing fluid collection about inferomedial aspect of the calcaneus measuring approximately 2.9 x 2.2 x 1.6 cm. Another peripherally enhancing fluid collection about the medial aspect of the prior amputation site measuring at least 2.3 x 1.0 by 2.6 cm. TENDONS Peroneal: Peroneus longus and brevis are intact with surrounding edema.  Posteromedial: Posterior tibial tendon intact. Flexor hallucis longus  tendon intact. Flexor digitorum longus tendon intact. Trace amount of fluid along the tibialis posterior and flexor digitorum. Anterior: Tibialis anterior tendon intact. Extensor hallucis longus tendon intact Extensor digitorum longus tendon intact. Achilles:  Intact. Plantar Fascia: Intact. Scratch third LIGAMENTS Lateral: Anterior talofibular ligament intact. Calcaneofibular ligament intact. Posterior talofibular ligament intact. Anterior and posterior tibiofibular ligaments intact. Medial: Deltoid ligament intact. Spring ligament intact. CARTILAGE Ankle Joint: Small joint effusion joint effusion. Normal ankle mortise. No chondral defect. Subtalar Joints/Sinus Tarsi: Normal subtalar joints. No subtalar joint effusion. IMPRESSION: IMPRESSION 1. At least three peripherally enhancing abscesses about the anterior and medial aspect of the ankle with sinus tracts with marked edema of the stump site and phlegmonous changes. Surgical consultation for further management is recommended. 2. Bone marrow edema of the anterior calcaneal process as well as talar head and neck consistent with acute osteomyelitis. 3. Skin thickening and generalized subcutaneous soft tissue edema as well as edema of the muscles of the foot concerning for myositis. Electronically Signed   By: Larose Hires D.O.   On: 10/30/2021 22:54   US ARTERIAL ABI (SCREENING LOWER EXTREMITY)  Result Date: 10/31/2021 CLINICAL DATA:  56 year old male with history of partial right foot amputation. EXAM: NONINVASIVE PHYSIOLOGIC VASCULAR STUDY OF BILATERAL LOWER EXTREMITIES TECHNIQUE: Evaluation of both lower extremities were performed at rest, including calculation of ankle-brachial indices with single level Doppler, pressure and pulse volume recording. COMPARISON:  None. FINDINGS: Right ABI:  Noncompressible. Left ABI:  1.31 Right Lower Extremity:  Biphasic posterior tibial waveforms. Left  Lower Extremity:  Biphasic arterial waveforms at the ankle. IMPRESSION: 1. Noncompressible right ankle brachial index. 2. Normal (1.31) left ankle brachial index. Consider lower extremity arterial duplex, CTA runoff, or direct angiography for further characterization. Marliss Coots, MD [facility] Radiology Electronically Signed   By: Marliss Coots M.D.   On: 10/31/2021 10:27   DG Foot Complete Right  Result Date: 10/30/2021 CLINICAL DATA:  History of partial right foot amputation few months ago. Pain and swelling. EXAM: RIGHT FOOT COMPLETE - 3+ VIEW COMPARISON:  08/14/2021 FINDINGS: Diffuse subcutaneous soft tissue swelling/edema noted around the hindfoot. There is a soft tissue defect containing gas medially which is likely an open wound. No definite destructive bony changes to suggest osteomyelitis. Extensive vascular calcifications. IMPRESSION: 1. Air containing medial soft tissue defect likely an open wound. 2. No definite plain film findings for osteomyelitis. 3. Diffuse subcutaneous soft tissue swelling/edema. Electronically Signed   By: Rudie Meyer M.D.   On: 10/30/2021 11:22      Assessment/Plan -ABIs done today show noncompressible ABIs but given extensive infection and osteomyelitis, with pending amputation it would be in the patient's best interest to proceed with a right lower extremity angiogram to ensure the patient has adequate blood flow for healing a below-knee amputation.  -We will plan on having the patient undergo angiogram on Friday, 11/02/2021 with below-knee amputation next week. -Discussed the risk, benefits and alternatives above the angiogram as well as below-knee amputation and patient is agreeable to proceed with both procedures. -Recommended glycemic control to help with wound healing in preparation for prosthetic post amputation  Family Communication: Adult son at bedside to participate in conversation   Georgiana Spinner,  NP [facility] [phone] (Office Phone) [phone] (Office Fax)  10/31/2021 11:08 PM    This note was created with Dragon medical transcription system.  Any error is purely unintentional

## 2021-10-31 NOTE — Progress Notes (Signed)
PROGRESS NOTE    Jesus Flowers  ZOX:[id number] DOB: 21-May-1966 DOA: 10/30/2021 PCP: [facility]    Brief Narrative:  56 y.o. 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 Patient then returns 07/2021 with foot infection and diagnosed with residual osteomyelitis at site of R foot TMA. 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 Patient now returns to ed after follow with podiatry 10/30/22 with diagnosis of "severe dehiscence and necrosis of wound and concern for osteomyelitis of the remaining talus and calcaneus, probable septic arthritis of  subtalar and as well as ankle." Per podiatry noted. Per podiatry will need BKA .Patient was referred to ED yesterday but presented today. Per patient he noted worsening pain s/p fall and injury 3-4 days ago.  He notes increase swelling drainage and odor. He notes associated chills but no fever, n/v/d. ON further ros no sob/ chest pain / cough / uri signs and symptoms. He notes he is complaint with is diabetic medication.  Podiatry consulted. Pending evaluation and recommendations   Assessment & Plan:   Principal Problem:   Diabetic foot infection (HCC)  Osteomyelitis right ankle Wound dehiscence with necrosis Complicated soft tissue infection Patient has a history of surgical amputations of the affected extremity Unfortunately wound is dehisced MRI with evidence of osteomyelitis Plan: Continue broad-spectrum IV antibiotics As needed pain control Pending podiatry follow-up  Diabetes mellitus type 2 with hyperglycemia Recheck hemoglobin A1c Basal bolus regimen Carb modified diet  Acute kidney injury Appears resolved Suspect prerenal azotemia Monitor kidney function  periodically  Hyponatremia Likely related to elevated blood sugars Resolved with administration of IVF Monitor labs  History of alcohol use No evidence of withdrawal No indication for CIWA protocol  Hyperlipidemia PTA statin  Depression PTA fluoxetine  Hypertension PTA lisinopril    DVT prophylaxis: SQ Lovenox Code Status: Full code Family Communication: None today.  Offered to call but patient declined Disposition Plan: Status is: Inpatient Remains inpatient appropriate because: Acute osteomyelitis associated with wound dehiscence right lower extremity.  Pending podiatry surgical follow-up   Level of care: Med-Surg  Consultants:  Podiatry  Procedures:  None  Antimicrobials: Vancomycin Cefepime   Subjective: Seen and examined.  Resting comfortably in bed.  No visible distress.  Endorses pain in right foot  Objective: Vitals:   10/31/21 0500 10/31/21 0533 10/31/21 0749 10/31/21 1029  BP:  (!) 152/93 (!) 160/97   Pulse:  71 77   Resp:  16 17 15   Temp:  97.6 F (36.4 C) 98.7 F (37.1 C)   TempSrc:   Oral   SpO2:  99% 99%   Weight: 78.7 kg     Height:        Intake/Output Summary (Last 24 hours) at 10/31/2021 1123 Last data filed at 10/31/2021 1026 Gross per 24 hour  Intake 3362.61 ml  Output 850 ml  Net 2512.61 ml   Filed Weights   10/30/21 1111 10/30/21 1301 10/31/21 0500  Weight: (!) 160 kg 72.6 kg 78.7 kg    Examination:  General exam: Appears calm and comfortable  Respiratory system: Clear to auscultation. Respiratory effort normal. Cardiovascular system: S1-S2, RRR, no murmurs, no pedal edema Gastrointestinal system: Soft, NT/ND, normal bowel sounds Central nervous system: Alert and oriented. No focal neurological deficits. Extremities: Right foot status post  TMA.  Surgical dressings.  Foul-smelling Skin: No rashes, lesions or ulcers Psychiatry: Judgement and insight appear normal. Mood & affect appropriate.     Data Reviewed: I have  personally reviewed following labs and imaging studies  CBC: Recent Labs  Lab 10/30/21 1038 10/30/21 1839 10/31/21 0445  WBC 11.8* 10.7* 9.5  NEUTROABS 9.9*  --   --   HGB 10.8* 9.8* 8.8*  HCT 32.2* 28.9* 26.6*  MCV 91.5 91.2 91.4  PLT 352 338 296   Basic Metabolic Panel: Recent Labs  Lab 10/30/21 1038 10/30/21 1839 10/31/21 0445  NA 128* 133* 136  K 5.2* 4.5 4.4  CL 94* 100 105  CO2 25 25 24   GLUCOSE 557* 271* 110*  BUN 42* 35* 30*  CREATININE 1.57* 1.22 1.01  CALCIUM 8.7* 8.5* 8.4*  MG  --  2.1  --   PHOS  --  3.0  --    GFR: Estimated Creatinine Clearance: 80.6 mL/min (by C-G formula based on SCr of 1.01 mg/dL). Liver Function Tests: Recent Labs  Lab 10/30/21 1038 10/31/21 0445  AST 27 15  ALT 34 23  ALKPHOS 289* 176*  BILITOT 0.5 0.5  PROT 7.8 6.5  ALBUMIN 2.8* 2.4*   No results for input(s): LIPASE, AMYLASE in the last 168 hours. No results for input(s): AMMONIA in the last 168 hours. Coagulation Profile: No results for input(s): INR, PROTIME in the last 168 hours. Cardiac Enzymes: No results for input(s): CKTOTAL, CKMB, CKMBINDEX, TROPONINI in the last 168 hours. BNP (last 3 results) No results for input(s): PROBNP in the last 8760 hours. HbA1C: No results for input(s): HGBA1C in the last 72 hours. CBG: Recent Labs  Lab 10/30/21 1630 10/30/21 2146 10/31/21 0759  GLUCAP 377* 80 122*   Lipid Profile: No results for input(s): CHOL, HDL, LDLCALC, TRIG, CHOLHDL, LDLDIRECT in the last 72 hours. Thyroid Function Tests: No results for input(s): TSH, T4TOTAL, FREET4, T3FREE, THYROIDAB in the last 72 hours. Anemia Panel: No results for input(s): VITAMINB12, FOLATE, FERRITIN, TIBC, IRON, RETICCTPCT in the last 72 hours. Sepsis Labs: Recent Labs  Lab 10/30/21 1038 10/30/21 1209  LATICACIDVEN 1.0 1.0    Recent Results (from the past 240 hour(s))  WOUND CULTURE     Status: None (Preliminary result)   Collection Time: 10/29/21 11:25 AM  Result  Value Ref Range Status   Gram Stain Result Final report  Final   Organism ID, Bacteria Comment  Final    Comment: No white blood cells seen.   Organism ID, Bacteria Comment  Final    Comment: Few gram positive cocci   Aerobic Bacterial Culture Preliminary report  Preliminary   Organism ID, Bacteria Routine flora  Preliminary    Comment: Light growth  Culture, blood (routine x 2)     Status: None (Preliminary result)   Collection Time: 10/30/21 12:09 PM   Specimen: BLOOD  Result Value Ref Range Status   Specimen Description BLOOD BLOOD RIGHT FOREARM  Final   Special Requests   Final    BOTTLES DRAWN AEROBIC AND ANAEROBIC Blood Culture adequate volume   Culture   Final    NO GROWTH < 24 HOURS Performed at [facility], [address] [place], [place] [address]    Report Status PENDING  Incomplete  Culture, blood (routine x 2)     Status: None (Preliminary result)   Collection Time: 10/30/21 12:09 PM   Specimen: BLOOD  Result Value Ref Range Status   Specimen Description BLOOD BLOOD  RIGHT HAND  Final   Special Requests   Final    BOTTLES DRAWN AEROBIC AND ANAEROBIC Blood Culture adequate volume   Culture   Final    NO GROWTH < 24 HOURS Performed at [facility], [address] [place], [place] [address]    Report Status PENDING  Incomplete         Radiology Studies: MR ANKLE RIGHT W WO CONTRAST  Result Date: 10/30/2021 CLINICAL DATA:  Status post Chopart's amputation present with progressive right foot infection/cellulitis. EXAM: MRI OF THE RIGHT ANKLE WITHOUT AND WITH CONTRAST TECHNIQUE: Multiplanar, multisequence MR imaging of the ankle was performed before and after the administration of intravenous contrast. CONTRAST:  7mL GADAVIST GADOBUTROL 1 MMOL/ML IV SOLN COMPARISON:  None. FINDINGS: Bones: Status post prior 2 parts amputation. There is bone marrow edema about the talar head and neck. There is also edema of the anterior process of the calcaneus.  Soft Tissue: There is a peripherally enhancing fluid collection adjacent to the anterior process of calcaneus measuring at least 2.0 x 1.7 x 1.7 cm with a sinus track extending anterior aspect of the stump. There is another pocket which extends from the above-mentioned collection to the anterior aspect of the calcaneus measuring 1.6 x 0.8 cm. There is another peripherally enhancing fluid collection about inferomedial aspect of the calcaneus measuring approximately 2.9 x 2.2 x 1.6 cm. Another peripherally enhancing fluid collection about the medial aspect of the prior amputation site measuring at least 2.3 x 1.0 by 2.6 cm. TENDONS Peroneal: Peroneus longus and brevis are intact with surrounding edema. Posteromedial: Posterior tibial tendon intact. Flexor hallucis longus tendon intact. Flexor digitorum longus tendon intact. Trace amount of fluid along the tibialis posterior and flexor digitorum. Anterior: Tibialis anterior tendon intact. Extensor hallucis longus tendon intact Extensor digitorum longus tendon intact. Achilles:  Intact. Plantar Fascia: Intact. Scratch third LIGAMENTS Lateral: Anterior talofibular ligament intact. Calcaneofibular ligament intact. Posterior talofibular ligament intact. Anterior and posterior tibiofibular ligaments intact. Medial: Deltoid ligament intact. Spring ligament intact. CARTILAGE Ankle Joint: Small joint effusion joint effusion. Normal ankle mortise. No chondral defect. Subtalar Joints/Sinus Tarsi: Normal subtalar joints. No subtalar joint effusion. IMPRESSION: IMPRESSION 1. At least three peripherally enhancing abscesses about the anterior and medial aspect of the ankle with sinus tracts with marked edema of the stump site and phlegmonous changes. Surgical consultation for further management is recommended. 2. Bone marrow edema of the anterior calcaneal process as well as talar head and neck consistent with acute osteomyelitis. 3. Skin thickening and generalized subcutaneous soft  tissue edema as well as edema of the muscles of the foot concerning for myositis. Electronically Signed   By: Larose Hires D.O.   On: 10/30/2021 22:54   US ARTERIAL ABI (SCREENING LOWER EXTREMITY)  Result Date: 10/31/2021 CLINICAL DATA:  56 year old male with history of partial right foot amputation. EXAM: NONINVASIVE PHYSIOLOGIC VASCULAR STUDY OF BILATERAL LOWER EXTREMITIES TECHNIQUE: Evaluation of both lower extremities were performed at rest, including calculation of ankle-brachial indices with single level Doppler, pressure and pulse volume recording. COMPARISON:  None. FINDINGS: Right ABI:  Noncompressible. Left ABI:  1.31 Right Lower Extremity:  Biphasic posterior tibial waveforms. Left Lower Extremity:  Biphasic arterial waveforms at the ankle. IMPRESSION: 1. Noncompressible right ankle brachial index. 2. Normal (1.31) left ankle brachial index. Consider lower extremity arterial duplex, CTA runoff, or direct angiography for further characterization. Marliss Coots, MD [facility] Electronically Signed   By: Jae Dire.D.  On: 10/31/2021 10:27   DG Foot Complete Right  Result Date: 10/30/2021 CLINICAL DATA:  History of partial right foot amputation few months ago. Pain and swelling. EXAM: RIGHT FOOT COMPLETE - 3+ VIEW COMPARISON:  08/14/2021 FINDINGS: Diffuse subcutaneous soft tissue swelling/edema noted around the hindfoot. There is a soft tissue defect containing gas medially which is likely an open wound. No definite destructive bony changes to suggest osteomyelitis. Extensive vascular calcifications. IMPRESSION: 1. Air containing medial soft tissue defect likely an open wound. 2. No definite plain film findings for osteomyelitis. 3. Diffuse subcutaneous soft tissue swelling/edema. Electronically Signed   By: Rudie Meyer M.D.   On: 10/30/2021 11:22        Scheduled Meds:  heparin  5,000 Units Subcutaneous Q8H   insulin aspart   0-20 Units Subcutaneous TID WC   insulin detemir  20 Units Subcutaneous BID   metroNIDAZOLE  500 mg Oral Q12H   sodium chloride flush  3 mL Intravenous Q12H   Continuous Infusions:  ceFEPime (MAXIPIME) IV     vancomycin       LOS: 1 day     Tresa Moore, MD [facility]   If 7PM-7AM, please contact night-coverage  10/31/2021, 11:23 AM

## 2021-10-31 NOTE — Progress Notes (Deleted)
Pt back in room from ultrasound.

## 2021-10-31 NOTE — H&P (View-Only) (Signed)
[facility] Vascular Consult Note  MRN : [id number]  Jesus Flowers is a 56 y.o. (02-07-1966) male who presents with chief complaint of  Chief Complaint  Patient presents with   Foot Pain  .   Consulting Physician: Dr. Sharl Ma, D.P.M. 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  This patient originally took placement in 2022 and has had difficulty with healing.  The patient had an office visit on 10/29/2021 and noted that the wound had worsened with swelling, draining redness and pain from this.  Subsequent x-ray and MRI show osteomyelitis of the calcaneum.  The patient underwent noninvasive studies on 10/31/2021 however it was noted that the right lower extremity ABIs were noncompressible with biphasic posterior tibial waveforms.  Current Facility-Administered Medications  Medication Dose Route Frequency Provider Last Rate Last Admin   acetaminophen (TYLENOL) tablet 650 mg  650 mg Oral Q6H PRN Lurline Del, MD       Or   acetaminophen (TYLENOL) suppository 650 mg  650 mg Rectal Q6H PRN Lurline Del, MD       albuterol (PROVENTIL) (2.5 MG/3ML) 0.083% nebulizer solution 2.5 mg  2.5 mg Nebulization Q2H PRN Lurline Del, MD       Ampicillin-Sulbactam (UNASYN) 3 g in sodium chloride 0.9 % 100 mL IVPB  3 g Intravenous Q6H Ravishankar, Rhodia Albright, MD 200 mL/hr at 10/31/21 2249 3 g at 10/31/21 2249   ascorbic acid (VITAMIN C) tablet 500 mg  500 mg Oral BID Lolita Patella B, MD   500 mg at 10/31/21 2243   enoxaparin (LOVENOX) injection 40 mg  40 mg Subcutaneous Q24H Lolita Patella B, MD   40 mg at 10/31/21 1754   FLUoxetine (PROZAC) capsule 40 mg  40 mg Oral Daily  Georgeann Oppenheim, Sudheer B, MD   40 mg at 10/31/21 1302   gabapentin (NEURONTIN) capsule 300 mg  300 mg Oral BID Lolita Patella B, MD   300 mg at 10/31/21 2243   insulin aspart (novoLOG) injection 0-20 Units  0-20 Units Subcutaneous TID WC Lurline Del, MD   11 Units at 10/31/21 1753   insulin detemir (LEVEMIR) injection 20 Units  20 Units Subcutaneous BID Lurline Del, MD   20 Units at 10/31/21 2244   lisinopril (ZESTRIL) tablet 20 mg  20 mg Oral Daily Georgeann Oppenheim, Sudheer B, MD   20 mg at 10/31/21 1302   morphine (PF) 2 MG/ML injection 2 mg  2 mg Intravenous Q3H PRN Lolita Patella B, MD   2 mg at 10/31/21 1116   multivitamin with minerals tablet 1 tablet  1 tablet Oral Daily Lolita Patella B, MD   1 tablet at 10/31/21 1753   [START ON 11/01/2021] nutrition supplement (JUVEN) (JUVEN) powder packet 1 packet  1 packet Oral BID BM Sreenath, Sudheer B, MD       ondansetron (ZOFRAN) tablet 4 mg  4 mg Oral Q6H PRN Lurline Del, MD       Or   ondansetron (ZOFRAN) injection 4 mg  4 mg Intravenous Q6H PRN Skip Mayer A, MD       oxyCODONE (Oxy IR/ROXICODONE) immediate release tablet 5 mg  5 mg Oral Q4H PRN Lurline Del, MD   5 mg at 10/31/21 2243  pneumococcal 20-valent conjugate vaccine (PREVNAR 20) injection 0.5 mL  0.5 mL Intramuscular Prior to discharge Lurline Del, MD       pravastatin (PRAVACHOL) tablet 20 mg  20 mg Oral q1800 Lolita Patella B, MD   20 mg at 10/31/21 1753   protein supplement (ENSURE MAX) liquid  11 oz Oral QHS Sreenath, Sudheer B, MD   11 oz at 10/31/21 2244   sodium chloride flush (NS) 0.9 % injection 3 mL  3 mL Intravenous Q12H Skip Mayer A, MD   3 mL at 10/31/21 1026   traZODone (DESYREL) tablet 50 mg  50 mg Oral QHS Lolita Patella B, MD   50 mg at 10/31/21 2243   zinc sulfate capsule 220 mg  220 mg Oral Daily Lolita Patella B, MD   220 mg at 10/31/21 1753    Past Medical History:  Diagnosis Date   Depression     Diabetes mellitus without complication (HCC)    High cholesterol     Past Surgical History:  Procedure Laterality Date   AMPUTATION Right 08/14/2021   Procedure: AMPUTATION FOOT;  Surgeon: Felecia Shelling, DPM;  Location: [facility] ORS;  Service: Podiatry;  Laterality: Right;   BACK SURGERY     INCISION AND DRAINAGE Right 08/14/2021   Procedure: INCISION AND DRAINAGE;  Surgeon: Felecia Shelling, DPM;  Location: [facility] OR[facility];  Service: Podiatry;  Laterality: Right;   IRRIGATION AND DEBRIDEMENT FOOT Right 05/06/2021   Procedure: IRRIGATION AND DEBRIDEMENT FOOT;  Surgeon: [name], DPM;  Location: [facility] OR[facility];  Service: Podiatry;  Laterality: Right;   TRANSMETATARSAL AMPUTATION Right 05/09/2021   Procedure: TRANSMETATARSAL AMPUTATION;  Surgeon: [name], DPM;  Location: [facility] OR[facility];  Service: Podiatry;  Laterality: Right;    Social History Social History   Tobacco Use   Smoking status: Never   Smokeless tobacco: Never  Vaping Use   Vaping Use: Never used  Substance Use Topics   Alcohol use: Yes   Drug use: Never    Family History Family History  Problem Relation Age of Onset   Diabetes Mellitus II Sister     No Known Allergies   REVIEW OF SYSTEMS (Negative unless checked)  Constitutional: [] Weight loss  [] Fever  [] Chills Cardiac: [] Chest pain   [] Chest pressure   [] Palpitations   [] Shortness of breath when laying flat   [] Shortness of breath at rest   [] Shortness of breath with exertion. Vascular:  [] Pain in legs with walking   [] Pain in legs at rest   [] Pain in legs when laying flat   [] Claudication   [] Pain in feet when walking  [] Pain in feet at rest  [] Pain in feet when laying flat   [] History of DVT   [] Phlebitis   [] Swelling in legs   [] Varicose veins   [] Non-healing ulcers Pulmonary:   [] Uses home oxygen   [] Productive cough   [] Hemoptysis   [] Wheeze  [] COPD   [] Asthma Neurologic:  [] Dizziness  [] Blackouts   [] Seizures   [] History of stroke   [] History of TIA  [] Aphasia    [] Temporary blindness   [] Dysphagia   [] Weakness or numbness in arms   [] Weakness or numbness in legs Musculoskeletal:  [] Arthritis   [] Joint swelling   [] Joint pain   [] Low back pain Hematologic:  [] Easy bruising  [] Easy bleeding   [] Hypercoagulable state   [] Anemic  [] Hepatitis Gastrointestinal:  [] Blood in stool   [] Vomiting blood  [] Gastroesophageal reflux/heartburn   [] Difficulty swallowing. Genitourinary:  [] Chronic kidney disease   []   Difficult urination  [] Frequent urination  [] Burning with urination   [] Blood in urine Skin:  [] Rashes   [x] Ulcers   [] Wounds Psychological:  [] History of anxiety   []  History of major depression.  Physical Examination  Vitals:   10/31/21 1145 10/31/21 1201 10/31/21 1717 10/31/21 2001  BP: 136/81 (!) 154/99 (!) 170/98 134/82  Pulse: 66 69 83 78  Resp: 15 16 16 16   Temp: 97.9 F (36.6 C) 97.6 F (36.4 C) 98.6 F (37 C) 98.1 F (36.7 C)  TempSrc: Oral     SpO2: 100% 99% 97% 98%  Weight:      Height:       Body mass index is 28 kg/m. Gen:  WD/WN, NAD Head: [place]/AT, No temporalis wasting. Prominent temp pulse not noted. Ear/Nose/Throat: Hearing grossly intact, nares w/o erythema or drainage, oropharynx w/o Erythema/Exudate Eyes: Sclera non-icteric, conjunctiva clear Neck: Trachea midline.  No JVD.  Pulmonary:  Good air movement, respirations not labored, equal bilaterally.  Cardiac: RRR, normal S1, S2. Vascular: Right lower extremity Chopart amputation with evidence of dehiscence and abscess.  Currently with foul-smelling drainage Vessel Right Left  PT Not Palpable Palpable  DP Not Palpable Palpable   Gastrointestinal: soft, non-tender/non-distended. No guarding/reflex.  Neurologic: Sensation grossly intact in extremities.  Symmetrical.  Speech is fluent. Motor exam as listed above. Psychiatric: Judgment intact, Mood & affect appropriate for pt's clinical situation. Lymph : No Cervical, Axillary, or Inguinal lymphadenopathy.    CBC Lab  Results  Component Value Date   WBC 9.5 10/31/2021   HGB 8.8 (L) 10/31/2021   HCT 26.6 (L) 10/31/2021   MCV 91.4 10/31/2021   PLT 296 10/31/2021    BMET    Component Value Date/Time   NA 136 10/31/2021 0445   K 4.4 10/31/2021 0445   CL 105 10/31/2021 0445   CO2 24 10/31/2021 0445   GLUCOSE 110 (H) 10/31/2021 0445   BUN 30 (H) 10/31/2021 0445   CREATININE 1.01 10/31/2021 0445   CALCIUM 8.4 (L) 10/31/2021 0445   GFRNONAA >60 10/31/2021 0445   Estimated Creatinine Clearance: 80.6 mL/min (by C-G formula based on SCr of 1.01 mg/dL).  COAG Lab Results  Component Value Date   INR 1.2 05/03/2021    Radiology MR ANKLE RIGHT W WO CONTRAST  Result Date: 10/30/2021 CLINICAL DATA:  Status post Chopart's amputation present with progressive right foot infection/cellulitis. EXAM: MRI OF THE RIGHT ANKLE WITHOUT AND WITH CONTRAST TECHNIQUE: Multiplanar, multisequence MR imaging of the ankle was performed before and after the administration of intravenous contrast. CONTRAST:  7mL GADAVIST GADOBUTROL 1 MMOL/ML IV SOLN COMPARISON:  None. FINDINGS: Bones: Status post prior 2 parts amputation. There is bone marrow edema about the talar head and neck. There is also edema of the anterior process of the calcaneus. Soft Tissue: There is a peripherally enhancing fluid collection adjacent to the anterior process of calcaneus measuring at least 2.0 x 1.7 x 1.7 cm with a sinus track extending anterior aspect of the stump. There is another pocket which extends from the above-mentioned collection to the anterior aspect of the calcaneus measuring 1.6 x 0.8 cm. There is another peripherally enhancing fluid collection about inferomedial aspect of the calcaneus measuring approximately 2.9 x 2.2 x 1.6 cm. Another peripherally enhancing fluid collection about the medial aspect of the prior amputation site measuring at least 2.3 x 1.0 by 2.6 cm. TENDONS Peroneal: Peroneus longus and brevis are intact with surrounding edema.  Posteromedial: Posterior tibial tendon intact. Flexor hallucis longus  tendon intact. Flexor digitorum longus tendon intact. Trace amount of fluid along the tibialis posterior and flexor digitorum. Anterior: Tibialis anterior tendon intact. Extensor hallucis longus tendon intact Extensor digitorum longus tendon intact. Achilles:  Intact. Plantar Fascia: Intact. Scratch third LIGAMENTS Lateral: Anterior talofibular ligament intact. Calcaneofibular ligament intact. Posterior talofibular ligament intact. Anterior and posterior tibiofibular ligaments intact. Medial: Deltoid ligament intact. Spring ligament intact. CARTILAGE Ankle Joint: Small joint effusion joint effusion. Normal ankle mortise. No chondral defect. Subtalar Joints/Sinus Tarsi: Normal subtalar joints. No subtalar joint effusion. IMPRESSION: IMPRESSION 1. At least three peripherally enhancing abscesses about the anterior and medial aspect of the ankle with sinus tracts with marked edema of the stump site and phlegmonous changes. Surgical consultation for further management is recommended. 2. Bone marrow edema of the anterior calcaneal process as well as talar head and neck consistent with acute osteomyelitis. 3. Skin thickening and generalized subcutaneous soft tissue edema as well as edema of the muscles of the foot concerning for myositis. Electronically Signed   By: Larose Hires D.O.   On: 10/30/2021 22:54   US ARTERIAL ABI (SCREENING LOWER EXTREMITY)  Result Date: 10/31/2021 CLINICAL DATA:  56 year old male with history of partial right foot amputation. EXAM: NONINVASIVE PHYSIOLOGIC VASCULAR STUDY OF BILATERAL LOWER EXTREMITIES TECHNIQUE: Evaluation of both lower extremities were performed at rest, including calculation of ankle-brachial indices with single level Doppler, pressure and pulse volume recording. COMPARISON:  None. FINDINGS: Right ABI:  Noncompressible. Left ABI:  1.31 Right Lower Extremity:  Biphasic posterior tibial waveforms. Left  Lower Extremity:  Biphasic arterial waveforms at the ankle. IMPRESSION: 1. Noncompressible right ankle brachial index. 2. Normal (1.31) left ankle brachial index. Consider lower extremity arterial duplex, CTA runoff, or direct angiography for further characterization. Marliss Coots, MD [facility] Radiology Electronically Signed   By: Marliss Coots M.D.   On: 10/31/2021 10:27   DG Foot Complete Right  Result Date: 10/30/2021 CLINICAL DATA:  History of partial right foot amputation few months ago. Pain and swelling. EXAM: RIGHT FOOT COMPLETE - 3+ VIEW COMPARISON:  08/14/2021 FINDINGS: Diffuse subcutaneous soft tissue swelling/edema noted around the hindfoot. There is a soft tissue defect containing gas medially which is likely an open wound. No definite destructive bony changes to suggest osteomyelitis. Extensive vascular calcifications. IMPRESSION: 1. Air containing medial soft tissue defect likely an open wound. 2. No definite plain film findings for osteomyelitis. 3. Diffuse subcutaneous soft tissue swelling/edema. Electronically Signed   By: Rudie Meyer M.D.   On: 10/30/2021 11:22      Assessment/Plan -ABIs done today show noncompressible ABIs but given extensive infection and osteomyelitis, with pending amputation it would be in the patient's best interest to proceed with a right lower extremity angiogram to ensure the patient has adequate blood flow for healing a below-knee amputation.  -We will plan on having the patient undergo angiogram on Friday, 11/02/2021 with below-knee amputation next week. -Discussed the risk, benefits and alternatives above the angiogram as well as below-knee amputation and patient is agreeable to proceed with both procedures. -Recommended glycemic control to help with wound healing in preparation for prosthetic post amputation  Family Communication: Adult son at bedside to participate in conversation   Georgiana Spinner,  NP [facility] [phone] (Office Phone) [phone] (Office Fax)  10/31/2021 11:08 PM    This note was created with Dragon medical transcription system.  Any error is purely unintentional

## 2021-10-31 NOTE — Progress Notes (Signed)
Pharmacy Antibiotic Note ? ?Jesus Flowers is a 56 y.o. 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 Pharmacy has been consulted for vancomycin and cefepime dosing for suspected diabetic foot infection. ? ?Plan: ?Cefepime 2 g IV Q8H ?Vancomycin 1750 mg IV Q24H ?Est AUC: 471 ?Cmin: 9.2 ?Used: Scr 1.01, IBW, Vd 0.72 ?Obtain vanc levels around 4th or 5th dose if continued ?Monitor renal function and adjust dose as clinically indicated ? ?Height: 5\' 6"  (167.6 cm) ?Weight: 78.7 kg (173 lb 8 oz) ?IBW/kg (Calculated) : 63.8 ? ?Temp (24hrs), Avg:98.3 ?F (36.8 ?C), Min:97.6 ?F (36.4 ?C), Max:99.2 ?F (37.3 ?C) ? ?Recent Labs  ?Lab 10/30/21 ?1038 10/30/21 ?1209 10/30/21 ?1839 10/31/21 ?[id number]  ?WBC 11.8*  --  10.7* 9.5  ?CREATININE 1.57*  --  1.22 1.01  ?LATICACIDVEN 1.0 1.0  --   --   ? ?  ?Estimated Creatinine Clearance: 80.6 mL/min (by C-G formula based on SCr of 1.01 mg/dL).   ? ?No Known Allergies ? ?Antimicrobials this admission: ?4/4 metronidazole >>  ?4/4 cefepime >>  ?4/4 vancomycin >>  ? ? ?Microbiology results: ?4/4 BCx: NG < 24h ? ?Thank you for allowing pharmacy to be a part of this patient?s care. ? ?Darnelle Bos, PharmD ?10/31/2021 9:48 AM ? ?

## 2021-10-31 NOTE — Progress Notes (Cosign Needed)
Pt gone to Radiology for ultrasound. ?

## 2021-10-31 NOTE — Plan of Care (Signed)
?  Problem: Education: ?Goal: Knowledge of General Education information will improve ?Description: Including pain rating scale, medication(s)/side effects and non-pharmacologic comfort measures ?Outcome: Progressing ?  ?Problem: Health Behavior/Discharge Planning: ?Goal: Ability to manage health-related needs will improve ?Outcome: Progressing ?  ?Problem: Clinical Measurements: ?Goal: Ability to maintain clinical measurements within normal limits will improve ?Outcome: Progressing ?Goal: Diagnostic test results will improve ?Outcome: Progressing ?Goal: Respiratory complications will improve ?Outcome: Progressing ?Goal: Cardiovascular complication will be avoided ?Outcome: Progressing ?  ?Problem: Activity: ?Goal: Risk for activity intolerance will decrease ?Outcome: Progressing ?  ?Problem: Nutrition: ?Goal: Adequate nutrition will be maintained ?Outcome: Progressing ?  ?Problem: Coping: ?Goal: Level of anxiety will decrease ?Outcome: Progressing ?  ?Problem: Elimination: ?Goal: Will not experience complications related to bowel motility ?Outcome: Progressing ?Goal: Will not experience complications related to urinary retention ?Outcome: Progressing ?  ?Problem: Pain Managment: ?Goal: General experience of comfort will improve ?Outcome: Progressing ?  ?Problem: Safety: ?Goal: Ability to remain free from injury will improve ?Outcome: Progressing ?  ?Problem: Education: ?Goal: Ability to describe self-care measures that may prevent or decrease complications (Diabetes Survival Skills Education) will improve ?Outcome: Progressing ?Goal: Individualized Educational Video(s) ?Outcome: Progressing ?  ?Problem: Coping: ?Goal: Ability to adjust to condition or change in health will improve ?Outcome: Progressing ?  ?Problem: Fluid Volume: ?Goal: Ability to maintain a balanced intake and output will improve ?Outcome: Progressing ?  ?Problem: Health Behavior/Discharge Planning: ?Goal: Ability to identify and utilize available  resources and services will improve ?Outcome: Progressing ?Goal: Ability to manage health-related needs will improve ?Outcome: Progressing ?  ?Problem: Metabolic: ?Goal: Ability to maintain appropriate glucose levels will improve ?Outcome: Progressing ?  ?Problem: Nutritional: ?Goal: Maintenance of adequate nutrition will improve ?Outcome: Progressing ?Goal: Progress toward achieving an optimal weight will improve ?Outcome: Progressing ?  ?Problem: Tissue Perfusion: ?Goal: Adequacy of tissue perfusion will improve ?Outcome: Progressing ?  ?Problem: Clinical Measurements: ?Goal: Will remain free from infection ?Outcome: Not Progressing ? Admitting diagnosis of infection  ?Problem: Skin Integrity: ?Goal: Risk for impaired skin integrity will decrease ?Outcome: Not Progressing ? Patient admitted for diabetic foot infection ?Problem: Skin Integrity: ?Goal: Risk for impaired skin integrity will decrease ?Outcome: Not Progressing ? Patient admitted for diabetic foot infection ?

## 2021-10-31 NOTE — Progress Notes (Signed)
Initial Nutrition Assessment ? ?DOCUMENTATION CODES:  ? ?Not applicable ? ?INTERVENTION:  ? ?-MVI with minerals daily ?-500 mg vitamin C BID ?-220 mg zinc sulfate daily x 14 days ?-1 packet Juven BID, each packet provides 95 calories, 2.5 grams of protein (collagen), and 9.8 grams of carbohydrate (3 grams sugar); also contains 7 grams of L-arginine and L-glutamine, 300 mg vitamin C, 15 mg vitamin E, 1.2 mcg vitamin B-12, 9.5 mg zinc, 200 mg calcium, and 1.5 g  Calcium Beta-hydroxy-Beta-methylbutyrate to support wound healing  ?-Ensure Max po daily, each supplement provides 150 kcal and 30 grams of protein.   ? ?NUTRITION DIAGNOSIS:  ? ?Increased nutrient needs related to wound healing as evidenced by estimated needs. ? ?GOAL:  ? ?Patient will meet greater than or equal to 90% of their needs ? ?MONITOR:  ? ?PO intake, Supplement acceptance, Diet advancement, Labs, Weight trends, Skin, I & O's ? ?REASON FOR ASSESSMENT:  ? ?Consult ?Assessment of nutrition requirement/status, Wound healing ? ?ASSESSMENT:  ? ?Jesus Flowers is a 56 y.o. 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 Patient then returns 07/2021 with foot infection and diagnosed with residual osteomyelitis at site of R foot TMA. 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 Patient now returns to ed after follow with podiatry 10/30/22 with diagnosis of "severe dehiscence and necrosis of wound and concern for osteomyelitis of the remaining talus and calcaneus, probable septic arthritis of  subtalar and as well as ankle." Per podiatry noted. Per podiatry will need BKA .Patient was referred to ED yesterday but presented today. Per patient he noted worsening pain s/p fall and injury 3-4 days ago.  He notes increase  swelling drainage and odor. He notes associated chills but no fever, n/v/d. ON further ros no sob/ chest pain / cough / uri signs and symptoms. He notes he is complaint with is diabetic medication. ? ?Pt admitted with rt ankle osteomyelitis  ? ?Reviewed I/O's: +2.1 L x 24 hours ? ?UOP: 850 ml x 24 hours ? ?Spoke with pt at bedside, who was pleasant and in good spirits today. He reports good appetite, consuming 100% of meals. PTA, pt reports variable appetite, consuming 2 meals per day (Breakfast: eggs, toast, and coffee; Lunch: steak and beans; DInner: fruit). Pt drinks mostly water and coffee.  ? ?Pt reports his UBW is around 150#. He admits to weight gain, as he recently visited his family in [place].  ? ?Pt reports variable glycemic control at home- varying from 130-300#. He is compliant with his medications. Pt very tearful at time of visit due to possibility of amputation. Emotional support given.  ? ?Discussed importance of good meal and supplement intake to promote healing. Pt amenable to supplements. Discussed importance of continued good glycemic control to support post-op healing.  ? ?Lab Results  ?Component Value Date  ? HGBA1C 11.6 (H) 08/12/2021  ? PTA DM medications are 50-1000 mg janumet BID, 25 mg jardiance daily and 15 units insulin glargine daily.  ? ?Labs reviewed: CBGS: 80-152 (inpatient orders for glycemic control are 0-20 units insulin aspart TID with meals and 20 units insulin detemir BID).   ? ?NUTRITION - FOCUSED PHYSICAL EXAM: ? ?Flowsheet Row Most Recent Value  ?Orbital Region No depletion  ?Upper Arm Region No depletion  ?Thoracic and  Lumbar Region No depletion  ?Buccal Region No depletion  ?Temple Region No depletion  ?Clavicle Bone Region No depletion  ?Clavicle and Acromion Bone Region No depletion  ?Scapular Bone Region No depletion  ?Dorsal Hand No depletion  ?Patellar Region No depletion  ?Anterior Thigh Region No depletion  ?Posterior Calf Region No depletion  ?Edema (RD  Assessment) None  ?Hair Reviewed  ?Eyes Reviewed  ?Mouth Reviewed  ?Skin Reviewed  ?Nails Reviewed  ? ?  ? ? ?Diet Order:   ?Diet Order   ? ?       ?  Diet heart healthy/carb modified Room service appropriate? Yes; Fluid consistency: Thin  Diet effective now       ?  ? ?  ?  ? ?  ? ? ?EDUCATION NEEDS:  ? ?Education needs have been addressed ? ?Skin:  Skin Assessment: Reviewed RN Assessment ? ?Last BM:  10/31/21 ? ?Height:  ? ?Ht Readings from Last 1 Encounters:  ?10/30/21 5\' 6"  (1.676 m)  ? ? ?Weight:  ? ?Wt Readings from Last 1 Encounters:  ?10/31/21 78.7 kg  ? ? ?Ideal Body Weight:  64.5 kg ? ?BMI:  Body mass index is 28 kg/m?. ? ?Estimated Nutritional Needs:  ? ?Kcal:  1750-1950 ? ?Protein:  90-105 grams ? ?Fluid:  > 1.7 L ? ? ? ?12/31/21, RD, LDN, CDCES ?Registered Dietitian II ?Certified Diabetes Care and Education Specialist ?Please refer to [facility] for RD and/or RD on-call/weekend/after hours pager  ?

## 2021-11-01 LAB — GLUCOSE, CAPILLARY
Glucose-Capillary: 122 mg/dL — ABNORMAL HIGH (ref 70–99)
Glucose-Capillary: 261 mg/dL — ABNORMAL HIGH (ref 70–99)
Glucose-Capillary: 98 mg/dL (ref 70–99)
Glucose-Capillary: 169 mg/dL — ABNORMAL HIGH (ref 70–99)

## 2021-11-01 LAB — HEMOGLOBIN A1C
Hgb A1c MFr Bld: 10.4 % — ABNORMAL HIGH (ref 4.8–5.6)
Mean Plasma Glucose: 252 mg/dL

## 2021-11-01 LAB — CREATININE, SERUM
Creatinine, Ser: 1.08 mg/dL (ref 0.61–1.24)
GFR, Estimated: >60 mL/min (ref >60)

## 2021-11-01 MED ORDER — INSULIN ASPART 100 UNIT/ML IJ SOLN
3.0000 [IU] | Freq: Three times a day (TID) | INTRAMUSCULAR | Status: DC
Start: 2021-11-01 — End: 2021-11-07
  Administered 2021-11-01 – 2021-11-07 (×15): 3 [IU] via SUBCUTANEOUS
  Filled 2021-11-01 (×15): qty 1

## 2021-11-01 MED ORDER — ALPRAZOLAM 0.25 MG PO TABS
0.2500 mg | ORAL_TABLET | Freq: Three times a day (TID) | ORAL | Status: DC | PRN
  Administered 2021-11-01 – 2021-11-06 (×4): 0.25 mg via ORAL
  Filled 2021-11-01 (×4): qty 1

## 2021-11-01 MED ORDER — SODIUM CHLORIDE 0.9 % IV SOLN: INTRAVENOUS | Status: DC

## 2021-11-01 NOTE — Progress Notes (Signed)
PROGRESS NOTE    Jesus Flowers  NGE:[id number] DOB: 1966/04/08 DOA: 10/30/2021 PCP: [facility]    Brief Narrative:  56 y.o. 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 Patient then returns 07/2021 with foot infection and diagnosed with residual osteomyelitis at site of R foot TMA. 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 Patient now returns to ed after follow with podiatry 10/30/22 with diagnosis of "severe dehiscence and necrosis of wound and concern for osteomyelitis of the remaining talus and calcaneus, probable septic arthritis of  subtalar and as well as ankle." Per podiatry noted. Per podiatry will need BKA .Patient was referred to ED yesterday but presented today. Per patient he noted worsening pain s/p fall and injury 3-4 days ago.  He notes increase swelling drainage and odor. He notes associated chills but no fever, n/v/d. ON further ros no sob/ chest pain / cough / uri signs and symptoms. He notes he is complaint with is diabetic medication.  Podiatry consulted.  Nothing to offer from their standpoint.  Vascular surgery engaged.  Discussed with patient about high likelihood of BKA.  Patient aware and in agreement.  Infectious disease consulted for recommendations.  Plan for angiogram Friday 4/7 with likely BKA next week   Assessment & Plan:   Principal Problem:   Diabetic foot infection (HCC)  Osteomyelitis right ankle Wound dehiscence with necrosis Complicated soft tissue infection Patient has a history of surgical amputations of the affected extremity Unfortunately wound is dehisced MRI with evidence of osteomyelitis Podiatry cannot offer anything else Vascular surgery consulted ID consulted Plan: Continue IV  antibiotics with infectious disease guidance Okay for diet today N.p.o. after midnight Angiogram Friday 4/7 Likely BKA next week As needed pain control  Diabetes mellitus type 2 with hyperglycemia Basal bolus regimen Carb modified diet Goal blood sugar while hospitalized 140-180  Acute kidney injury Appears resolved Suspect prerenal azotemia Monitor kidney function periodically  Hyponatremia Likely related to elevated blood sugars Resolved with administration of IVF Monitor labs  History of alcohol use No evidence of withdrawal No indication for CIWA protocol  Hyperlipidemia PTA statin  Depression PTA fluoxetine  Hypertension PTA lisinopril    DVT prophylaxis: SQ Lovenox Code Status: Full code Family Communication: None today.  Offered to call but patient declined Disposition Plan: Status is: Inpatient Remains inpatient appropriate because: Acute osteomyelitis associated with wound dehiscence right lower extremity.  Plan for angiogram tomorrow and BKA next week   Level of care: Med-Surg  Consultants:  Podiatry  Procedures:  None  Antimicrobials: Vancomycin Cefepime   Subjective: Seen and examined.  Resting comfortably in bed.  No visible distress.  Endorses pain in right foot, improved over interval  Objective: Vitals:   10/31/21 2001 11/01/21 0046 11/01/21 0418 11/01/21 0750  BP: 134/82 120/73 111/72 131/89  Pulse: 78 75 73 72  Resp: 16 16 16 16   Temp: 98.1 F (36.7 C) 97.9 F (36.6 C) 97.8 F (36.6 C) 98.4 F (36.9 C)  TempSrc:      SpO2: 98% 99% 96% 95%  Weight:      Height:        Intake/Output Summary (Last 24 hours) at 11/01/2021 1043 Last data filed at 11/01/2021 1014 Gross per 24 hour  Intake 683 ml  Output --  Net 683 ml   Filed Weights   10/30/21 1111 10/30/21 1301 10/31/21 0500  Weight: (!) 160 kg 72.6 kg 78.7 kg    Examination:  General exam: No acute distress Respiratory system: Lungs clear.  Normal work of  breathing.  Room air Cardiovascular system: S1-S2, RRR, no murmurs, no pedal edema Gastrointestinal system: Soft, NT/ND, normal bowel sounds Central nervous system: Alert and oriented. No focal neurological deficits. Extremities: Right foot status post TMA.  Surgical dressings.  Foul-smelling Skin: No rashes, lesions or ulcers Psychiatry: Judgement and insight appear normal. Mood & affect appropriate.     Data Reviewed: I have personally reviewed following labs and imaging studies  CBC: Recent Labs  Lab 10/30/21 1038 10/30/21 1839 10/31/21 0445  WBC 11.8* 10.7* 9.5  NEUTROABS 9.9*  --   --   HGB 10.8* 9.8* 8.8*  HCT 32.2* 28.9* 26.6*  MCV 91.5 91.2 91.4  PLT 352 338 296   Basic Metabolic Panel: Recent Labs  Lab 10/30/21 1038 10/30/21 1839 10/31/21 0445 11/01/21 0624  NA 128* 133* 136  --   K 5.2* 4.5 4.4  --   CL 94* 100 105  --   CO2 25 25 24   --   GLUCOSE 557* 271* 110*  --   BUN 42* 35* 30*  --   CREATININE 1.57* 1.22 1.01 1.08  CALCIUM 8.7* 8.5* 8.4*  --   MG  --  2.1  --   --   PHOS  --  3.0  --   --    GFR: Estimated Creatinine Clearance: 75.4 mL/min (by C-G formula based on SCr of 1.08 mg/dL). Liver Function Tests: Recent Labs  Lab 10/30/21 1038 10/31/21 0445  AST 27 15  ALT 34 23  ALKPHOS 289* 176*  BILITOT 0.5 0.5  PROT 7.8 6.5  ALBUMIN 2.8* 2.4*   No results for input(s): LIPASE, AMYLASE in the last 168 hours. No results for input(s): AMMONIA in the last 168 hours. Coagulation Profile: No results for input(s): INR, PROTIME in the last 168 hours. Cardiac Enzymes: No results for input(s): CKTOTAL, CKMB, CKMBINDEX, TROPONINI in the last 168 hours. BNP (last 3 results) No results for input(s): PROBNP in the last 8760 hours. HbA1C: Recent Labs    10/30/21 1839  HGBA1C 10.4*   CBG: Recent Labs  Lab 10/31/21 0759 10/31/21 1203 10/31/21 1719 10/31/21 2003 11/01/21 0751  GLUCAP 122* 152* 281* 212* 122*   Lipid Profile: No results for  input(s): CHOL, HDL, LDLCALC, TRIG, CHOLHDL, LDLDIRECT in the last 72 hours. Thyroid Function Tests: No results for input(s): TSH, T4TOTAL, FREET4, T3FREE, THYROIDAB in the last 72 hours. Anemia Panel: No results for input(s): VITAMINB12, FOLATE, FERRITIN, TIBC, IRON, RETICCTPCT in the last 72 hours. Sepsis Labs: Recent Labs  Lab 10/30/21 1038 10/30/21 1209  LATICACIDVEN 1.0 1.0    Recent Results (from the past 240 hour(s))  WOUND CULTURE     Status: None (Preliminary result)   Collection Time: 10/29/21 11:25 AM  Result Value Ref Range Status   Gram Stain Result Final report  Final   Organism ID, Bacteria Comment  Final    Comment: No white blood cells seen.   Organism ID, Bacteria Comment  Final    Comment: Few gram positive cocci   Aerobic Bacterial Culture Preliminary report  Preliminary   Organism ID, Bacteria Routine flora  Preliminary    Comment: Light growth  Culture, blood (routine x 2)     Status: None (Preliminary result)  Collection Time: 10/30/21 12:09 PM   Specimen: BLOOD  Result Value Ref Range Status   Specimen Description BLOOD BLOOD RIGHT FOREARM  Final   Special Requests   Final    BOTTLES DRAWN AEROBIC AND ANAEROBIC Blood Culture adequate volume   Culture   Final    NO GROWTH 2 DAYS Performed at [facility], [address], [place], [place] [address]    Report Status PENDING  Incomplete  Culture, blood (routine x 2)     Status: None (Preliminary result)   Collection Time: 10/30/21 12:09 PM   Specimen: BLOOD  Result Value Ref Range Status   Specimen Description BLOOD BLOOD RIGHT HAND  Final   Special Requests   Final    BOTTLES DRAWN AEROBIC AND ANAEROBIC Blood Culture adequate volume   Culture   Final    NO GROWTH 2 DAYS Performed at [facility], [address], [place], [place] [address]    Report Status PENDING  Incomplete         Radiology Studies: MR ANKLE RIGHT W WO CONTRAST  Result Date: 10/30/2021 CLINICAL DATA:   Status post Chopart's amputation present with progressive right foot infection/cellulitis. EXAM: MRI OF THE RIGHT ANKLE WITHOUT AND WITH CONTRAST TECHNIQUE: Multiplanar, multisequence MR imaging of the ankle was performed before and after the administration of intravenous contrast. CONTRAST:  7mL GADAVIST GADOBUTROL 1 MMOL/ML IV SOLN COMPARISON:  None. FINDINGS: Bones: Status post prior 2 parts amputation. There is bone marrow edema about the talar head and neck. There is also edema of the anterior process of the calcaneus. Soft Tissue: There is a peripherally enhancing fluid collection adjacent to the anterior process of calcaneus measuring at least 2.0 x 1.7 x 1.7 cm with a sinus track extending anterior aspect of the stump. There is another pocket which extends from the above-mentioned collection to the anterior aspect of the calcaneus measuring 1.6 x 0.8 cm. There is another peripherally enhancing fluid collection about inferomedial aspect of the calcaneus measuring approximately 2.9 x 2.2 x 1.6 cm. Another peripherally enhancing fluid collection about the medial aspect of the prior amputation site measuring at least 2.3 x 1.0 by 2.6 cm. TENDONS Peroneal: Peroneus longus and brevis are intact with surrounding edema. Posteromedial: Posterior tibial tendon intact. Flexor hallucis longus tendon intact. Flexor digitorum longus tendon intact. Trace amount of fluid along the tibialis posterior and flexor digitorum. Anterior: Tibialis anterior tendon intact. Extensor hallucis longus tendon intact Extensor digitorum longus tendon intact. Achilles:  Intact. Plantar Fascia: Intact. Scratch third LIGAMENTS Lateral: Anterior talofibular ligament intact. Calcaneofibular ligament intact. Posterior talofibular ligament intact. Anterior and posterior tibiofibular ligaments intact. Medial: Deltoid ligament intact. Spring ligament intact. CARTILAGE Ankle Joint: Small joint effusion joint effusion. Normal ankle mortise. No  chondral defect. Subtalar Joints/Sinus Tarsi: Normal subtalar joints. No subtalar joint effusion. IMPRESSION: IMPRESSION 1. At least three peripherally enhancing abscesses about the anterior and medial aspect of the ankle with sinus tracts with marked edema of the stump site and phlegmonous changes. Surgical consultation for further management is recommended. 2. Bone marrow edema of the anterior calcaneal process as well as talar head and neck consistent with acute osteomyelitis. 3. Skin thickening and generalized subcutaneous soft tissue edema as well as edema of the muscles of the foot concerning for myositis. Electronically Signed   By: Larose Hires D.O.   On: 10/30/2021 22:54   US ARTERIAL ABI (SCREENING LOWER EXTREMITY)  Result Date: 10/31/2021 CLINICAL DATA:  56 year old male with history of  partial right foot amputation. EXAM: NONINVASIVE PHYSIOLOGIC VASCULAR STUDY OF BILATERAL LOWER EXTREMITIES TECHNIQUE: Evaluation of both lower extremities were performed at rest, including calculation of ankle-brachial indices with single level Doppler, pressure and pulse volume recording. COMPARISON:  None. FINDINGS: Right ABI:  Noncompressible. Left ABI:  1.31 Right Lower Extremity:  Biphasic posterior tibial waveforms. Left Lower Extremity:  Biphasic arterial waveforms at the ankle. IMPRESSION: 1. Noncompressible right ankle brachial index. 2. Normal (1.31) left ankle brachial index. Consider lower extremity arterial duplex, CTA runoff, or direct angiography for further characterization. Marliss Coots, MD [facility] Electronically Signed   By: Marliss Coots M.D.   On: 10/31/2021 10:27   DG Foot Complete Right  Result Date: 10/30/2021 CLINICAL DATA:  History of partial right foot amputation few months ago. Pain and swelling. EXAM: RIGHT FOOT COMPLETE - 3+ VIEW COMPARISON:  08/14/2021 FINDINGS: Diffuse subcutaneous soft tissue swelling/edema noted around the  hindfoot. There is a soft tissue defect containing gas medially which is likely an open wound. No definite destructive bony changes to suggest osteomyelitis. Extensive vascular calcifications. IMPRESSION: 1. Air containing medial soft tissue defect likely an open wound. 2. No definite plain film findings for osteomyelitis. 3. Diffuse subcutaneous soft tissue swelling/edema. Electronically Signed   By: Rudie Meyer M.D.   On: 10/30/2021 11:22        Scheduled Meds:  vitamin C  500 mg Oral BID   enoxaparin (LOVENOX) injection  40 mg Subcutaneous Q24H   FLUoxetine  40 mg Oral Daily   gabapentin  300 mg Oral BID   insulin aspart  0-20 Units Subcutaneous TID WC   insulin aspart  3 Units Subcutaneous TID WC   insulin detemir  20 Units Subcutaneous BID   lisinopril  20 mg Oral Daily   multivitamin with minerals  1 tablet Oral Daily   nutrition supplement (JUVEN)  1 packet Oral BID BM   pravastatin  20 mg Oral q1800   Ensure Max Protein  11 oz Oral QHS   sodium chloride flush  3 mL Intravenous Q12H   traZODone  50 mg Oral QHS   zinc sulfate  220 mg Oral Daily   Continuous Infusions:  ampicillin-sulbactam (UNASYN) IV 3 g (11/01/21 1610)     LOS: 2 days     Tresa Moore, MD [facility]   If 7PM-7AM, please contact night-coverage  11/01/2021, 10:43 AM

## 2021-11-01 NOTE — Progress Notes (Signed)
Inpatient Diabetes Program Recommendations ? ?AACE/ADA: New Consensus Statement on Inpatient Glycemic Control (2015) ? ?Target Ranges:  Prepandial:   less than 140 mg/dL ?     Peak postprandial:   less than 180 mg/dL (1-2 hours) ?     Critically ill patients:  140 - 180 mg/dL  ? ?Lab Results  ?Component Value Date  ? GLUCAP 122 (H) 11/01/2021  ? HGBA1C 10.4 (H) 10/30/2021  ? ?Review of Glycemic Control ? Latest Reference Range & Units 10/31/21 07:59 10/31/21 12:03 10/31/21 17:19 10/31/21 20:03 11/01/21 07:51  ?Glucose-Capillary 70 - 99 mg/dL 562 (H) 563 (H) 893 (H) 212 (H) 122 (H)  ? ?Diabetes history: DM 2 ?Outpatient Diabetes medications: Lantus 12-15 units bid ?Current orders for Inpatient glycemic control:  ?Levemir 20 units bid ?Novolog 0-20 units tid ? ?Last A1c 11.6% on 08/12/2021 ?Current A1c 10.4% this admission ? ?Inpatient Diabetes Program Recommendations:   ? ?Glucose trends increase after meal intake. Note NPO after midnight. ? ?- Consider starting Novolog 3-4 units tid meal coverage if eating >50% of meals when eating ? ?Spoke with pt at bedside on 4/5. ? ?Thanks, ? ?Christena Deem RN, MSN, BC-ADM ?Inpatient Diabetes Coordinator ?Team Pager [phone] (8a-5p) ? ? ?

## 2021-11-01 NOTE — Progress Notes (Signed)
Pt very tearful throughout the day and states that he usually take Fluoxetine at home QD. MD notified new orders placed and will continue to monitor  ?

## 2021-11-02 ENCOUNTER — Encounter
Admission: EM | Disposition: A | Discharge status: Home Health | Payer: Self-pay | Source: Home / Self Care | Attending: Internal Medicine

## 2021-11-02 DIAGNOSIS — I70201 Unspecified atherosclerosis of native arteries of extremities, right leg: Secondary | ICD-10-CM

## 2021-11-02 HISTORY — PX: LOWER EXTREMITY ANGIOGRAPHY: CATH118251

## 2021-11-02 LAB — GLUCOSE, CAPILLARY
Glucose-Capillary: 131 mg/dL — ABNORMAL HIGH (ref 70–99)
Glucose-Capillary: 81 mg/dL (ref 70–99)
Glucose-Capillary: 141 mg/dL — ABNORMAL HIGH (ref 70–99)
Glucose-Capillary: 207 mg/dL — ABNORMAL HIGH (ref 70–99)

## 2021-11-02 LAB — CREATININE, SERUM
Creatinine, Ser: 1.18 mg/dL (ref 0.61–1.24)
GFR, Estimated: >60 mL/min (ref >60)

## 2021-11-02 LAB — BUN: BUN: 28 mg/dL — ABNORMAL HIGH (ref 6–20)

## 2021-11-02 SURGERY — LOWER EXTREMITY ANGIOGRAPHY
Anesthesia: Moderate Sedation | Laterality: Right

## 2021-11-02 MED ORDER — MIDAZOLAM HCL 2 MG/2ML IJ SOLN
INTRAMUSCULAR | Status: DC | PRN
Start: 2021-11-02 — End: 2021-11-02
  Administered 2021-11-02: 2 mg via INTRAVENOUS

## 2021-11-02 MED ORDER — CEFAZOLIN SODIUM-DEXTROSE 2-4 GM/100ML-% IV SOLN
2.0000 g | Freq: Once | INTRAVENOUS | Status: AC
Start: 2021-11-02 — End: 2021-11-03
  Administered 2021-11-02: 2 g via INTRAVENOUS

## 2021-11-02 MED ORDER — MIDAZOLAM HCL 2 MG/2ML IJ SOLN
INTRAMUSCULAR | Status: AC
  Filled 2021-11-02: qty 4

## 2021-11-02 MED ORDER — FENTANYL CITRATE PF 50 MCG/ML IJ SOSY
PREFILLED_SYRINGE | INTRAMUSCULAR | Status: AC
  Filled 2021-11-02: qty 2

## 2021-11-02 MED ORDER — FAMOTIDINE 20 MG PO TABS
40.0000 mg | ORAL_TABLET | Freq: Once | ORAL | Status: DC | PRN
Start: 2021-11-02 — End: 2021-11-02

## 2021-11-02 MED ORDER — DIPHENHYDRAMINE HCL 50 MG/ML IJ SOLN: 50.0000 mg | Freq: Once | INTRAMUSCULAR | Status: DC | PRN

## 2021-11-02 MED ORDER — METHYLPREDNISOLONE SODIUM SUCC 125 MG IJ SOLR: 125.0000 mg | Freq: Once | INTRAMUSCULAR | Status: DC | PRN

## 2021-11-02 MED ORDER — FENTANYL CITRATE (PF) 100 MCG/2ML IJ SOLN
INTRAMUSCULAR | Status: DC | PRN
Start: 2021-11-02 — End: 2021-11-02
  Administered 2021-11-02: 50 ug via INTRAVENOUS

## 2021-11-02 MED ORDER — HEPARIN SODIUM (PORCINE) 1000 UNIT/ML IJ SOLN
INTRAMUSCULAR | Status: AC
  Filled 2021-11-02: qty 10

## 2021-11-02 MED ORDER — HYDROMORPHONE HCL 1 MG/ML IJ SOLN
1.0000 mg | Freq: Once | INTRAMUSCULAR | Status: AC | PRN
  Administered 2021-11-03: 1 mg via INTRAVENOUS
  Filled 2021-11-02: qty 1

## 2021-11-02 MED ORDER — MIDAZOLAM HCL 2 MG/ML PO SYRP: 8.0000 mg | ORAL_SOLUTION | Freq: Once | ORAL | Status: DC | PRN

## 2021-11-02 MED ORDER — ONDANSETRON HCL 4 MG/2ML IJ SOLN: 4.0000 mg | Freq: Four times a day (QID) | INTRAMUSCULAR | Status: DC | PRN

## 2021-11-02 SURGICAL SUPPLY — 9 items
CATH ANGIO 5F PIGTAIL 65CM (CATHETERS) ×1 IMPLANT
COVER PROBE U/S 5X48 (MISCELLANEOUS) ×1 IMPLANT
DEVICE STARCLOSE SE CLOSURE (Vascular Products) ×1 IMPLANT
GLIDEWIRE ADV .035X260CM (WIRE) ×1 IMPLANT
PACK ANGIOGRAPHY (CUSTOM PROCEDURE TRAY) ×2 IMPLANT
SHEATH BRITE TIP 5FRX11 (SHEATH) ×1 IMPLANT
SYR MEDRAD MARK 7 150ML (SYRINGE) ×1 IMPLANT
TUBING CONTRAST HIGH PRESS 72 (TUBING) ×1 IMPLANT
WIRE GUIDERIGHT .035X150 (WIRE) ×1 IMPLANT

## 2021-11-02 NOTE — Plan of Care (Signed)

## 2021-11-02 NOTE — Progress Notes (Signed)
PROGRESS NOTE    Jesus Flowers  ZOX:[id number] DOB: 08/19/1965 DOA: 10/30/2021 PCP: [facility]    Brief Narrative:  56 y.o. 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 Patient then returns 07/2021 with foot infection and diagnosed with residual osteomyelitis at site of R foot TMA. 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 Patient now returns to ed after follow with podiatry 10/30/22 with diagnosis of "severe dehiscence and necrosis of wound and concern for osteomyelitis of the remaining talus and calcaneus, probable septic arthritis of  subtalar and as well as ankle." Per podiatry noted. Per podiatry will need BKA .Patient was referred to ED yesterday but presented today. Per patient he noted worsening pain s/p fall and injury 3-4 days ago.  He notes increase swelling drainage and odor. He notes associated chills but no fever, n/v/d. ON further ros no sob/ chest pain / cough / uri signs and symptoms. He notes he is complaint with is diabetic medication.  Podiatry consulted.  Nothing to offer from their standpoint.  Vascular surgery engaged.  Discussed with patient about high likelihood of BKA.  Patient aware and in agreement.  Infectious disease consulted for recommendations.  Plan for angiogram Friday 4/7 with likely BKA next week   Assessment & Plan:   Principal Problem:   Diabetic foot infection (HCC)  Osteomyelitis right ankle Wound dehiscence with necrosis Complicated soft tissue infection Patient has a history of surgical amputations of the affected extremity Unfortunately wound is dehisced MRI with evidence of osteomyelitis Podiatry cannot offer anything else Vascular surgery consulted ID consulted Plan: Continue IV  Unasyn N.p.o. today Lower extremity angiogram today Tentative plan BKA early next week As needed pain control  Diabetes mellitus type 2 with hyperglycemia Basal bolus regimen Carb modified diet Goal blood sugar while hospitalized 140-180  Acute kidney injury Appears resolved Suspect prerenal azotemia Monitor kidney function while on antibiotics  Hyponatremia Likely related to elevated blood sugars Resolved with administration of IVF Monitor labs  History of alcohol use No evidence of withdrawal No indication for CIWA protocol  Hyperlipidemia PTA statin  Depression PTA fluoxetine  Hypertension PTA lisinopril    DVT prophylaxis: SQ Lovenox Code Status: Full code Family Communication: None today.  Offered to call but patient declined Disposition Plan: Status is: Inpatient Remains inpatient appropriate because: Acute osteomyelitis associated with wound dehiscence right lower extremity.  Lower extremity angiogram today   Level of care: Med-Surg  Consultants:  Podiatry  Procedures:  None  Antimicrobials: Vancomycin Cefepime   Subjective: Seen and examined.  Resting comfortably in bed.  No visible distress.  Endorses pain in right foot, improved over interval  Objective: Vitals:   11/02/21 0946 11/02/21 0951 11/02/21 0956 11/02/21 1001  BP: (!) 153/92 122/86 117/84 131/84  Pulse: 71 67 67 73  Resp: 16 12 12 14   Temp:      TempSrc:      SpO2: 96% 95% 95% 96%  Weight:      Height:        Intake/Output Summary (Last 24 hours) at 11/02/2021 1006 Last data filed at 11/02/2021 0600 Gross per 24 hour  Intake 1623.92 ml  Output --  Net 1623.92 ml   Filed Weights   10/30/21 1301 10/31/21 0500 11/02/21 0500  Weight: 72.6 kg  78.7 kg 78.9 kg    Examination:  General exam: No acute distress Respiratory system: Lungs clear.  Normal respiratory effort.  Room air Cardiovascular system: S1-S2, RRR, no murmurs, no pedal edema Gastrointestinal system: Soft,  NT/ND, normal bowel sounds Central nervous system: Alert and oriented. No focal neurological deficits. Extremities: Right foot status post TMA.  Surgical dressings.  Foul-smelling Skin: No rashes, lesions or ulcers Psychiatry: Judgement and insight appear normal. Mood & affect appropriate.     Data Reviewed: I have personally reviewed following labs and imaging studies  CBC: Recent Labs  Lab 10/30/21 1038 10/30/21 1839 10/31/21 0445  WBC 11.8* 10.7* 9.5  NEUTROABS 9.9*  --   --   HGB 10.8* 9.8* 8.8*  HCT 32.2* 28.9* 26.6*  MCV 91.5 91.2 91.4  PLT 352 338 296   Basic Metabolic Panel: Recent Labs  Lab 10/30/21 1038 10/30/21 1839 10/31/21 0445 11/01/21 0624 11/02/21 0027  NA 128* 133* 136  --   --   K 5.2* 4.5 4.4  --   --   CL 94* 100 105  --   --   CO2 25 25 24   --   --   GLUCOSE 557* 271* 110*  --   --   BUN 42* 35* 30*  --  28*  CREATININE 1.57* 1.22 1.01 1.08 1.18  CALCIUM 8.7* 8.5* 8.4*  --   --   MG  --  2.1  --   --   --   PHOS  --  3.0  --   --   --    GFR: Estimated Creatinine Clearance: 69 mL/min (by C-G formula based on SCr of 1.18 mg/dL). Liver Function Tests: Recent Labs  Lab 10/30/21 1038 10/31/21 0445  AST 27 15  ALT 34 23  ALKPHOS 289* 176*  BILITOT 0.5 0.5  PROT 7.8 6.5  ALBUMIN 2.8* 2.4*   No results for input(s): LIPASE, AMYLASE in the last 168 hours. No results for input(s): AMMONIA in the last 168 hours. Coagulation Profile: No results for input(s): INR, PROTIME in the last 168 hours. Cardiac Enzymes: No results for input(s): CKTOTAL, CKMB, CKMBINDEX, TROPONINI in the last 168 hours. BNP (last 3 results) No results for input(s): PROBNP in the last 8760 hours. HbA1C: Recent Labs    10/30/21 1839  HGBA1C 10.4*   CBG: Recent Labs  Lab 11/01/21 0751 11/01/21 1147 11/01/21 1658 11/01/21 2105 11/02/21 0814  GLUCAP 122* 261* 98 169* 131*   Lipid Profile: No results for input(s): CHOL, HDL, LDLCALC, TRIG, CHOLHDL, LDLDIRECT  in the last 72 hours. Thyroid Function Tests: No results for input(s): TSH, T4TOTAL, FREET4, T3FREE, THYROIDAB in the last 72 hours. Anemia Panel: No results for input(s): VITAMINB12, FOLATE, FERRITIN, TIBC, IRON, RETICCTPCT in the last 72 hours. Sepsis Labs: Recent Labs  Lab 10/30/21 1038 10/30/21 1209  LATICACIDVEN 1.0 1.0    Recent Results (from the past 240 hour(s))  WOUND CULTURE     Status: None (Preliminary result)   Collection Time: 10/29/21 11:25 AM  Result Value Ref Range Status   Gram Stain Result Final report  Final   Organism ID, Bacteria Comment  Final    Comment: No white blood cells seen.   Organism ID, Bacteria Comment  Final    Comment: Few gram positive cocci   Aerobic Bacterial Culture Preliminary report  Preliminary   Organism ID, Bacteria Routine flora  Preliminary    Comment: Light growth   Organism ID, Bacteria Comment  Preliminary  Comment: Microbiological testing to rule out the presence of possible pathogens is in progress. Light growth   Culture, blood (routine x 2)     Status: None (Preliminary result)   Collection Time: 10/30/21 12:09 PM   Specimen: BLOOD  Result Value Ref Range Status   Specimen Description BLOOD BLOOD RIGHT FOREARM  Final   Special Requests   Final    BOTTLES DRAWN AEROBIC AND ANAEROBIC Blood Culture adequate volume   Culture   Final    NO GROWTH 3 DAYS Performed at [facility], [address], [place], [place] [address]    Report Status PENDING  Incomplete  Culture, blood (routine x 2)     Status: None (Preliminary result)   Collection Time: 10/30/21 12:09 PM   Specimen: BLOOD  Result Value Ref Range Status   Specimen Description BLOOD BLOOD RIGHT HAND  Final   Special Requests   Final    BOTTLES DRAWN AEROBIC AND ANAEROBIC Blood Culture adequate volume   Culture   Final    NO GROWTH 3 DAYS Performed at [facility], [address] [place], [place] [address]    Report Status PENDING   Incomplete         Radiology Studies: PERIPHERAL VASCULAR CATHETERIZATION  Result Date: 11/02/2021 See surgical note for result.  US ARTERIAL ABI (SCREENING LOWER EXTREMITY)  Result Date: 10/31/2021 CLINICAL DATA:  56 year old male with history of partial right foot amputation. EXAM: NONINVASIVE PHYSIOLOGIC VASCULAR STUDY OF BILATERAL LOWER EXTREMITIES TECHNIQUE: Evaluation of both lower extremities were performed at rest, including calculation of ankle-brachial indices with single level Doppler, pressure and pulse volume recording. COMPARISON:  None. FINDINGS: Right ABI:  Noncompressible. Left ABI:  1.31 Right Lower Extremity:  Biphasic posterior tibial waveforms. Left Lower Extremity:  Biphasic arterial waveforms at the ankle. IMPRESSION: 1. Noncompressible right ankle brachial index. 2. Normal (1.31) left ankle brachial index. Consider lower extremity arterial duplex, CTA runoff, or direct angiography for further characterization. Marliss Coots, MD [facility] Electronically Signed   By: Marliss Coots M.D.   On: 10/31/2021 10:27        Scheduled Meds:  [MAR Hold] vitamin C  500 mg Oral BID   [MAR Hold] enoxaparin (LOVENOX) injection  40 mg Subcutaneous Q24H   fentaNYL       [MAR Hold] FLUoxetine  40 mg Oral Daily   [MAR Hold] gabapentin  300 mg Oral BID   heparin sodium (porcine)       [MAR Hold] insulin aspart  0-20 Units Subcutaneous TID WC   [MAR Hold] insulin aspart  3 Units Subcutaneous TID WC   [MAR Hold] insulin detemir  20 Units Subcutaneous BID   [MAR Hold] lisinopril  20 mg Oral Daily   midazolam       [MAR Hold] multivitamin with minerals  1 tablet Oral Daily   [MAR Hold] nutrition supplement (JUVEN)  1 packet Oral BID BM   [MAR Hold] pravastatin  20 mg Oral q1800   [MAR Hold] Ensure Max Protein  11 oz Oral QHS   [MAR Hold] sodium chloride flush  3 mL Intravenous Q12H   [MAR Hold] traZODone  50 mg Oral QHS    [MAR Hold] zinc sulfate  220 mg Oral Daily   Continuous Infusions:  sodium chloride 75 mL/hr at 11/02/21 0932   [MAR Hold] ampicillin-sulbactam (UNASYN) IV 3 g (11/02/21 0514)    ceFAZolin (ANCEF) IV 2 g (11/02/21 0949)  LOS: 3 days     Tresa Moore, MD [facility]   If 7PM-7AM, please contact night-coverage  11/02/2021, 10:06 AM

## 2021-11-02 NOTE — Progress Notes (Signed)
Dr. Wyn Quaker in at bedside to speak with pt. Re: procedural results. Pt. Verbalized understanding of conversation . ?

## 2021-11-02 NOTE — Interval H&P Note (Signed)
History and Physical Interval Note: ? ?11/02/2021 ?9:18 AM ? ?Jesus Flowers  has presented today for surgery, with the diagnosis of Osteo Right Leg.  The various methods of treatment have been discussed with the patient and family. After consideration of risks, benefits and other options for treatment, the patient has consented to  Procedure(s): ?Lower Extremity Angiography (Right) as a surgical intervention.  The patient's history has been reviewed, patient examined, no change in status, stable for surgery.  I have reviewed the patient's chart and labs.  Questions were answered to the patient's satisfaction.   ? ? ?Festus Barren ? ? ?

## 2021-11-02 NOTE — Progress Notes (Signed)
ID ?Pt had angio today ?Doing well ?No fever or pain ? ?O/e awake and alert ?BP 119/78   Pulse 71   Temp 97.9 ?F (36.6 ?C) (Oral)   Resp 17   Ht 5\' 6"  (1.676 m)   Wt 78.9 kg   SpO2 97%   BMI 28.08 kg/m?   ?Chest b/l air entry ?Hss1s2 ?Abd soft ?Rt foot- stump wound ? ? ? ? ? ?Labs ? ?  Latest Ref Rng & Units 10/31/2021  ?  4:45 AM 10/30/2021  ?  6:39 PM 10/30/2021  ? 10:38 AM  ?CBC  ?WBC 4.0 - 10.5 K/uL 9.5   10.7   11.8    ?Hemoglobin 13.0 - 17.0 g/dL 8.8   9.8   12/30/2021    ?Hematocrit 39.0 - 52.0 % 26.6   28.9   32.2    ?Platelets 150 - 400 K/uL 296   338   352    ?  ? ?  Latest Ref Rng & Units 11/02/2021  ? 12:27 AM 11/01/2021  ?  6:24 AM 10/31/2021  ?  4:45 AM  ?CMP  ?Glucose 70 - 99 mg/dL   12/31/2021    ?BUN 6 - 20 mg/dL 28    30    ?Creatinine 0.61 - 1.24 mg/dL 676   7.20   9.47    ?Sodium 135 - 145 mmol/L   136    ?Potassium 3.5 - 5.1 mmol/L   4.4    ?Chloride 98 - 111 mmol/L   105    ?CO2 22 - 32 mmol/L   24    ?Calcium 8.9 - 10.3 mg/dL   8.4    ?Total Protein 6.5 - 8.1 g/dL   6.5    ?Total Bilirubin 0.3 - 1.2 mg/dL   0.5    ?Alkaline Phos 38 - 126 U/L   176    ?AST 15 - 41 U/L   15    ?ALT 0 - 44 U/L   23    ?  ? ?Micro ?Wound culture NG ? ?Angio result ? normal common femoral artery, profunda femoris artery, superficial femoral artery, and popliteal artery.  There was a typical tibial trifurcation with three-vessel runoff distally with moderate stenosis in the mid peroneal artery but otherwise no focal disease.  Adequate blood flow for wound healing. ? ?Impression/recommendation ? ?Infection  of the rt foot amputation stump. 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 Dehiscence and abscess at the surgical site. Osteomyelitis of calcaneum ?Recent wound culture on 10/29/21 only skin flora  ?Pt is the past has had Group B strep infections ? clinically stable with normal wbc and culture Ng ?On unasyn ? ?Angio done today showed adquate blood flow for wound healing Awaiting BKA ?  ?DM- poorly controlled- hypergylcemia on  presentation ?  ?Peripheral neuropathy ?? ?Anemia? ?  ?AKI- resolved ? ?ID will follow him peripherally this weekend- ?Call if needed ?

## 2021-11-02 NOTE — Op Note (Signed)
[facility] ? Percutaneous Study/Intervention Procedural Note ? ? ?Date of Surgery: 11/02/2021 ? ?Surgeon(s):Laydon Martis   ? ?Assistants:none ? 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 ?Post-operative diagnosis:  Same ? ?Procedure(s) Performed: ?            1.  Ultrasound guidance for vascular access left femoral artery ?            2.  Catheter placement into right superficial femoral artery from left femoral approach ?            3.  Aortogram and selective right lower extremity angiogram ?            4.  StarClose closure device left femoral artery ? ?EBL: 10 cc ? ?Contrast: 30 cc ? ?Fluoro Time: 1.1 minutes ? ?Moderate Conscious Sedation Time: approximately 17 minutes using 2 mg of Versed and 50 mcg of Fentanyl ?             ?Indications:  Patient is a 56 y.o.male with a badly infected right foot status post right transmetatarsal amputation that is likely to require below-knee amputation. The patient is brought in for angiography for further evaluation and potential treatment.  Due to the limb threatening nature of the situation, angiogram was performed for attempted limb salvage and to at least make sure he has enough blood flow to heal a below-knee amputation at this point as his transmetatarsal amputation is unlikely to be salvageable. The patient is aware that amputation would be expected.  The patient also understands that even with successful revascularization, amputation as expected due to the severity of the situation.  Risks and benefits are discussed and informed consent is obtained.  ? ?Procedure:  The patient was identified and appropriate procedural time out was performed.  The patient was then placed supine on the table and prepped and draped in the usual sterile fashion. Moderate conscious sedation was administered during a face to face encounter with the patient throughout the procedure with my supervision of the RN administering medicines and monitoring the  patient's vital signs, pulse oximetry, telemetry and mental status throughout from the start of the procedure until the patient was taken to the recovery room. Ultrasound was used to evaluate the left common femoral artery.  It was patent .  A digital ultrasound image was acquired.  A Seldinger needle was used to access the left common femoral artery under direct ultrasound guidance and a permanent image was performed.  A 0.035 J wire was advanced without resistance and a 5Fr sheath was placed.  Pigtail catheter was placed into the aorta and an AP aortogram was performed. This demonstrated normal renal arteries and normal aorta and iliac segments without significant stenosis. I then crossed the aortic bifurcation and advanced to the right femoral head and then into the mid right SFA to opacify distally. Selective right lower extremity angiogram was then performed. This demonstrated normal common femoral artery, profunda femoris artery, superficial femoral artery, and popliteal artery.  There was a typical tibial trifurcation with three-vessel runoff distally with moderate stenosis in the mid peroneal artery but otherwise no focal disease.  Adequate blood flow for wound healing. I elected to terminate the procedure. The sheath was removed and StarClose closure device was deployed in the left femoral artery with excellent hemostatic result. The patient was taken to the recovery room in stable condition having tolerated the procedure well. ? ?Findings:   ?  Aortogram: Normal renal arteries, normal aorta and iliac arteries without significant stenosis. ?            Right lower Extremity: This demonstrated normal common femoral artery, profunda femoris artery, superficial femoral artery, and popliteal artery.  There was a typical tibial trifurcation with three-vessel runoff distally with moderate stenosis in the mid peroneal artery but otherwise no focal disease.  Adequate blood flow for wound  healing. ? ? ?Disposition: Patient was taken to the recovery room in stable condition having tolerated the procedure well. ? ?Complications: None ? ?Festus Barren ?11/02/2021 ?10:18 AM ? ? ?This note was created with Dragon Medical transcription system. Any errors in dictation are purely unintentional.  ?

## 2021-11-02 NOTE — Progress Notes (Addendum)
Nutrition Follow-up ? ?DOCUMENTATION CODES:  ? ?Not applicable ? ?INTERVENTION:  ? ?-Continue MVI with minerals daily ?-Continue 500 mg vitamin C BID ?-Continue 220 mg zinc sulfate daily x 14 days ?-Continue 1 packet Juven BID, each packet provides 95 calories, 2.5 grams of protein (collagen), and 9.8 grams of carbohydrate (3 grams sugar); also contains 7 grams of L-arginine and L-glutamine, 300 mg vitamin C, 15 mg vitamin E, 1.2 mcg vitamin B-12, 9.5 mg zinc, 200 mg calcium, and 1.5 g  Calcium Beta-hydroxy-Beta-methylbutyrate to support wound healing  ?-Continue Ensure Max po daily, each supplement provides 150 kcal and 30 grams of protein ?-Liberalize diet to carb modified ? ?NUTRITION DIAGNOSIS:  ? ?Increased nutrient needs related to wound healing as evidenced by estimated needs. ? ?Ongoing ? ?GOAL:  ? ?Patient will meet greater than or equal to 90% of their needs ? ?Progressing  ? ?MONITOR:  ? ?PO intake, Supplement acceptance, Diet advancement, Labs, Weight trends, Skin, I & O's ? ?REASON FOR ASSESSMENT:  ? ?Consult ?Assessment of nutrition requirement/status, Wound healing ? ?ASSESSMENT:  ? ?Jesus Flowers is a 56 y.o. 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 Patient then returns 07/2021 with foot infection and diagnosed with residual osteomyelitis at site of R foot TMA. 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 Patient now returns to ed after follow with podiatry 10/30/22 with diagnosis of "severe dehiscence and necrosis of wound and concern for osteomyelitis of the remaining talus and calcaneus, probable septic arthritis of  subtalar and as well as ankle." Per podiatry noted. Per podiatry will need BKA .Patient was referred to ED yesterday but presented today.  Per patient he noted worsening pain s/p fall and injury 3-4 days ago.  He notes increase swelling drainage and odor. He notes associated chills but no fever, n/v/d. ON further ros no sob/ chest pain / cough / uri signs and symptoms. He notes he is complaint with is diabetic medication. ? ?4/7- Procedure(s): ?Lower Extremity Angiography (Right)  ? ?Reviewed I/O's: +1.6 L x 24 hours and +4.6 L since admission ? ?Pt out of room at time of visit. Per vascular surgery notes, plan for angiogram today and BKA next week.  ? ?Per MD notes, pt complains of sweating with PO intake. Suspect this may be related to gustatory sweating, which is a complication of DM.  Case discussed with MD; suggested topical glycopyrrolate to help relieve symptoms.  ? ?Pt with good appetite. Noted meal completions 90-100%. Pt has been complaint with his supplements.  ? ?Medications reviewed and include sodium chloride infusion @ 75 ml/hr.  ? ?Labs reviewed: CBGS: 98-261 (inpatient orders for glycemic control are 0-20 units insulin aspart TID with meals, 3 units insulin aspart TID with meals, and 20 units insulin detemir daily).   ? ?Diet Order:   ?Diet Order   ? ?       ?  Diet NPO time specified  Diet effective midnight       ?  ? ?  ?  ? ?  ? ? ?EDUCATION NEEDS:  ? ?Education needs have been addressed ? ?Skin:  Skin Assessment: Reviewed RN Assessment ? ?Last BM:  10/31/21 ? ?Height:  ? ?Ht Readings from Last 1 Encounters:  ?10/30/21 5\' 6"  (1.676 m)  ? ? ?Weight:  ? ?  Wt Readings from Last 1 Encounters:  ?11/02/21 78.9 kg  ? ? ?Ideal Body Weight:  64.5 kg ? ?BMI:  Body mass index is 28.08 kg/m?. ? ?Estimated Nutritional Needs:  ? ?Kcal:  1750-1950 ? ?Protein:  90-105 grams ? ?Fluid:  > 1.7 L ? ? ? ?Levada Schilling, RD, LDN, CDCES ?Registered Dietitian II ?Certified Diabetes Care and Education Specialist ?Please refer to [facility] for RD and/or RD on-call/weekend/after hours pager  ?

## 2021-11-03 ENCOUNTER — Encounter: Payer: Self-pay | Admitting: Vascular Surgery

## 2021-11-03 LAB — GLUCOSE, CAPILLARY
Glucose-Capillary: 125 mg/dL — ABNORMAL HIGH (ref 70–99)
Glucose-Capillary: 132 mg/dL — ABNORMAL HIGH (ref 70–99)
Glucose-Capillary: 141 mg/dL — ABNORMAL HIGH (ref 70–99)
Glucose-Capillary: 82 mg/dL (ref 70–99)

## 2021-11-03 LAB — WOUND CULTURE

## 2021-11-03 MED ORDER — CEFAZOLIN (ANCEF) 1 G IV SOLR: 2.0000 g | INTRAVENOUS | Status: DC

## 2021-11-03 MED ORDER — CEFAZOLIN SODIUM-DEXTROSE 2-4 GM/100ML-% IV SOLN
2.0000 g | INTRAVENOUS | Status: AC
  Administered 2021-11-04: 2 g via INTRAVENOUS

## 2021-11-03 NOTE — Progress Notes (Signed)
I discussed in depth the nature of below-the-knee amputation with the patient, including risks, benefits, and alternatives.  The patient is aware that the risks of below-the-knee amputation include but are not limited to: bleeding, infection, myocardial infarction, stroke, death, failure to heal amputation wound, and possible need for more proximal amputation.  The patient is aware of the risks and agrees proceed forward with the procedure.  ?

## 2021-11-03 NOTE — Progress Notes (Signed)
PROGRESS NOTE    Jesus Flowers  ZOX:[id number] DOB: 12/29/1965 DOA: 10/30/2021 PCP: [facility]    Brief Narrative:  56 y.o. 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 Patient then returns 07/2021 with foot infection and diagnosed with residual osteomyelitis at site of R foot TMA. 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 Patient now returns to ed after follow with podiatry 10/30/22 with diagnosis of "severe dehiscence and necrosis of wound and concern for osteomyelitis of the remaining talus and calcaneus, probable septic arthritis of  subtalar and as well as ankle." Per podiatry noted. Per podiatry will need BKA .Patient was referred to ED yesterday but presented today. Per patient he noted worsening pain s/p fall and injury 3-4 days ago.  He notes increase swelling drainage and odor. He notes associated chills but no fever, n/v/d. ON further ros no sob/ chest pain / cough / uri signs and symptoms. He notes he is complaint with is diabetic medication.  Podiatry consulted.  Nothing to offer from their standpoint.  Vascular surgery engaged.  Discussed with patient about high likelihood of BKA.  Patient aware and in agreement.  Infectious disease consulted for recommendations.    Status post angiogram 4/7.  On 4/8 pain well controlled.  Patient overall stable.  Case discussed with vascular surgery Dr. Renne Crigler at bedside.  We will plan for BKA 4/9  Assessment & Plan:   Principal Problem:   Diabetic foot infection (HCC)  Osteomyelitis right ankle Wound dehiscence with necrosis Complicated soft tissue infection Patient has a history of surgical amputations of the affected extremity Unfortunately wound is dehisced MRI with evidence  of osteomyelitis Podiatry cannot offer anything else Vascular surgery consulted Status post lower extremity angiogram/7 ID consulted Plan: Continue IV Unasyn Okay for diet today BKA planned Sunday 4/9 Case discussed with vascular surgery Dr. 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 We will discuss possibility of topical glycopyrrolate at time of discharge  Acute kidney injury Appears resolved Suspect prerenal azotemia Monitor kidney function while on antibiotics  Hyponatremia Likely related to elevated blood sugars Resolved with administration of IVF Monitor labs  History of alcohol use No evidence of withdrawal No indication for CIWA protocol  Hyperlipidemia PTA statin  Depression PTA fluoxetine  Hypertension PTA lisinopril    DVT prophylaxis: SQ Lovenox Code Status: Full code Family Communication: Patient's daughter via phone 4/7 Disposition Plan: Status is: Inpatient Remains inpatient appropriate because: Acute osteomyelitis associated with wound dehiscence right lower extremity.  Extremity nonviable.  Plan for BKA 4/9   Level of care: Med-Surg  Consultants:  Podiatry  Procedures:  None  Antimicrobials: Vancomycin Cefepime   Subjective: Seen and examined.  Resting comfortably in bed.  No visible distress.  Endorses pain in right foot, improved over interval  Objective: Vitals:   11/02/21 2038 11/03/21 0500 11/03/21 0517 11/03/21 0802  BP: (!) 152/95  (!) 153/84 121/71  Pulse: 81  80 73  Resp: 16  16 16   Temp: 98.2 F (36.8 C)  98.6 F (  37 C) 98.1 F (36.7 C)  TempSrc:      SpO2: 98%  91% 94%  Weight:  76.7 kg     Height:        Intake/Output Summary (Last 24 hours) at 11/03/2021 1016 Last data filed at 11/02/2021 1828 Gross per 24 hour  Intake 0 ml  Output --  Net 0 ml   Filed Weights   10/31/21 0500 11/02/21 0500 11/03/21 0500  Weight: 78.7 kg 78.9 kg 76.7 kg    Examination:  General exam: NAD Respiratory system: Lungs clear.  Normal work of breathing.  Room air Cardiovascular system: S1-S2, RRR, no murmurs, no pedal edema Gastrointestinal system: Soft, NT/ND, normal bowel sounds Central nervous system: Alert and oriented. No focal neurological deficits. Extremities: Right foot status post TMA.  Surgical dressings.  Foul-smelling Skin: No rashes, lesions or ulcers Psychiatry: Judgement and insight appear normal. Mood & affect appropriate.     Data Reviewed: I have personally reviewed following labs and imaging studies  CBC: Recent Labs  Lab 10/30/21 1038 10/30/21 1839 10/31/21 0445  WBC 11.8* 10.7* 9.5  NEUTROABS 9.9*  --   --   HGB 10.8* 9.8* 8.8*  HCT 32.2* 28.9* 26.6*  MCV 91.5 91.2 91.4  PLT 352 338 296   Basic Metabolic Panel: Recent Labs  Lab 10/30/21 1038 10/30/21 1839 10/31/21 0445 11/01/21 0624 11/02/21 0027  NA 128* 133* 136  --   --   K 5.2* 4.5 4.4  --   --   CL 94* 100 105  --   --   CO2 25 25 24   --   --   GLUCOSE 557* 271* 110*  --   --   BUN 42* 35* 30*  --  28*  CREATININE 1.57* 1.22 1.01 1.08 1.18  CALCIUM 8.7* 8.5* 8.4*  --   --   MG  --  2.1  --   --   --   PHOS  --  3.0  --   --   --    GFR: Estimated Creatinine Clearance: 68.2 mL/min (by C-G formula based on SCr of 1.18 mg/dL). Liver Function Tests: Recent Labs  Lab 10/30/21 1038 10/31/21 0445  AST 27 15  ALT 34 23  ALKPHOS 289* 176*  BILITOT 0.5 0.5  PROT 7.8 6.5  ALBUMIN 2.8* 2.4*   No results for input(s): LIPASE, AMYLASE in the last 168 hours. No results for input(s): AMMONIA in the last 168 hours. Coagulation Profile: No results for input(s): INR, PROTIME in the last 168  hours. Cardiac Enzymes: No results for input(s): CKTOTAL, CKMB, CKMBINDEX, TROPONINI in the last 168 hours. BNP (last 3 results) No results for input(s): PROBNP in the last 8760 hours. HbA1C: No results for input(s): HGBA1C in the last 72 hours.  CBG: Recent Labs  Lab 11/02/21 0814 11/02/21 1201 11/02/21 1700 11/02/21 2313 11/03/21 0811  GLUCAP 131* 81 141* 207* 125*   Lipid Profile: No results for input(s): CHOL, HDL, LDLCALC, TRIG, CHOLHDL, LDLDIRECT in the last 72 hours. Thyroid Function Tests: No results for input(s): TSH, T4TOTAL, FREET4, T3FREE, THYROIDAB in the last 72 hours. Anemia Panel: No results for input(s): VITAMINB12, FOLATE, FERRITIN, TIBC, IRON, RETICCTPCT in the last 72 hours. Sepsis Labs: Recent Labs  Lab 10/30/21 1038 10/30/21 1209  LATICACIDVEN 1.0 1.0    Recent Results (from the past 240 hour(s))  WOUND CULTURE     Status: Abnormal   Collection Time: 10/29/21 11:25 AM  Result Value Ref Range Status  Gram Stain Result Final report  Final   Organism ID, Bacteria Comment  Final    Comment: No white blood cells seen.   Organism ID, Bacteria Comment  Final    Comment: Few gram positive cocci   Aerobic Bacterial Culture Final report (A)  Final   Organism ID, Bacteria Routine flora  Final    Comment: Light growth   Organism ID, Bacteria Staphylococcus aureus (A)  Final    Comment: Based on susceptibility to oxacillin this isolate would be susceptible to: *Penicillinase-stable penicillins, such as:   Cloxacillin, Dicloxacillin, Nafcillin *Beta-lactam combination agents, such as:   Amoxicillin-clavulanic acid, Ampicillin-sulbactam,   Piperacillin-tazobactam *Oral cephems, such as:   Cefaclor, Cefdinir, Cefpodoxime, Cefprozil, Cefuroxime,   Cephalexin, Loracarbef *Parenteral cephems, such as:   Cefazolin, Cefepime, Cefotaxime, Cefotetan, Ceftaroline,   Ceftizoxime, Ceftriaxone, Cefuroxime *Carbapenems, such as:   Doripenem, Ertapenem, Imipenem,  Meropenem Light growth    Antimicrobial Susceptibility Comment  Final    Comment:       ** S = Susceptible; I = Intermediate; R = Resistant **                    P = Positive; N = Negative             MICS are expressed in micrograms per mL    Antibiotic                 RSLT#1    RSLT#2    RSLT#3    RSLT#4 Ciprofloxacin                            S Clindamycin                              S Erythromycin                             S Gentamicin                               R Levofloxacin                             S Linezolid                                S Moxifloxacin                             S Oxacillin                                S Penicillin                               R Quinupristin/Dalfopristin                S Rifampin                                 S Tetracycline  S Trimethoprim/Sulfa                       S Vancomycin                               S   Culture, blood (routine x 2)     Status: None (Preliminary result)   Collection Time: 10/30/21 12:09 PM   Specimen: BLOOD  Result Value Ref Range Status   Specimen Description BLOOD BLOOD RIGHT FOREARM  Final   Special Requests   Final    BOTTLES DRAWN AEROBIC AND ANAEROBIC Blood Culture adequate volume   Culture   Final    NO GROWTH 4 DAYS Performed at [facility], [address], [place], [place] [address]    Report Status PENDING  Incomplete  Culture, blood (routine x 2)     Status: None (Preliminary result)   Collection Time: 10/30/21 12:09 PM   Specimen: BLOOD  Result Value Ref Range Status   Specimen Description BLOOD BLOOD RIGHT HAND  Final   Special Requests   Final    BOTTLES DRAWN AEROBIC AND ANAEROBIC Blood Culture adequate volume   Culture   Final    NO GROWTH 4 DAYS Performed at [facility], [address] [place], [place] [address]    Report Status PENDING  Incomplete         Radiology Studies: PERIPHERAL VASCULAR  CATHETERIZATION  Result Date: 11/02/2021 See surgical note for result.       Scheduled Meds:  vitamin C  500 mg Oral BID   enoxaparin (LOVENOX) injection  40 mg Subcutaneous Q24H   FLUoxetine  40 mg Oral Daily   gabapentin  300 mg Oral BID   insulin aspart  0-20 Units Subcutaneous TID WC   insulin aspart  3 Units Subcutaneous TID WC   insulin detemir  20 Units Subcutaneous BID   lisinopril  20 mg Oral Daily   multivitamin with minerals  1 tablet Oral Daily   nutrition supplement (JUVEN)  1 packet Oral BID BM   pravastatin  20 mg Oral q1800   Ensure Max Protein  11 oz Oral QHS   sodium chloride flush  3 mL Intravenous Q12H   traZODone  50 mg Oral QHS   zinc sulfate  220 mg Oral Daily   Continuous Infusions:  ampicillin-sulbactam (UNASYN) IV 3 g (11/03/21 0539)   [START ON 11/04/2021]  ceFAZolin (ANCEF) IV       LOS: 4 days     Tresa Moore, MD [facility]   If 7PM-7AM, please contact night-coverage  11/03/2021, 10:16 AM

## 2021-11-03 NOTE — Progress Notes (Signed)
Subjective: Interval History: none..   Objective: Vital signs in last 24 hours: Temp:  [98.1 F (36.7 C)-98.6 F (37 C)] 98.1 F (36.7 C) (04/08 0802) Pulse Rate:  [70-82] 73 (04/08 0802) Resp:  [16-17] 16 (04/08 0802) BP: (119-153)/(71-95) 121/71 (04/08 0802) SpO2:  [91 %-98 %] 94 % (04/08 0802) Weight:  [76.7 kg] 76.7 kg (04/08 0500)  Intake/Output from previous day: No intake/output data recorded. Intake/Output this shift: No intake/output data recorded.  General appearance: alert, cooperative, and no distress  Lab Results: No results for input(s): WBC, HGB, HCT, PLT in the last 72 hours. BMET Recent Labs    11/01/21 0624 11/02/21 0027  BUN  --  28*  CREATININE 1.08 1.18    Studies/Results: MR ANKLE RIGHT W WO CONTRAST  Result Date: 10/30/2021 CLINICAL DATA:  Status post Chopart's amputation present with progressive right foot infection/cellulitis. EXAM: MRI OF THE RIGHT ANKLE WITHOUT AND WITH CONTRAST TECHNIQUE: Multiplanar, multisequence MR imaging of the ankle was performed before and after the administration of intravenous contrast. CONTRAST:  7mL GADAVIST GADOBUTROL 1 MMOL/ML IV SOLN COMPARISON:  None. FINDINGS: Bones: Status post prior 2 parts amputation. There is bone marrow edema about the talar head and neck. There is also edema of the anterior process of the calcaneus. Soft Tissue: There is a peripherally enhancing fluid collection adjacent to the anterior process of calcaneus measuring at least 2.0 x 1.7 x 1.7 cm with a sinus track extending anterior aspect of the stump. There is another pocket which extends from the above-mentioned collection to the anterior aspect of the calcaneus measuring 1.6 x 0.8 cm. There is another peripherally enhancing fluid collection about inferomedial aspect of the calcaneus measuring approximately 2.9 x 2.2 x 1.6 cm. Another peripherally enhancing fluid collection about the medial aspect of the prior amputation site measuring at least  2.3 x 1.0 by 2.6 cm. TENDONS Peroneal: Peroneus longus and brevis are intact with surrounding edema. Posteromedial: Posterior tibial tendon intact. Flexor hallucis longus tendon intact. Flexor digitorum longus tendon intact. Trace amount of fluid along the tibialis posterior and flexor digitorum. Anterior: Tibialis anterior tendon intact. Extensor hallucis longus tendon intact Extensor digitorum longus tendon intact. Achilles:  Intact. Plantar Fascia: Intact. Scratch third LIGAMENTS Lateral: Anterior talofibular ligament intact. Calcaneofibular ligament intact. Posterior talofibular ligament intact. Anterior and posterior tibiofibular ligaments intact. Medial: Deltoid ligament intact. Spring ligament intact. CARTILAGE Ankle Joint: Small joint effusion joint effusion. Normal ankle mortise. No chondral defect. Subtalar Joints/Sinus Tarsi: Normal subtalar joints. No subtalar joint effusion. IMPRESSION: IMPRESSION 1. At least three peripherally enhancing abscesses about the anterior and medial aspect of the ankle with sinus tracts with marked edema of the stump site and phlegmonous changes. Surgical consultation for further management is recommended. 2. Bone marrow edema of the anterior calcaneal process as well as talar head and neck consistent with acute osteomyelitis. 3. Skin thickening and generalized subcutaneous soft tissue edema as well as edema of the muscles of the foot concerning for myositis. Electronically Signed   By: Larose Hires D.O.   On: 10/30/2021 22:54   PERIPHERAL VASCULAR CATHETERIZATION  Result Date: 11/02/2021 See surgical note for result.  US ARTERIAL ABI (SCREENING LOWER EXTREMITY)  Result Date: 10/31/2021 CLINICAL DATA:  56 year old male with history of partial right foot amputation. EXAM: NONINVASIVE PHYSIOLOGIC VASCULAR STUDY OF BILATERAL LOWER EXTREMITIES TECHNIQUE: Evaluation of both lower extremities were performed at rest, including calculation of ankle-brachial indices with single  level Doppler, pressure and pulse volume recording. COMPARISON:  None.  FINDINGS: Right ABI:  Noncompressible. Left ABI:  1.31 Right Lower Extremity:  Biphasic posterior tibial waveforms. Left Lower Extremity:  Biphasic arterial waveforms at the ankle. IMPRESSION: 1. Noncompressible right ankle brachial index. 2. Normal (1.31) left ankle brachial index. Consider lower extremity arterial duplex, CTA runoff, or direct angiography for further characterization. Marliss Coots, MD [facility] Electronically Signed   By: Marliss Coots M.D.   On: 10/31/2021 10:27   DG Foot Complete Right  Result Date: 10/30/2021 CLINICAL DATA:  History of partial right foot amputation few months ago. Pain and swelling. EXAM: RIGHT FOOT COMPLETE - 3+ VIEW COMPARISON:  08/14/2021 FINDINGS: Diffuse subcutaneous soft tissue swelling/edema noted around the hindfoot. There is a soft tissue defect containing gas medially which is likely an open wound. No definite destructive bony changes to suggest osteomyelitis. Extensive vascular calcifications. IMPRESSION: 1. Air containing medial soft tissue defect likely an open wound. 2. No definite plain film findings for osteomyelitis. 3. Diffuse subcutaneous soft tissue swelling/edema. Electronically Signed   By: Rudie Meyer M.D.   On: 10/30/2021 11:22   Anti-infectives: Anti-infectives (From admission, onward)    Start     Dose/Rate Route Frequency Ordered Stop   11/02/21 1000  ceFAZolin (ANCEF) IVPB 2g/100 mL premix        2 g 200 mL/hr over 30 Minutes Intravenous  Once 11/02/21 0948 11/03/21 0823   10/31/21 1800  Ampicillin-Sulbactam (UNASYN) 3 g in sodium chloride 0.9 % 100 mL IVPB        3 g 200 mL/hr over 30 Minutes Intravenous Every 6 hours 10/31/21 1359     10/31/21 1400  vancomycin (VANCOREADY) IVPB 1250 mg/250 mL  Status:  Discontinued        1,250 mg 166.7 mL/hr over 90 Minutes Intravenous Every 24 hours 10/30/21 1538  10/31/21 0951   10/31/21 1400  ceFEPIme (MAXIPIME) 2 g in sodium chloride 0.9 % 100 mL IVPB  Status:  Discontinued        2 g 200 mL/hr over 30 Minutes Intravenous Every 8 hours 10/31/21 0951 10/31/21 1359   10/31/21 1400  vancomycin (VANCOREADY) IVPB 1750 mg/350 mL  Status:  Discontinued        1,750 mg 175 mL/hr over 120 Minutes Intravenous Every 24 hours 10/31/21 0951 10/31/21 1342   10/31/21 0000  ceFEPIme (MAXIPIME) 2 g in sodium chloride 0.9 % 100 mL IVPB  Status:  Discontinued        2 g 200 mL/hr over 30 Minutes Intravenous Every 12 hours 10/30/21 1538 10/31/21 0951   10/30/21 1700  metroNIDAZOLE (FLAGYL) tablet 500 mg  Status:  Discontinued        500 mg Oral Every 12 hours 10/30/21 1526 10/31/21 1359   10/30/21 1400  vancomycin (VANCOREADY) IVPB 750 mg/150 mL       See Hyperspace for full Linked Orders Report.   750 mg 150 mL/hr over 60 Minutes Intravenous  Once 10/30/21 1305 10/30/21 1515   10/30/21 1315  vancomycin (VANCOCIN) IVPB 1000 mg/200 mL premix  Status:  Discontinued       See Hyperspace for full Linked Orders Report.   1,000 mg 200 mL/hr over 60 Minutes Intravenous  Once 10/30/21 1200 10/30/21 1305   10/30/21 1215  ceFEPIme (MAXIPIME) 2 g in sodium chloride 0.9 % 100 mL IVPB        2 g 200 mL/hr over 30 Minutes Intravenous  Once 10/30/21 1200 10/30/21 1256   10/30/21 1200  vancomycin (VANCOCIN) IVPB 1000 mg/200 mL premix       See Hyperspace for full Linked Orders Report.   1,000 mg 200 mL/hr over 60 Minutes Intravenous  Once 10/30/21 1200 10/30/21 1330       Assessment/Plan: s/p Procedure(s): Lower Extremity Angiography (Right) Seen in the room while patient was on the phone with his wife, with Dr. Georgeann Oppenheim present.  Topic with regarding right foot viability discussed at length. At this point in time we will plan on pursuing right below-knee amputation tomorrow for definitive treatment of this issue.  There certainly is a risk of nonhealing given his diabetes,  etc., but the foot is clearly nonviable, post angiogram demonstrating intact trifurcation, with wide patency above this.  He understands the rationale for pursuing this.  We will plan on doing this tomorrow.  LOS: 4 days   Learta Codding 11/03/2021, 10:05 AM

## 2021-11-03 NOTE — Plan of Care (Signed)

## 2021-11-04 ENCOUNTER — Inpatient Hospital Stay: Payer: Self-pay | Admitting: Anesthesiology

## 2021-11-04 ENCOUNTER — Other Ambulatory Visit: Payer: Self-pay

## 2021-11-04 ENCOUNTER — Encounter
Admission: EM | Disposition: A | Discharge status: Home Health | Payer: Self-pay | Source: Home / Self Care | Attending: Internal Medicine

## 2021-11-04 ENCOUNTER — Inpatient Hospital Stay: Payer: Self-pay

## 2021-11-04 HISTORY — PX: AMPUTATION: SHX166

## 2021-11-04 LAB — CULTURE, BLOOD (ROUTINE X 2)
Culture: NO GROWTH
Culture: NO GROWTH
Special Requests: ADEQUATE
Special Requests: ADEQUATE

## 2021-11-04 LAB — CBC
HCT: 30.1 % — ABNORMAL LOW (ref 39.0–52.0)
Hemoglobin: 10 g/dL — ABNORMAL LOW (ref 13.0–17.0)
MCH: 31 pg (ref 26.0–34.0)
MCHC: 33.2 g/dL (ref 30.0–36.0)
MCV: 93.2 fL (ref 80.0–100.0)
Platelets: 401 10*3/uL — ABNORMAL HIGH (ref 150–400)
RBC: 3.23 MIL/uL — ABNORMAL LOW (ref 4.22–5.81)
RDW: 12.2 % (ref 11.5–15.5)
WBC: 13.4 10*3/uL — ABNORMAL HIGH (ref 4.0–10.5)
nRBC: 0 % (ref 0.0–0.2)

## 2021-11-04 LAB — CBC WITH DIFFERENTIAL/PLATELET
Abs Immature Granulocytes: 0.15 10*3/uL — ABNORMAL HIGH (ref 0.00–0.07)
Basophils Absolute: 0.1 10*3/uL (ref 0.0–0.1)
Basophils Relative: 0 %
Eosinophils Absolute: 0.3 10*3/uL (ref 0.0–0.5)
Eosinophils Relative: 3 %
HCT: 26.4 % — ABNORMAL LOW (ref 39.0–52.0)
Hemoglobin: 8.7 g/dL — ABNORMAL LOW (ref 13.0–17.0)
Immature Granulocytes: 1 %
Lymphocytes Relative: 15 %
Lymphs Abs: 1.6 10*3/uL (ref 0.7–4.0)
MCH: 30.5 pg (ref 26.0–34.0)
MCHC: 33 g/dL (ref 30.0–36.0)
MCV: 92.6 fL (ref 80.0–100.0)
Monocytes Absolute: 0.8 10*3/uL (ref 0.1–1.0)
Monocytes Relative: 7 %
Neutro Abs: 8.2 10*3/uL — ABNORMAL HIGH (ref 1.7–7.7)
Neutrophils Relative %: 74 %
Platelets: 360 10*3/uL (ref 150–400)
RBC: 2.85 MIL/uL — ABNORMAL LOW (ref 4.22–5.81)
RDW: 12.1 % (ref 11.5–15.5)
WBC: 11.1 10*3/uL — ABNORMAL HIGH (ref 4.0–10.5)
nRBC: 0 % (ref 0.0–0.2)

## 2021-11-04 LAB — GLUCOSE, CAPILLARY
Glucose-Capillary: 118 mg/dL — ABNORMAL HIGH (ref 70–99)
Glucose-Capillary: 350 mg/dL — ABNORMAL HIGH (ref 70–99)
Glucose-Capillary: 294 mg/dL — ABNORMAL HIGH (ref 70–99)

## 2021-11-04 LAB — CREATININE, SERUM
Creatinine, Ser: 0.91 mg/dL (ref 0.61–1.24)
GFR, Estimated: >60 mL/min (ref >60)

## 2021-11-04 LAB — BASIC METABOLIC PANEL
Anion gap: 4 — ABNORMAL LOW (ref 5–15)
BUN: 27 mg/dL — ABNORMAL HIGH (ref 6–20)
CO2: 28 mmol/L (ref 22–32)
Calcium: 8.4 mg/dL — ABNORMAL LOW (ref 8.9–10.3)
Chloride: 105 mmol/L (ref 98–111)
Creatinine, Ser: 1.06 mg/dL (ref 0.61–1.24)
GFR, Estimated: >60 mL/min (ref >60)
Glucose, Bld: 112 mg/dL — ABNORMAL HIGH (ref 70–99)
Potassium: 4.4 mmol/L (ref 3.5–5.1)
Sodium: 137 mmol/L (ref 135–145)

## 2021-11-04 LAB — PROTIME-INR
INR: 1.1 (ref 0.8–1.2)
Prothrombin Time: 14 seconds (ref 11.4–15.2)

## 2021-11-04 SURGERY — AMPUTATION BELOW KNEE
Anesthesia: General | Site: Knee | Laterality: Right

## 2021-11-04 MED ORDER — SORBITOL 70 % SOLN: 30.0000 mL | Freq: Every day | Status: DC | PRN

## 2021-11-04 MED ORDER — 0.9 % SODIUM CHLORIDE (POUR BTL) OPTIME
TOPICAL | Status: DC | PRN
  Administered 2021-11-04: 1000 mL

## 2021-11-04 MED ORDER — MIDAZOLAM HCL 2 MG/2ML IJ SOLN
INTRAMUSCULAR | Status: AC
  Administered 2021-11-04: 2 mg via INTRAVENOUS
  Filled 2021-11-04: qty 2

## 2021-11-04 MED ORDER — GUAIFENESIN-DM 100-10 MG/5ML PO SYRP: 15.0000 mL | ORAL_SOLUTION | ORAL | Status: DC | PRN

## 2021-11-04 MED ORDER — OXYCODONE HCL 5 MG/5ML PO SOLN: 5.0000 mg | Freq: Once | ORAL | Status: DC | PRN

## 2021-11-04 MED ORDER — DEXAMETHASONE SODIUM PHOSPHATE 10 MG/ML IJ SOLN
INTRAMUSCULAR | Status: DC | PRN
  Administered 2021-11-04: 5 mg via INTRAVENOUS

## 2021-11-04 MED ORDER — GLYCOPYRROLATE 0.2 MG/ML IJ SOLN
INTRAMUSCULAR | Status: DC | PRN
  Administered 2021-11-04: .2 mg via INTRAVENOUS

## 2021-11-04 MED ORDER — LIDOCAINE HCL (PF) 1 % IJ SOLN
INTRAMUSCULAR | Status: DC | PRN
  Administered 2021-11-04 (×2): 2 mL

## 2021-11-04 MED ORDER — SODIUM CHLORIDE 0.9 % IV SOLN: INTRAVENOUS | Status: DC | PRN

## 2021-11-04 MED ORDER — ONDANSETRON HCL 4 MG/2ML IJ SOLN: 4.0000 mg | Freq: Once | INTRAMUSCULAR | Status: DC | PRN

## 2021-11-04 MED ORDER — KETAMINE HCL 10 MG/ML IJ SOLN
INTRAMUSCULAR | Status: DC | PRN
  Administered 2021-11-04: 20 mg via INTRAVENOUS

## 2021-11-04 MED ORDER — PHENYLEPHRINE HCL (PRESSORS) 10 MG/ML IV SOLN
INTRAVENOUS | Status: AC
  Filled 2021-11-04: qty 1

## 2021-11-04 MED ORDER — PROPOFOL 10 MG/ML IV BOLUS
INTRAVENOUS | Status: DC | PRN
  Administered 2021-11-04: 150 mg via INTRAVENOUS

## 2021-11-04 MED ORDER — PHENYLEPHRINE 40 MCG/ML (10ML) SYRINGE FOR IV PUSH (FOR BLOOD PRESSURE SUPPORT)
PREFILLED_SYRINGE | INTRAVENOUS | Status: DC | PRN
  Administered 2021-11-04: 80 ug via INTRAVENOUS

## 2021-11-04 MED ORDER — POTASSIUM CHLORIDE CRYS ER 20 MEQ PO TBCR: 20.0000 meq | EXTENDED_RELEASE_TABLET | Freq: Every day | ORAL | Status: DC | PRN

## 2021-11-04 MED ORDER — FENTANYL CITRATE (PF) 100 MCG/2ML IJ SOLN
INTRAMUSCULAR | Status: AC
  Administered 2021-11-04: 50 ug via INTRAVENOUS
  Filled 2021-11-04: qty 2

## 2021-11-04 MED ORDER — FAMOTIDINE IN NACL 20-0.9 MG/50ML-% IV SOLN
20.0000 mg | Freq: Two times a day (BID) | INTRAVENOUS | Status: DC
  Administered 2021-11-04 – 2021-11-06 (×6): 20 mg via INTRAVENOUS
  Filled 2021-11-04 (×7): qty 50

## 2021-11-04 MED ORDER — HYDRALAZINE HCL 20 MG/ML IJ SOLN
5.0000 mg | INTRAMUSCULAR | Status: DC | PRN
  Administered 2021-11-06: 5 mg via INTRAVENOUS
  Filled 2021-11-04: qty 1

## 2021-11-04 MED ORDER — SEVOFLURANE IN SOLN
RESPIRATORY_TRACT | Status: AC
  Filled 2021-11-04: qty 250

## 2021-11-04 MED ORDER — ROPIVACAINE HCL 5 MG/ML IJ SOLN
INTRAMUSCULAR | Status: AC
  Filled 2021-11-04: qty 30

## 2021-11-04 MED ORDER — MORPHINE SULFATE (PF) 2 MG/ML IV SOLN
0.5000 mg | INTRAVENOUS | Status: DC | PRN
  Administered 2021-11-05: 1 mg via INTRAVENOUS
  Filled 2021-11-04: qty 1

## 2021-11-04 MED ORDER — FENTANYL CITRATE (PF) 100 MCG/2ML IJ SOLN
INTRAMUSCULAR | Status: AC
Start: 2021-11-04 — End: ?
  Filled 2021-11-04: qty 2

## 2021-11-04 MED ORDER — MIDAZOLAM HCL 2 MG/2ML IJ SOLN: 2.0000 mg | Freq: Once | INTRAMUSCULAR | Status: AC

## 2021-11-04 MED ORDER — FENTANYL CITRATE (PF) 100 MCG/2ML IJ SOLN
100.0000 ug | Freq: Once | INTRAMUSCULAR | Status: AC
  Administered 2021-11-04: 50 ug via INTRAVENOUS

## 2021-11-04 MED ORDER — PROPOFOL 10 MG/ML IV BOLUS
INTRAVENOUS | Status: AC
Start: 2021-11-04 — End: ?
  Filled 2021-11-04: qty 20

## 2021-11-04 MED ORDER — LABETALOL HCL 5 MG/ML IV SOLN: 10.0000 mg | INTRAVENOUS | Status: DC | PRN

## 2021-11-04 MED ORDER — SUGAMMADEX SODIUM 200 MG/2ML IV SOLN
INTRAVENOUS | Status: DC | PRN
Start: 2021-11-04 — End: 2021-11-04
  Administered 2021-11-04: 200 mg via INTRAVENOUS

## 2021-11-04 MED ORDER — PHENOL 1.4 % MT LIQD
1.0000 | OROMUCOSAL | Status: DC | PRN
  Filled 2021-11-04: qty 177

## 2021-11-04 MED ORDER — METOPROLOL TARTRATE 5 MG/5ML IV SOLN: 2.0000 mg | INTRAVENOUS | Status: DC | PRN

## 2021-11-04 MED ORDER — DOCUSATE SODIUM 100 MG PO CAPS
100.0000 mg | ORAL_CAPSULE | Freq: Every day | ORAL | Status: DC
  Administered 2021-11-05 – 2021-11-07 (×3): 100 mg via ORAL
  Filled 2021-11-04 (×3): qty 1

## 2021-11-04 MED ORDER — ROCURONIUM BROMIDE 100 MG/10ML IV SOLN
INTRAVENOUS | Status: DC | PRN
  Administered 2021-11-04: 20 mg via INTRAVENOUS
  Administered 2021-11-04: 40 mg via INTRAVENOUS

## 2021-11-04 MED ORDER — SODIUM CHLORIDE 0.9 % IV SOLN: INTRAVENOUS | Status: DC

## 2021-11-04 MED ORDER — ALUM & MAG HYDROXIDE-SIMETH 200-200-20 MG/5ML PO SUSP: 15.0000 mL | ORAL | Status: DC | PRN

## 2021-11-04 MED ORDER — MORPHINE SULFATE (PF) 4 MG/ML IV SOLN: 1.0000 mg | INTRAVENOUS | Status: DC | PRN

## 2021-11-04 MED ORDER — ONDANSETRON HCL 4 MG/2ML IJ SOLN
INTRAMUSCULAR | Status: DC | PRN
  Administered 2021-11-04: 4 mg via INTRAVENOUS

## 2021-11-04 MED ORDER — HYDROCODONE-ACETAMINOPHEN 5-325 MG PO TABS
1.0000 | ORAL_TABLET | ORAL | Status: DC | PRN
  Administered 2021-11-04 – 2021-11-07 (×11): 2 via ORAL
  Filled 2021-11-04 (×12): qty 2

## 2021-11-04 MED ORDER — POLYETHYLENE GLYCOL 3350 17 G PO PACK
17.0000 g | PACK | Freq: Every day | ORAL | Status: DC | PRN
  Administered 2021-11-06: 17 g via ORAL
  Filled 2021-11-04 (×2): qty 1

## 2021-11-04 MED ORDER — OXYCODONE HCL 5 MG PO TABS: 5.0000 mg | ORAL_TABLET | Freq: Once | ORAL | Status: DC | PRN

## 2021-11-04 MED ORDER — PHENYLEPHRINE HCL-NACL 20-0.9 MG/250ML-% IV SOLN
INTRAVENOUS | Status: DC | PRN
  Administered 2021-11-04: 50 ug/min via INTRAVENOUS

## 2021-11-04 MED ORDER — ROPIVACAINE HCL 5 MG/ML IJ SOLN
INTRAMUSCULAR | Status: DC | PRN
  Administered 2021-11-04: 10 mg via EPIDURAL
  Administered 2021-11-04: 7 mg via EPIDURAL
  Administered 2021-11-04 (×2): 10 mg via EPIDURAL

## 2021-11-04 MED ORDER — ACETAMINOPHEN 500 MG PO TABS
500.0000 mg | ORAL_TABLET | Freq: Four times a day (QID) | ORAL | Status: AC
  Administered 2021-11-04 – 2021-11-05 (×4): 500 mg via ORAL
  Filled 2021-11-04 (×4): qty 1

## 2021-11-04 MED ORDER — LIDOCAINE HCL (PF) 1 % IJ SOLN
INTRAMUSCULAR | Status: AC
  Filled 2021-11-04: qty 5

## 2021-11-04 MED ORDER — KETAMINE HCL 50 MG/5ML IJ SOSY
PREFILLED_SYRINGE | INTRAMUSCULAR | Status: AC
  Filled 2021-11-04: qty 5

## 2021-11-04 MED ORDER — HEPARIN SODIUM (PORCINE) 5000 UNIT/ML IJ SOLN
5000.0000 [IU] | Freq: Three times a day (TID) | INTRAMUSCULAR | Status: DC
  Administered 2021-11-04 – 2021-11-07 (×10): 5000 [IU] via SUBCUTANEOUS
  Filled 2021-11-04 (×10): qty 1

## 2021-11-04 MED ORDER — FENTANYL CITRATE (PF) 100 MCG/2ML IJ SOLN: 100.0000 ug | Freq: Once | INTRAMUSCULAR | Status: AC

## 2021-11-04 MED ORDER — FENTANYL CITRATE (PF) 100 MCG/2ML IJ SOLN
INTRAMUSCULAR | Status: AC
  Filled 2021-11-04: qty 2

## 2021-11-04 MED ORDER — MIDAZOLAM HCL 2 MG/2ML IJ SOLN
INTRAMUSCULAR | Status: AC
  Filled 2021-11-04: qty 2

## 2021-11-04 MED ORDER — MAGNESIUM SULFATE 2 GM/50ML IV SOLN: 2.0000 g | Freq: Every day | INTRAVENOUS | Status: DC | PRN

## 2021-11-04 MED ORDER — ACETAMINOPHEN 325 MG PO TABS: 325.0000 mg | ORAL_TABLET | Freq: Four times a day (QID) | ORAL | Status: DC | PRN

## 2021-11-04 MED ORDER — LIDOCAINE HCL (CARDIAC) PF 100 MG/5ML IV SOSY
PREFILLED_SYRINGE | INTRAVENOUS | Status: DC | PRN
Start: 2021-11-04 — End: 2021-11-04
  Administered 2021-11-04: 100 mg via INTRAVENOUS

## 2021-11-04 SURGICAL SUPPLY — 43 items
APL PRP STRL LF DISP 70% ISPRP (MISCELLANEOUS) ×1
BLADE SAW GIGLI 510 (BLADE) ×1 IMPLANT
BLADE SAW SAG 25.4X90 (BLADE) ×1 IMPLANT
BNDG CMPR STD VLCR NS LF 5.8X6 (GAUZE/BANDAGES/DRESSINGS) ×1
BNDG COHESIVE 4X5 TAN ST LF (GAUZE/BANDAGES/DRESSINGS) ×2 IMPLANT
BNDG ELASTIC 4X5.8 VLCR STR LF (GAUZE/BANDAGES/DRESSINGS) ×1 IMPLANT
BNDG ELASTIC 6X5.8 VLCR NS LF (GAUZE/BANDAGES/DRESSINGS) ×2 IMPLANT
BNDG GAUZE ELAST 4 BULKY (GAUZE/BANDAGES/DRESSINGS) ×4 IMPLANT
BRUSH SCRUB EZ  4% CHG (MISCELLANEOUS) ×1
BRUSH SCRUB EZ 4% CHG (MISCELLANEOUS) ×1 IMPLANT
CHLORAPREP W/TINT 26 (MISCELLANEOUS) ×2 IMPLANT
DRAPE INCISE IOBAN 66X45 STRL (DRAPES) ×1 IMPLANT
ELECT CAUTERY BLADE 6.4 (BLADE) ×2 IMPLANT
ELECT REM PT RETURN 9FT ADLT (ELECTROSURGICAL) ×2
ELECTRODE REM PT RTRN 9FT ADLT (ELECTROSURGICAL) ×1 IMPLANT
GAUZE SPONGE 4X4 12PLY STRL (GAUZE/BANDAGES/DRESSINGS) ×1 IMPLANT
GAUZE XEROFORM 1X8 LF (GAUZE/BANDAGES/DRESSINGS) ×3 IMPLANT
GLOVE SURG SYN 7.0 (GLOVE) ×2 IMPLANT
GLOVE SURG SYN 7.0 PF PI (GLOVE) ×1 IMPLANT
GOWN STRL REUS W/ TWL LRG LVL3 (GOWN DISPOSABLE) ×2 IMPLANT
GOWN STRL REUS W/TWL LRG LVL3 (GOWN DISPOSABLE) ×4
HANDLE YANKAUER SUCT BULB TIP (MISCELLANEOUS) ×2 IMPLANT
KIT TURNOVER KIT A (KITS) ×2 IMPLANT
LABEL OR SOLS (LABEL) ×2 IMPLANT
MANIFOLD NEPTUNE II (INSTRUMENTS) ×2 IMPLANT
NS IRRIG 1000ML POUR BTL (IV SOLUTION) ×2 IMPLANT
PACK EXTREMITY ARMC (MISCELLANEOUS) ×2 IMPLANT
PAD ABD DERMACEA PRESS 5X9 (GAUZE/BANDAGES/DRESSINGS) ×3 IMPLANT
PAD CAST 4YDX4 CTTN HI CHSV (CAST SUPPLIES) IMPLANT
PAD PREP 24X41 OB/GYN DISP (PERSONAL CARE ITEMS) ×2 IMPLANT
PADDING CAST COTTON 4X4 STRL (CAST SUPPLIES) ×2
SPONGE T-LAP 18X18 ~~LOC~~+RFID (SPONGE) ×3 IMPLANT
STAPLER SKIN PROX 35W (STAPLE) ×2 IMPLANT
STOCKINETTE M/LG 89821 (MISCELLANEOUS) ×2 IMPLANT
SUT SILK 2 0 (SUTURE) ×2
SUT SILK 2 0 SH (SUTURE) ×4 IMPLANT
SUT SILK 2-0 18XBRD TIE 12 (SUTURE) ×1 IMPLANT
SUT SILK 3 0 (SUTURE) ×2
SUT SILK 3-0 18XBRD TIE 12 (SUTURE) ×1 IMPLANT
SUT VIC AB 0 CT1 36 (SUTURE) ×5 IMPLANT
SUT VIC AB 2-0 CT1 (SUTURE) ×4 IMPLANT
SUT VICRYL+ 3-0 36IN CT-1 (SUTURE) ×1 IMPLANT
WATER STERILE IRR 500ML POUR (IV SOLUTION) ×2 IMPLANT

## 2021-11-04 NOTE — Anesthesia Preprocedure Evaluation (Addendum)
Anesthesia Evaluation  ?Patient identified by MRN, date of birth, ID band ?Patient awake ? ? ? ?Reviewed: ?Allergy & Precautions, H&P , NPO status , Patient's Chart, lab work & pertinent test results ? ?History of Anesthesia Complications ?Negative for: history of anesthetic complications ? ?Airway ?Mallampati: III ? ?TM Distance: >3 FB ?Neck ROM: full ? ? ? Dental ? ?(+)  ?  ?Pulmonary ?neg pulmonary ROS, neg sleep apnea, neg COPD, Patient abstained from smoking.,  ?  ?breath sounds clear to auscultation ? ? ? ? ? ? Cardiovascular ?hypertension, (-) angina(-) Past MI and (-) Cardiac Stents (-) dysrhythmias  ?Rhythm:regular Rate:Normal ? ?HLD ?  ?Neuro/Psych ?PSYCHIATRIC DISORDERS Depression Peripheral neuropathy ? ?negative neurological ROS ? negative psych ROS  ? GI/Hepatic ?negative GI ROS, (+)  ?  ? substance abuse ? alcohol use,   ?Endo/Other  ?diabetes, Poorly Controlled, Type 2 ? Renal/GU ?Renal disease (AKI)  ? ?  ?Musculoskeletal ?Non healing diabetic foot ulcer with osteomyelitis and complicated soft tissue infection ? ?S/f BKA  ? Abdominal ?  ?Peds ? Hematology ? ?(+) Blood dyscrasia (Hgb 8.7), anemia ,   ?Anesthesia Other Findings ?Past Medical History: ?No date: Depression ?No date: Diabetes mellitus without complication (HCC) ?No date: High cholesterol ? ?Past Surgical History: ?08/14/2021: AMPUTATION; Right ?    Comment:  Procedure: AMPUTATION FOOT;  Surgeon: Felecia Shelling,  ?             DPM;  Location: [facility] ORS;  Service: Podiatry;   ?             Laterality: Right; ?No date: BACK SURGERY ?08/14/2021: INCISION AND DRAINAGE; Right ?    Comment:  Procedure: INCISION AND DRAINAGE;  Surgeon: Gala Lewandowsky ?             M, DPM;  Location: [facility] ORS;  Service: Podiatry;   ?             Laterality: Right; ?05/06/2021: IRRIGATION AND DEBRIDEMENT FOOT; Right ?    Comment:  Procedure: IRRIGATION AND DEBRIDEMENT FOOT;  Surgeon:  ?             [name], DPM;  Location: [facility]  ORS;  Service:  ?             Podiatry;  Laterality: Right; ?11/02/2021: LOWER EXTREMITY ANGIOGRAPHY; Right ?    Comment:  Procedure: Lower Extremity Angiography;  Surgeon: Wyn Quaker,  ?             Marlow Baars, MD;  Location: [facility] INVASIVE CV LAB;  Service:  ?             Cardiovascular;  Laterality: Right; ?05/09/2021: TRANSMETATARSAL AMPUTATION; Right ?    Comment:  Procedure: TRANSMETATARSAL AMPUTATION;  Surgeon:  ?             [name], DPM;  Location: [facility] ORS;  Service:  ?             Podiatry;  Laterality: Right; ? ?BMI   ? Body Mass Index: 27.29 kg/m?  ?  ? ? Reproductive/Obstetrics ?negative OB ROS ? ?  ? ? ? ? ? ? ? ? ? ? ? ? ? ?  ?  ? ? ? ? ? ? ?Anesthesia Physical ?Anesthesia Plan ? ?ASA: 3 ? ?Anesthesia Plan: General  ? ?Post-op Pain Management: Regional block* and Ketamine IV*  ? ?Induction: Intravenous ? ?PONV Risk Score and Plan: Dexamethasone and Ondansetron ? ?Airway Management Planned: Oral  ETT ? ?Additional Equipment:  ? ?Intra-op Plan:  ? ?Post-operative Plan: Extubation in OR ? ?Informed Consent: I have reviewed the patients History and Physical, chart, labs and discussed the procedure including the risks, benefits and alternatives for the proposed anesthesia with the patient or authorized representative who has indicated his/her understanding and acceptance.  ? ? ? ?Dental Advisory Given ? ?Plan Discussed with: Anesthesiologist, CRNA and Surgeon ? ?Anesthesia Plan Comments: ( ?Pre-op pop/saph block ?GETA ?Standard ASA monitors ?Risks/benefits discussed and pt agrees to anesthetic plan. ? ?K Jacole Capley)  ? ? ? ? ? ?Anesthesia Quick Evaluation ? ? ?

## 2021-11-04 NOTE — Anesthesia Procedure Notes (Addendum)
Anesthesia Regional Block: Popliteal block  ? ?Pre-Anesthetic Checklist: , timeout performed,  Correct Patient, Correct Site, Correct Laterality,  Correct Procedure, Correct Position, site marked,  Risks and benefits discussed,  Surgical consent,  Pre-op evaluation,  At surgeon's request and post-op pain management ? ?Laterality: Lower and Right ? ?Prep: chloraprep     ?  ?Needles:  ?Injection technique: Single-shot ? ?Needle Type: Echogenic Needle   ? ? ?Needle Length: 9cm  ?Needle Gauge: 21  ? ? ? ?Additional Needles: ? ? ?Narrative:  ?Start time: 11/04/2021 8:17 AM ?End time: 11/04/2021 8:23 AM ?Injection made incrementally with aspirations every 5 mL. ? ?Performed by: Personally  ?Anesthesiologist: Karleen Hampshire, MD ? ?Additional Notes: ?Ultrasound guided ? ? ?Patient consented for risk and benefits of nerve block including but not limited to nerve damage, failed block, bleeding and infection.  Patient voiced understanding. ? ?Functioning IV was confirmed and monitors were applied.  Timeout done prior to procedure and prior to any sedation being given to the patient.  Patient confirmed procedure site prior to any sedation given to the patient.  A 23mm 22ga Stimuplex needle was used. Sterile prep,hand hygiene and sterile gloves were used.  Minimal sedation used for procedure.  No paresthesia endorsed by patient during the procedure.  Negative aspiration and negative test dose prior to incremental administration of local anesthetic. The patient tolerated the procedure well with no immediate complications. ? ?30 mL ropivacaine 0.5% ? ? ? ?

## 2021-11-04 NOTE — Progress Notes (Signed)
PROGRESS NOTE    Jesus Flowers  UEA:[id number] DOB: Mar 19, 1966 DOA: 10/30/2021 PCP: [facility]    Brief Narrative:  56 y.o. 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 Patient then returns 07/2021 with foot infection and diagnosed with residual osteomyelitis at site of R foot TMA. 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 Patient now returns to ed after follow with podiatry 10/30/22 with diagnosis of "severe dehiscence and necrosis of wound and concern for osteomyelitis of the remaining talus and calcaneus, probable septic arthritis of  subtalar and as well as ankle." Per podiatry noted. Per podiatry will need BKA .Patient was referred to ED yesterday but presented today. Per patient he noted worsening pain s/p fall and injury 3-4 days ago.  He notes increase swelling drainage and odor. He notes associated chills but no fever, n/v/d. ON further ros no sob/ chest pain / cough / uri signs and symptoms. He notes he is complaint with is diabetic medication.  Podiatry consulted.  Nothing to offer from their standpoint.  Vascular surgery engaged.  Discussed with patient about high likelihood of BKA.  Patient aware and in agreement.  Infectious disease consulted for recommendations.    Status post angiogram 4/7.  On 4/8 pain well controlled.  Patient overall stable.  Case discussed with vascular surgery Dr. Renne Crigler at bedside.  Plan for BKA/9  Assessment & Plan:   Principal Problem:   Diabetic foot infection (HCC)  Osteomyelitis right ankle Wound dehiscence with necrosis Complicated soft tissue infection Patient has a history of surgical amputations of the affected extremity Unfortunately wound is dehisced MRI with evidence of  osteomyelitis Podiatry cannot offer anything else Vascular surgery consulted Status post lower extremity angiogram 4/7 ID consulted Plan: Continue IV Unasyn N.p.o. today BKA today As needed pain control  Diabetes mellitus type 2 with hyperglycemia Gustatory hyperhidrosis Basal bolus regimen Carb modified diet Goal blood sugar while hospitalized 140-180 Patient endorses sweating anytime he tries to eat.  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 We will discuss possibility of topical glycopyrrolate at time of discharge Cost and medication availability may be a barrier  Acute kidney injury Appears resolved Suspect prerenal azotemia Monitor kidney function while on antibiotics  Hyponatremia Likely related to elevated blood sugars Resolved with administration of IVF Monitor labs  History of alcohol use No evidence of withdrawal No indication for CIWA protocol  Hyperlipidemia PTA statin  Depression PTA fluoxetine  Hypertension PTA lisinopril    DVT prophylaxis: SQ Lovenox Code Status: Full code Family Communication: Patient's daughter via phone 4/7 Disposition Plan: Status is: Inpatient Remains inpatient appropriate because: Acute osteomyelitis associated with wound dehiscence right lower extremity.  Extremity nonviable.  Plan for BKA 4/9   Level of care: Med-Surg  Consultants:  Podiatry  Procedures:  None  Antimicrobials: Unasyn   Subjective: Seen and examined prior to going to the OR.  In good spirits.  Hemodynamically stable.  Pain well controlled  Objective: Vitals:   11/03/21 2017 11/04/21 0434 11/04/21 0739 11/04/21 0830  BP: (!) 163/96 (!) 150/90 (!) 152/94 (!) 155/92  Pulse: 73 69 75   Resp: 18 16 16    Temp: 98.3 F (36.8 C) 97.9 F (36.6 C) 98.4 F (36.9 C)  TempSrc:      SpO2: 94% 96% 99%   Weight:      Height:        Intake/Output Summary  (Last 24 hours) at 11/04/2021 0953 Last data filed at 11/04/2021 0948 Gross per 24 hour  Intake 1160 ml  Output 50 ml  Net 1110 ml   Filed Weights   10/31/21 0500 11/02/21 0500 11/03/21 0500  Weight: 78.7 kg 78.9 kg 76.7 kg    Examination:  General exam: No acute distress Respiratory system: Lungs clear.  Normal work of breathing.  Room air Cardiovascular system: S1-S2, RRR, no murmurs, no pedal edema Gastrointestinal system: Soft, NT/ND, normal bowel sounds Central nervous system: Alert and oriented. No focal neurological deficits. Extremities: Right foot in surgical dressings.  Not removed.  Pain well controlled Skin: No rashes, lesions or ulcers Psychiatry: Judgement and insight appear normal. Mood & affect appropriate.     Data Reviewed: I have personally reviewed following labs and imaging studies  CBC: Recent Labs  Lab 10/30/21 1038 10/30/21 1839 10/31/21 0445 11/04/21 0410  WBC 11.8* 10.7* 9.5 11.1*  NEUTROABS 9.9*  --   --  8.2*  HGB 10.8* 9.8* 8.8* 8.7*  HCT 32.2* 28.9* 26.6* 26.4*  MCV 91.5 91.2 91.4 92.6  PLT 352 338 296 360   Basic Metabolic Panel: Recent Labs  Lab 10/30/21 1038 10/30/21 1839 10/31/21 0445 11/01/21 0624 11/02/21 0027 11/04/21 0410  NA 128* 133* 136  --   --  137  K 5.2* 4.5 4.4  --   --  4.4  CL 94* 100 105  --   --  105  CO2 25 25 24   --   --  28  GLUCOSE 557* 271* 110*  --   --  112*  BUN 42* 35* 30*  --  28* 27*  CREATININE 1.57* 1.22 1.01 1.08 1.18 1.06  CALCIUM 8.7* 8.5* 8.4*  --   --  8.4*  MG  --  2.1  --   --   --   --   PHOS  --  3.0  --   --   --   --    GFR: Estimated Creatinine Clearance: 75.9 mL/min (by C-G formula based on SCr of 1.06 mg/dL). Liver Function Tests: Recent Labs  Lab 10/30/21 1038 10/31/21 0445  AST 27 15  ALT 34 23  ALKPHOS 289* 176*  BILITOT 0.5 0.5  PROT 7.8 6.5  ALBUMIN 2.8* 2.4*   No results for input(s): LIPASE, AMYLASE in the last 168 hours. No results for input(s): AMMONIA in the  last 168 hours. Coagulation Profile: Recent Labs  Lab 11/04/21 0410  INR 1.1   Cardiac Enzymes: No results for input(s): CKTOTAL, CKMB, CKMBINDEX, TROPONINI in the last 168 hours. BNP (last 3 results) No results for input(s): PROBNP in the last 8760 hours. HbA1C: No results for input(s): HGBA1C in the last 72 hours.  CBG: Recent Labs  Lab 11/02/21 2313 11/03/21 0811 11/03/21 1157 11/03/21 1710 11/03/21 2218  GLUCAP 207* 125* 132* 141* 82   Lipid Profile: No results for input(s): CHOL, HDL, LDLCALC, TRIG, CHOLHDL, LDLDIRECT in the last 72 hours. Thyroid Function Tests: No results for input(s): TSH, T4TOTAL, FREET4, T3FREE, THYROIDAB in the last 72 hours. Anemia Panel: No results for input(s): VITAMINB12, FOLATE, FERRITIN, TIBC, IRON, RETICCTPCT in the last 72 hours. Sepsis Labs: Recent Labs  Lab 10/30/21 1038 10/30/21 1209  LATICACIDVEN 1.0 1.0    Recent Results (from the past 240 hour(s))  WOUND CULTURE     Status: Abnormal   Collection Time: 10/29/21 11:25 AM  Result Value Ref Range Status   Gram Stain Result Final report  Final   Organism ID, Bacteria Comment  Final    Comment: No white blood cells seen.   Organism ID, Bacteria Comment  Final    Comment: Few gram positive cocci   Aerobic Bacterial Culture Final report (A)  Final   Organism ID, Bacteria Routine flora  Final    Comment: Light growth   Organism ID, Bacteria Staphylococcus aureus (A)  Final    Comment: Based on susceptibility to oxacillin this isolate would be susceptible to: *Penicillinase-stable penicillins, such as:   Cloxacillin, Dicloxacillin, Nafcillin *Beta-lactam combination agents, such as:   Amoxicillin-clavulanic acid, Ampicillin-sulbactam,   Piperacillin-tazobactam *Oral cephems, such as:   Cefaclor, Cefdinir, Cefpodoxime, Cefprozil, Cefuroxime,   Cephalexin, Loracarbef *Parenteral cephems, such as:   Cefazolin, Cefepime, Cefotaxime, Cefotetan, Ceftaroline,   Ceftizoxime,  Ceftriaxone, Cefuroxime *Carbapenems, such as:   Doripenem, Ertapenem, Imipenem, Meropenem Light growth    Antimicrobial Susceptibility Comment  Final    Comment:       ** S = Susceptible; I = Intermediate; R = Resistant **                    P = Positive; N = Negative             MICS are expressed in micrograms per mL    Antibiotic                 RSLT#1    RSLT#2    RSLT#3    RSLT#4 Ciprofloxacin                            S Clindamycin                              S Erythromycin                             S Gentamicin                               R Levofloxacin                             S Linezolid                                S Moxifloxacin                             S Oxacillin                                S Penicillin                               R Quinupristin/Dalfopristin                S Rifampin  S Tetracycline                             S Trimethoprim/Sulfa                       S Vancomycin                               S   Culture, blood (routine x 2)     Status: None   Collection Time: 10/30/21 12:09 PM   Specimen: BLOOD  Result Value Ref Range Status   Specimen Description BLOOD BLOOD RIGHT FOREARM  Final   Special Requests   Final    BOTTLES DRAWN AEROBIC AND ANAEROBIC Blood Culture adequate volume   Culture   Final    NO GROWTH 5 DAYS Performed at [facility], [address], [place], [place] [address]    Report Status 11/04/2021 FINAL  Final  Culture, blood (routine x 2)     Status: None   Collection Time: 10/30/21 12:09 PM   Specimen: BLOOD  Result Value Ref Range Status   Specimen Description BLOOD BLOOD RIGHT HAND  Final   Special Requests   Final    BOTTLES DRAWN AEROBIC AND ANAEROBIC Blood Culture adequate volume   Culture   Final    NO GROWTH 5 DAYS Performed at [facility], [address] [place], [place] [address]    Report Status 11/04/2021 FINAL  Final         Radiology  Studies: [place] OR NERVE BLOCK-IMAGE ONLY [facility])  Result Date: 11/04/2021 There is no interpretation for this exam.  This order is for images obtained during a surgical procedure.  Please See "Surgeries" Tab for more information regarding the procedure.        Scheduled Meds:  [MAR Hold] vitamin C  500 mg Oral BID   [MAR Hold] FLUoxetine  40 mg Oral Daily   [MAR Hold] gabapentin  300 mg Oral BID   [MAR Hold] insulin aspart  0-20 Units Subcutaneous TID WC   [MAR Hold] insulin aspart  3 Units Subcutaneous TID WC   [MAR Hold] insulin detemir  20 Units Subcutaneous BID   [MAR Hold] lisinopril  20 mg Oral Daily   [MAR Hold] multivitamin with minerals  1 tablet Oral Daily   [MAR Hold] nutrition supplement (JUVEN)  1 packet Oral BID BM   [MAR Hold] pravastatin  20 mg Oral q1800   [MAR Hold] Ensure Max Protein  11 oz Oral QHS   [MAR Hold] sodium chloride flush  3 mL Intravenous Q12H   [MAR Hold] traZODone  50 mg Oral QHS   [MAR Hold] zinc sulfate  220 mg Oral Daily   Continuous Infusions:  [MAR Hold] ampicillin-sulbactam (UNASYN) IV 3 g (11/04/21 0542)     LOS: 5 days     Tresa Moore, MD [facility]   If 7PM-7AM, please contact night-coverage  11/04/2021, 9:53 AM

## 2021-11-04 NOTE — Op Note (Signed)
? ?  OPERATIVE NOTE ? ? ?PROCEDURE: ?Right below-the-knee amputation ? ?PRE-OPERATIVE DIAGNOSIS: Right foot gangrene ? ?POST-OPERATIVE DIAGNOSIS: same as above ? ?SURGEON: Learta Codding, MD ? ?ASSISTANT(S): none ? ?ANESTHESIA: block ? ?ESTIMATED BLOOD LOSS: 50 cc ?Ivf: 700 cc ?Drains: none ? ?FINDING(S): ?Subcutaneous edema with viable muscle groups ?No tension on repair ? ?SPECIMEN(S):  Right below-the-knee amputation ? ?INDICATIONS:   ?Jesus Flowers is a 56 y.o. male who presents with right leg gangrene.  The patient is scheduled for a right below-the-knee amputation.  I discussed in depth with the patient the risks, benefits, and alternatives to this procedure.  The patient is aware that the risk of this operation included but are not limited to:  bleeding, infection, myocardial infarction, stroke, death, failure to heal amputation wound, and possible need for more proximal amputation.  The patient is aware of the risks and agrees proceed forward with the procedure. ? ?DESCRIPTION: ? After full informed written consent was obtained from the patient, the patient was brought back to the operating room, and placed supine upon the operating table.  Prior to induction, the patient received IV antibiotics.  The patient was then prepped and draped in the standard fashion for a below-the-knee amputation.  After obtaining adequate anesthesia, the patient was prepped and draped in the standard fashion for a right below-the-knee amputation.  I marked out the anterior incision two finger breadths below the tibial tuberosity and then the marked out a posterior flap that was one third of the circumference of the calf in length.   I made the incisions for these flaps, and then dissected through the subcutaneous tissue, fascia, and muscle anteriorly.  I elevated  the periosteal tissue superiorly so that the tibia was about 3-4 cm shorter than the anterior skin flap.  I then transected the tibia with a Gigli saw and then took a  wedge off the tibia anteriorly with the power saw.  Then I smoothed out the rough edges.  In a similar fashion, I cut back the fibula about two centimeters higher than the level of the tibia with a bone cutter.  I put a bone hook into the distal tibia and then used a cautery to sharply develop a tissue plane through the muscle along the fibula.  In such fashion, the posterior flap was developed.  At this point, the specimen was passed off the field as the below-the-knee amputation.  At this point, I clamped all visibly bleeding arteries and veins using a combination of suture ligation with Silk suture and electrocautery.  Bleeding continued to be controlled with electrocautery and suture ligature.  The stump was washed off with sterile normal saline and no further active bleeding was noted.  I reapproximated the anterior and posterior fascia  with interrupted stitches of 0 Vicryl.  This was completed along the entire length of anterior and posterior fascia until there were no more loose space in the fascial line. I then placed a layer of 2-0 Vicryl sutures in the subcutaneous tissue. The skin was then  reapproximated with staples.  The stump was washed off and dried.  The incision was dressed with Xeroform and  then fluffs were applied.  Kerlix was wrapped around the leg and then gently an ACE wrap was applied.   ? ?COMPLICATIONS: none ? ?CONDITION: stable ? ? ?Learta Codding ? ?11/04/2021, 10:13 AM ? ? ? ?This note was created with Dragon Medical transcription system. Any errors in dictation are purely unintentional.  ?

## 2021-11-04 NOTE — Anesthesia Postprocedure Evaluation (Signed)
Anesthesia Post Note ? ?Patient: Jesus Flowers ? ?Procedure(s) Performed: AMPUTATION BELOW KNEE (Right: Knee) ? ?Patient location during evaluation: PACU ?Anesthesia Type: General ?Level of consciousness: awake and alert ?Pain management: pain level controlled ?Vital Signs Assessment: post-procedure vital signs reviewed and stable ?Respiratory status: spontaneous breathing, nonlabored ventilation, respiratory function stable and patient connected to nasal cannula oxygen ?Cardiovascular status: blood pressure returned to baseline and stable ?Postop Assessment: no apparent nausea or vomiting ?Anesthetic complications: no ? ? ?No notable events documented. ? ? ?Last Vitals:  ?Vitals:  ? 11/04/21 1100 11/04/21 1115  ?BP: (!) 139/96 (!) 146/96  ?Pulse: 78 77  ?Resp: 15 18  ?Temp: (!) 36.3 ?C   ?SpO2: 94% 93%  ?  ?Last Pain:  ?Vitals:  ? 11/04/21 1115  ?TempSrc:   ?PainSc: 5   ? ? ?  ?  ?  ?  ?  ?  ? ?Karleen Hampshire ? ? ? ? ?

## 2021-11-04 NOTE — Anesthesia Procedure Notes (Addendum)
Anesthesia Regional Block: Adductor canal block  ? ?Pre-Anesthetic Checklist: , timeout performed,  Correct Patient, Correct Site, Correct Laterality,  Correct Procedure, Correct Position, site marked,  Risks and benefits discussed,  Surgical consent,  Pre-op evaluation,  At surgeon's request and post-op pain management ? ?Laterality: Lower and Right ? ?Prep: chloraprep     ?  ?Needles:  ?Injection technique: Single-shot ? ?Needle Type: Echogenic Needle   ? ? ?Needle Length: 9cm  ?Needle Gauge: 21  ? ? ? ?Additional Needles: ? ? ?Narrative:  ?Start time: 11/04/2021 8:17 AM ?End time: 11/04/2021 8:23 AM ?Injection made incrementally with aspirations every 5 mL. ? ?Performed by: Personally  ?Anesthesiologist: Karleen Hampshire, MD ? ?Additional Notes: ?Ultrasound guided ? ?Patient consented for risk and benefits of nerve block including but not limited to nerve damage, failed block, bleeding and infection.  Patient voiced understanding. ? ?Functioning IV was confirmed and monitors were applied.  Timeout done prior to procedure and prior to any sedation being given to the patient.  Patient confirmed procedure site prior to any sedation given to the patient.  A 61mm 22ga Stimuplex needle was used. Sterile prep,hand hygiene and sterile gloves were used.  Minimal sedation used for procedure.  No paresthesia endorsed by patient during the procedure.  Negative aspiration and negative test dose prior to incremental administration of local anesthetic. The patient tolerated the procedure well with no immediate complications. ? ?7 mL ropivacaine 0.5% ? ? ? ?

## 2021-11-04 NOTE — Anesthesia Procedure Notes (Signed)
Procedure Name: Intubation ?Date/Time: 11/04/2021 8:35 AM ?Performed by: Nelda Marseille, CRNA ?Pre-anesthesia Checklist: Patient identified, Patient being monitored, Timeout performed, Emergency Drugs available and Suction available ?Patient Re-evaluated:Patient Re-evaluated prior to induction ?Oxygen Delivery Method: Circle system utilized ?Preoxygenation: Pre-oxygenation with 100% oxygen ?Induction Type: IV induction ?Ventilation: Mask ventilation without difficulty ?Laryngoscope Size: Mac, 3 and McGraph ?Grade View: Grade I ?Tube type: Oral ?Tube size: 7.5 mm ?Number of attempts: 1 ?Airway Equipment and Method: Stylet ?Placement Confirmation: ETT inserted through vocal cords under direct vision, positive ETCO2 and breath sounds checked- equal and bilateral ?Secured at: 21 cm ?Tube secured with: Tape ?Dental Injury: Teeth and Oropharynx as per pre-operative assessment  ? ? ? ? ?

## 2021-11-04 NOTE — Transfer of Care (Signed)
Immediate Anesthesia Transfer of Care Note ? ?Patient: Jesus Flowers ? ?Procedure(s) Performed: AMPUTATION BELOW KNEE (Right: Knee) ? ?Patient Location: PACU ? ?Anesthesia Type:General ? ?Level of Consciousness: awake, alert  and oriented ? ?Airway & Oxygen Therapy: Patient Spontanous Breathing and Patient connected to face mask oxygen ? ?Post-op Assessment: Report given to RN and Post -op Vital signs reviewed and stable ? ?Post vital signs: Reviewed and stable ? ?Last Vitals:  ?Vitals Value Taken Time  ?BP 140/89 11/04/21 1015  ?Temp    ?Pulse 81 11/04/21 1020  ?Resp 13 11/04/21 1020  ?SpO2 92 % 11/04/21 1020  ?Vitals shown include unvalidated device data. ? ?Last Pain:  ?Vitals:  ? 11/04/21 0630  ?TempSrc:   ?PainSc: 7   ?   ? ?Patients Stated Pain Goal: 3 (11/04/21 0630) ? ?Complications: No notable events documented. ?

## 2021-11-05 ENCOUNTER — Encounter: Payer: Self-pay | Admitting: Specialist

## 2021-11-05 LAB — GLUCOSE, CAPILLARY
Glucose-Capillary: 92 mg/dL (ref 70–99)
Glucose-Capillary: 250 mg/dL — ABNORMAL HIGH (ref 70–99)
Glucose-Capillary: 190 mg/dL — ABNORMAL HIGH (ref 70–99)
Glucose-Capillary: 191 mg/dL — ABNORMAL HIGH (ref 70–99)
Glucose-Capillary: 160 mg/dL — ABNORMAL HIGH (ref 70–99)
Glucose-Capillary: 170 mg/dL — ABNORMAL HIGH (ref 70–99)
Glucose-Capillary: 117 mg/dL — ABNORMAL HIGH (ref 70–99)

## 2021-11-05 LAB — BASIC METABOLIC PANEL
Anion gap: 6 (ref 5–15)
BUN: 33 mg/dL — ABNORMAL HIGH (ref 6–20)
CO2: 26 mmol/L (ref 22–32)
Calcium: 8.4 mg/dL — ABNORMAL LOW (ref 8.9–10.3)
Chloride: 102 mmol/L (ref 98–111)
Creatinine, Ser: 1.25 mg/dL — ABNORMAL HIGH (ref 0.61–1.24)
GFR, Estimated: >60 mL/min (ref >60)
Glucose, Bld: 316 mg/dL — ABNORMAL HIGH (ref 70–99)
Potassium: 4.4 mmol/L (ref 3.5–5.1)
Sodium: 134 mmol/L — ABNORMAL LOW (ref 135–145)

## 2021-11-05 LAB — CBC
HCT: 28.3 % — ABNORMAL LOW (ref 39.0–52.0)
Hemoglobin: 9.4 g/dL — ABNORMAL LOW (ref 13.0–17.0)
MCH: 31 pg (ref 26.0–34.0)
MCHC: 33.2 g/dL (ref 30.0–36.0)
MCV: 93.4 fL (ref 80.0–100.0)
Platelets: 376 10*3/uL (ref 150–400)
RBC: 3.03 MIL/uL — ABNORMAL LOW (ref 4.22–5.81)
RDW: 12.1 % (ref 11.5–15.5)
WBC: 15.6 10*3/uL — ABNORMAL HIGH (ref 4.0–10.5)
nRBC: 0 % (ref 0.0–0.2)

## 2021-11-05 MED ORDER — GLYCOPYRROLATE 1 MG PO TABS
1.0000 mg | ORAL_TABLET | Freq: Two times a day (BID) | ORAL | Status: DC
  Administered 2021-11-05 – 2021-11-07 (×5): 1 mg via ORAL
  Filled 2021-11-05 (×5): qty 1

## 2021-11-05 NOTE — Evaluation (Signed)
Physical Therapy Evaluation ?Patient Details ?Name: Jesus Flowers ?MRN: [id number] ?DOB: 06/10/1966 ?Today's Date: 11/05/2021 ? ?History of Present Illness ? 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 PMH of HTN, smoking, DMII, back surgery.  ?Clinical Impression ? Patient was alert, agreeable to PT reported 6/10 R leg pain, premedicated prior to session. He stated at baseline he is independent, lives with his adult sons who work so he'll be alone intermittently.  ? ?The patient performed bed mobility modI. Stand pivot as well as hop to pivot with RW performed, CGA and cueing for hand placement/technique, no true physical assistance needed. He was also able to hop with RW and CGA ~6340ft twice. He also attempted stairs multiple times, but was unable to successfully ascend stairs with unilateral support. Able to ascend with bilateral rails and descend with unilateral, but mod-maxA for safety due to poor foot clearance on ascension. He would benefit from further stair training. WC and scooting navigation verbally explained and handouts administered.  Overall the patient demonstrated deficits (see "PT Problem List") that impede the patient's functional abilities, safety, and mobility and would benefit from skilled PT intervention. Recommendation is HHPT with intermittent supervision. ?  ?Patient suffers from acute R BKA which impairs his ability to perform daily activities like toileting, feeding, dressing, grooming, bathing in the home. A cane, walker, crutch will not resolve the patient's issue with performing activities of daily living. A lightweight wheelchair, elevating leg rests, and cushion is required/recommended and will allow patient to safely perform daily activities. ?  ?Patient can safely propel the wheelchair in the home or has a caregiver who can provide assistance.  ?   ?   ? ?Recommendations for follow up therapy are one component of a multi-disciplinary discharge planning process, led by the attending physician.   Recommendations may be updated based on patient status, additional functional criteria and insurance authorization. ? ?Follow Up Recommendations Home health PT ? ?  ?Assistance Recommended at Discharge Intermittent Supervision/Assistance  ?Patient can return home with the following ? A little help with bathing/dressing/bathroom;Assistance with cooking/housework;Assist for transportation;Help with stairs or ramp for entrance;Direct supervision/assist for medications management ? ?  ?Equipment Recommendations Wheelchair (measurements PT);Wheelchair cushion (measurements PT);BSC/3in1  ?Recommendations for Other Services ?    ?  ?Functional Status Assessment Patient has had a recent decline in their functional status and demonstrates the ability to make significant improvements in function in a reasonable and predictable amount of time.  ? ?  ?Precautions / Restrictions Precautions ?Precautions: Fall ?Restrictions ?Weight Bearing Restrictions: No ?Other Position/Activity Restrictions: R BKA  ? ?  ? ?Mobility ? Bed Mobility ?Overal bed mobility: Modified Independent ?  ?  ?  ?  ?  ?  ?  ?  ? ?Transfers ?Overall transfer level: Needs assistance ?Equipment used: Rolling walker (2 wheels), None ?Transfers: Sit to/from Stand, Bed to chair/wheelchair/BSC ?Sit to Stand: Min guard ?  ?  ?Squat pivot transfers: Min guard ?  ?  ?General transfer comment: cued for hand placement for pivot and for use of RW ?  ? ?Ambulation/Gait ?  ?Gait Distance (Feet):  (2340ft, twice) ?Assistive device: Rolling walker (2 wheels) ?  ?  ?  ?  ?General Gait Details: hop to gait pattern with no bouts of unsteadiness. shoe donned ? ?Stairs ?Stairs: Yes ?Stairs assistance: Mod assist, Max assist, Min guard ?Stair Management: One rail Right, One rail Left, Two rails ?Number of Stairs: 6 ?General stair comments: tried with unilateral rail, mod-max due to  poor foot clearance ascending, descending with CGA. with bilateral rails, able to hop up with CGA.  many bouts attempted ? ?Wheelchair Mobility ?  ? ?Modified Rankin (Stroke Patients Only) ?  ? ?  ? ?Balance Overall balance assessment: Needs assistance ?Sitting-balance support: Feet supported ?Sitting balance-Leahy Scale: Good ?  ?  ?Standing balance support: Reliant on assistive device for balance ?Standing balance-Leahy Scale: Fair ?  ?  ?  ?  ?  ?  ?  ?  ?  ?  ?  ?  ?   ? ? ? ?Pertinent Vitals/Pain Pain Assessment ?Pain Assessment: 0-10 ?Pain Score: 6  ?Pain Descriptors / Indicators: Sore ?Pain Intervention(s): Limited activity within patient's tolerance, Monitored during session, Premedicated before session, Repositioned  ? ? ?Home Living Family/patient expects to be discharged to:: Private residence ?Living Arrangements: Children ?Available Help at Discharge: Available PRN/intermittently ?Type of Home: House ?Home Access: Stairs to enter ?Entrance Stairs-Rails: Right;Left ?Entrance Stairs-Number of Steps: 3 ?  ?Home Layout: One level ?Home Equipment: Shower seat;Cane - quad ?   ?  ?Prior Function Prior Level of Function : Independent/Modified Independent ?  ?  ?  ?  ?  ?  ?  ?  ?  ? ? ?Hand Dominance  ?   ? ?  ?Extremity/Trunk Assessment  ? Upper Extremity Assessment ?Upper Extremity Assessment: Overall WFL for tasks assessed ?  ? ?Lower Extremity Assessment ?Lower Extremity Assessment:  (s/p R BKA) ?  ? ?Cervical / Trunk Assessment ?Cervical / Trunk Assessment: Normal  ?Communication  ? Communication: No difficulties  ?Cognition Arousal/Alertness: Awake/alert ?Behavior During Therapy: [facility] for tasks assessed/performed ?Overall Cognitive Status: Within Functional Limits for tasks assessed ?  ?  ?  ?  ?  ?  ?  ?  ?  ?  ?  ?  ?  ?  ?  ?  ?  ?  ?  ? ?  ?General Comments   ? ?  ?Exercises Other Exercises ?Other Exercises: Pt educated on importance of TKE at rest, desensitization techniques, HEP administered. Pt and PT verbally reviewd and PT demonstrated scooting upstairs if needed as well, and handout to describe  stair navigation with WC also given  ? ?Assessment/Plan  ?  ?PT Assessment Patient needs continued PT services  ?PT Problem List Decreased strength;Decreased mobility;Decreased range of motion;Decreased knowledge of precautions;Decreased activity tolerance;Pain;Decreased knowledge of use of DME;Decreased balance ? ?   ?  ?PT Treatment Interventions DME instruction;Therapeutic exercise;Gait training;Balance training;Wheelchair mobility training;Stair training;Neuromuscular re-education;Functional mobility training;Therapeutic activities;Patient/family education   ? ?PT Goals (Current goals can be found in the Care Plan section)  ?Acute Rehab PT Goals ?Patient Stated Goal: to go home ?PT Goal Formulation: With patient ?Time For Goal Achievement: 11/19/21 ?Potential to Achieve Goals: Good ? ?  ?Frequency 7X/week ?  ? ? ?Co-evaluation   ?  ?  ?  ?  ? ? ?  ?AM-PAC PT "6 Clicks" Mobility  ?Outcome Measure Help needed turning from your back to your side while in a flat bed without using bedrails?: None ?Help needed moving from lying on your back to sitting on the side of a flat bed without using bedrails?: None ?Help needed moving to and from a bed to a chair (including a wheelchair)?: None ?Help needed standing up from a chair using your arms (e.g., wheelchair or bedside chair)?: None ?Help needed to walk in hospital room?: None ?Help needed climbing 3-5 steps with a railing? : A Lot ?6 Click Score:  22 ? ?  ?End of Session Equipment Utilized During Treatment: Gait belt ?Activity Tolerance: Patient tolerated treatment well ?Patient left: in chair;with call bell/phone within reach;with nursing/sitter in room ?Nurse Communication: Mobility status ?PT Visit Diagnosis: Other abnormalities of gait and mobility (R26.89);Pain;Muscle weakness (generalized) (M62.81) ?Pain - Right/Left: Right ?Pain - part of body: Leg ?  ? ?Time: 7253-6644 ?PT Time Calculation (min) (ACUTE ONLY): 29 min ? ? ?Charges:   PT Evaluation ?$PT Eval Low  Complexity: 1 Low ?PT Treatments ?$Gait Training: 23-37 mins ?  ?   ? ?Olga Coaster PT, DPT ?11:35 AM,11/05/21 ? ? ?

## 2021-11-05 NOTE — Evaluation (Signed)
Occupational Therapy Evaluation ?Patient Details ?Name: Jesus Flowers ?MRN: [id number] ?DOB: [birth year] ?Today's Date: 11/05/2021 ? ? ?History of Present Illness Pt is a 7 male s/p R BKA. PMH of HTN, smoking, DMII, back surgery.  ? ?Clinical Impression ?  ?Patient presenting with decreased Ind in self care, balance, functional mobility/transfers, endurance, and safety awareness.  Patient reports being mod I at baseline with use of quad cane and off loading shoe. Pt lives with 2 sons who work outside of the home. Pt is very motivated for therapeutic intervention. OT educating pt on pain management and phantom leg pain. Pt returns demonstrations for lightly rubbing and tapping residual limb. Patient stands from recliner chair with supervision and use of RW. Hops to sink with supervision - min guard for balance. Min guard for pt to stand at sink for grooming tasks. He is making excellent progress. OT recommending use of wheelchair at home for safety when family members are at work during the day. Patient will benefit from acute OT to increase overall independence in the areas of ADLs, functional mobility, and safety awareness in order to safely discharge home.  ?   ? ?Recommendations for follow up therapy are one component of a multi-disciplinary discharge planning process, led by the attending physician.  Recommendations may be updated based on patient status, additional functional criteria and insurance authorization.  ? ?Follow Up Recommendations ? Home health OT  ?  ?Assistance Recommended at Discharge Intermittent Supervision/Assistance  ?Patient can return home with the following A little help with walking and/or transfers;A little help with bathing/dressing/bathroom;Help with stairs or ramp for entrance;Assist for transportation ? ?  ?Functional Status Assessment ? Patient has had a recent decline in their functional status and demonstrates the ability to make significant improvements in function in a reasonable  and predictable amount of time.  ?Equipment Recommendations ? Wheelchair cushion (measurements OT);Wheelchair (measurements OT)  ?  ?   ?Precautions / Restrictions Precautions ?Precautions: Fall ?Restrictions ?Weight Bearing Restrictions: No ?Other Position/Activity Restrictions: R BKA  ? ?  ? ?Mobility Bed Mobility ?  ?  ?  ?  ?  ?  ?  ?General bed mobility comments: seated in recliner chair ?  ? ?Transfers ?Overall transfer level: Needs assistance ?Equipment used: Rolling walker (2 wheels) ?Transfers: Sit to/from Stand, Bed to chair/wheelchair/BSC ?Sit to Stand: Supervision ?Stand pivot transfers: Min guard ?  ?  ?  ?  ?  ?  ? ?  ?Balance Overall balance assessment: Needs assistance ?Sitting-balance support: Feet supported ?Sitting balance-Leahy Scale: Good ?  ?  ?Standing balance support: Reliant on assistive device for balance ?  ?  ?  ?  ?  ?  ?  ?  ?  ?  ?  ?  ?  ?   ? ?ADL either performed or assessed with clinical judgement  ? ?ADL   ?  ?  ?  ?  ?  ?  ?  ?  ?  ?  ?  ?  ?  ?  ?  ?  ?  ?  ?  ?General ADL Comments: supervision - CGA with RW  ? ? ? ?Vision Patient Visual Report: No change from baseline ?   ?   ?   ?   ? ?Pertinent Vitals/Pain Pain Assessment ?Pain Assessment: 0-10 ?Pain Score: 8  ?Pain Location: R residual limb ?Pain Descriptors / Indicators: Sore ?Pain Intervention(s): Limited activity within patient's tolerance, Monitored during session, Premedicated before session, Repositioned  ? ? ? ?   ?  Extremity/Trunk Assessment Upper Extremity Assessment ?Upper Extremity Assessment: Overall WFL for tasks assessed ?  ?Lower Extremity Assessment ?Lower Extremity Assessment: Defer to PT evaluation ?  ?Cervical / Trunk Assessment ?Cervical / Trunk Assessment: Normal ?  ?Communication Communication ?Communication: No difficulties ?  ?Cognition Arousal/Alertness: Awake/alert ?Behavior During Therapy: [facility] for tasks assessed/performed ?Overall Cognitive Status: Within Functional Limits for tasks assessed ?  ?  ?   ?  ?  ?  ?  ?  ?  ?  ?  ?  ?  ?  ?  ?  ?General Comments: Pt is pleasant, cooperative, and motivated to return home. ?  ?  ?   ?   ?   ? ? ?Home Living Family/patient expects to be discharged to:: Private residence ?Living Arrangements: Children ?Available Help at Discharge: Available PRN/intermittently ?Type of Home: House ?Home Access: Stairs to enter ?Entrance Stairs-Number of Steps: 3 ?Entrance Stairs-Rails: Right;Left ?Home Layout: One level ?  ?  ?Bathroom Shower/Tub: Tub/shower unit ?  ?Bathroom Toilet: Standard ?  ?  ?Home Equipment: Shower seat;Cane - quad ?  ?  ?  ? ?  ?Prior Functioning/Environment Prior Level of Function : Independent/Modified Independent ?  ?  ?  ?  ?  ?  ?  ?  ?  ? ?  ?  ?OT Problem List: Decreased strength;Decreased range of motion;Decreased activity tolerance;Impaired balance (sitting and/or standing);Decreased safety awareness ?  ?   ?OT Treatment/Interventions: Self-care/ADL training;Therapeutic exercise;Patient/family education;Balance training;Energy conservation;Therapeutic activities;DME and/or AE instruction  ?  ?OT Goals(Current goals can be found in the care plan section) Acute Rehab OT Goals ?Patient Stated Goal: to go home ?OT Goal Formulation: With patient ?Time For Goal Achievement: 11/19/21 ?Potential to Achieve Goals: Good ?ADL Goals ?Pt Will Perform Grooming: with modified independence;standing ?Pt Will Perform Lower Body Dressing: with modified independence;sit to/from stand ?Pt Will Transfer to Toilet: with modified independence;ambulating ?Pt Will Perform Toileting - Clothing Manipulation and hygiene: with modified independence;sit to/from stand  ?OT Frequency: Min 2X/week ?  ? ?   ?AM-PAC OT "6 Clicks" Daily Activity     ?Outcome Measure Help from another person eating meals?: None ?Help from another person taking care of personal grooming?: A Little ?Help from another person toileting, which includes using toliet, bedpan, or urinal?: A Little ?Help from another  person bathing (including washing, rinsing, drying)?: A Little ?Help from another person to put on and taking off regular upper body clothing?: None ?Help from another person to put on and taking off regular lower body clothing?: A Little ?6 Click Score: 20 ?  ?End of Session Equipment Utilized During Treatment: Rolling walker (2 wheels) ?Nurse Communication: Mobility status ? ?Activity Tolerance: Patient tolerated treatment well ?Patient left: in chair;with call bell/phone within reach ? ?OT Visit Diagnosis: Unsteadiness on feet (R26.81);Muscle weakness (generalized) (M62.81)  ?              ?Time: 2119-4174 ?OT Time Calculation (min): 18 min ?Charges:  OT General Charges ?$OT Visit: 1 Visit ?OT Evaluation ?$OT Eval Moderate Complexity: 1 Mod ?OT Treatments ?$Self Care/Home Management : 8-22 mins ? ?Jackquline Denmark, MS, OTR/L , CBIS ?ascom [phone]  ?11/05/21, 12:53 PM  ?

## 2021-11-05 NOTE — Progress Notes (Signed)
PROGRESS NOTE    Jesus Flowers  ZOX:[id number] DOB: 08-09-1965 DOA: 10/30/2021 PCP: [facility]    Brief Narrative:  56 y.o. 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 Patient then returns 07/2021 with foot infection and diagnosed with residual osteomyelitis at site of R foot TMA. 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 Patient now returns to ed after follow with podiatry 10/30/22 with diagnosis of "severe dehiscence and necrosis of wound and concern for osteomyelitis of the remaining talus and calcaneus, probable septic arthritis of  subtalar and as well as ankle." Per podiatry noted. Per podiatry will need BKA .Patient was referred to ED yesterday but presented today. Per patient he noted worsening pain s/p fall and injury 3-4 days ago.  He notes increase swelling drainage and odor. He notes associated chills but no fever, n/v/d. ON further ros no sob/ chest pain / cough / uri signs and symptoms. He notes he is complaint with is diabetic medication.  Podiatry consulted.  Nothing to offer from their standpoint.  Vascular surgery engaged.  Discussed with patient about high likelihood of BKA.  Patient aware and in agreement.  Infectious disease consulted for recommendations.    Status post angiogram 4/7.  On 4/8 pain well controlled.  Patient overall stable.  Case discussed with vascular surgery Dr. Renne Crigler at bedside.    4/10: Patient underwent successful right BKA.  Tolerated procedure well.  Postoperative pain mild to moderate.  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 We will discuss possibility of topical glycopyrrolate at time of discharge Cost and medication availability may be a barrier Plan: Discussed with pharmacy.  Will attempt oral glycopyrrolate 1 mg p.o. twice daily.  If effective can prescribe this medication at time of discharge  Acute kidney injury Appears resolved Suspect prerenal azotemia Monitor kidney function while on antibiotics  Hyponatremia Likely related to elevated blood sugars Resolved with administration of IVF Monitor labs  History of alcohol use No evidence of withdrawal No indication for CIWA protocol  Hyperlipidemia PTA statin  Depression PTA fluoxetine  Hypertension PTA lisinopril    DVT prophylaxis: SQ Lovenox Code Status: Full code Family Communication: Patient's daughter via phone 4/7 Disposition Plan: Status is: Inpatient Remains inpatient appropriate because: Acute osteomyelitis associated with wound dehiscence right lower extremity.  Extremity nonviable.  Status post BKA 4/9   Level of care: Med-Surg  Consultants:  Podiatry  Procedures:  None  Antimicrobials: Unasyn   Subjective: Seen and examined.  Postop day 1 status post right BKA.  In good  spirits.   Hemodynamically stable.  Pain well controlled  Objective: Vitals:   11/05/21 0303 11/05/21 0429 11/05/21 0500 11/05/21 0736  BP: 114/76 (!) 149/90  (!) 149/93  Pulse: 63 62  64  Resp:  17  16  Temp:    97.7 F (36.5 C)  TempSrc:    Oral  SpO2:  97%  98%  Weight:   80.5 kg   Height:        Intake/Output Summary (Last 24 hours) at 11/05/2021 1050 Last data filed at 11/05/2021 0900 Gross per 24 hour  Intake 1687.81 ml  Output --  Net 1687.81 ml   Filed Weights   11/02/21 0500 11/03/21 0500 11/05/21 0500  Weight: 78.9 kg 76.7 kg 80.5 kg    Examination:  General exam: NAD Respiratory system: Lungs clear.  Normal work of breathing.  Room air Cardiovascular system: S1-S2, RRR, no murmurs, no pedal edema Gastrointestinal system: Soft, NT/ND, normal bowel sounds Central nervous system: Alert and oriented. No focal neurological deficits. Extremities: Status post right BKA.  Right stump in surgical dressing, not removed Skin: No rashes, lesions or ulcers Psychiatry: Judgement and insight appear normal. Mood & affect appropriate.     Data Reviewed: I have personally reviewed following labs and imaging studies  CBC: Recent Labs  Lab 10/30/21 1038 10/30/21 1839 10/31/21 0445 11/04/21 0410 11/04/21 1159 11/05/21 0421  WBC 11.8* 10.7* 9.5 11.1* 13.4* 15.6*  NEUTROABS 9.9*  --   --  8.2*  --   --   HGB 10.8* 9.8* 8.8* 8.7* 10.0* 9.4*  HCT 32.2* 28.9* 26.6* 26.4* 30.1* 28.3*  MCV 91.5 91.2 91.4 92.6 93.2 93.4  PLT 352 338 296 360 401* 376   Basic Metabolic Panel: Recent Labs  Lab 10/30/21 1038 10/30/21 1839 10/31/21 0445 11/01/21 0624 11/02/21 0027 11/04/21 0410 11/04/21 1159 11/05/21 0421  NA 128* 133* 136  --   --  137  --  134*  K 5.2* 4.5 4.4  --   --  4.4  --  4.4  CL 94* 100 105  --   --  105  --  102  CO2 25 25 24   --   --  28  --  26  GLUCOSE 557* 271* 110*  --   --  112*  --  316*  BUN 42* 35* 30*  --  28* 27*  --  33*  CREATININE 1.57* 1.22 1.01 1.08  1.18 1.06 0.91 1.25*  CALCIUM 8.7* 8.5* 8.4*  --   --  8.4*  --  8.4*  MG  --  2.1  --   --   --   --   --   --   PHOS  --  3.0  --   --   --   --   --   --    GFR: Estimated Creatinine Clearance: 65.8 mL/min (A) (by C-G formula based on SCr of 1.25 mg/dL (H)). Liver Function Tests: Recent Labs  Lab 10/30/21 1038 10/31/21 0445  AST 27 15  ALT 34 23  ALKPHOS 289* 176*  BILITOT 0.5 0.5  PROT 7.8 6.5  ALBUMIN 2.8* 2.4*   No results for input(s): LIPASE, AMYLASE in the last 168 hours. No results for input(s): AMMONIA in the last 168 hours. Coagulation Profile: Recent Labs  Lab 11/04/21 0410  INR 1.1   Cardiac Enzymes: No results for input(s): CKTOTAL, CKMB, CKMBINDEX, TROPONINI in the last 168 hours. BNP (last 3 results) No results for input(s): PROBNP  in the last 8760 hours. HbA1C: No results for input(s): HGBA1C in the last 72 hours.  CBG: Recent Labs  Lab 11/04/21 0800 11/04/21 1043 11/04/21 1718 11/04/21 2054 11/05/21 0821  GLUCAP 92 118* 350* 294* 250*   Lipid Profile: No results for input(s): Jesus, HDL, LDLCALC, TRIG, CHOLHDL, LDLDIRECT in the last 72 hours. Thyroid Function Tests: No results for input(s): TSH, T4TOTAL, FREET4, T3FREE, THYROIDAB in the last 72 hours. Anemia Panel: No results for input(s): VITAMINB12, FOLATE, FERRITIN, TIBC, IRON, RETICCTPCT in the last 72 hours. Sepsis Labs: Recent Labs  Lab 10/30/21 1038 10/30/21 1209  LATICACIDVEN 1.0 1.0    Recent Results (from the past 240 hour(s))  WOUND CULTURE     Status: Abnormal   Collection Time: 10/29/21 11:25 AM  Result Value Ref Range Status   Gram Stain Result Final report  Final   Organism ID, Bacteria Comment  Final    Comment: No white blood cells seen.   Organism ID, Bacteria Comment  Final    Comment: Few gram positive cocci   Aerobic Bacterial Culture Final report (A)  Final   Organism ID, Bacteria Routine flora  Final    Comment: Light growth   Organism ID, Bacteria  Staphylococcus aureus (A)  Final    Comment: Based on susceptibility to oxacillin this isolate would be susceptible to: *Penicillinase-stable penicillins, such as:   Cloxacillin, Dicloxacillin, Nafcillin *Beta-lactam combination agents, such as:   Amoxicillin-clavulanic acid, Ampicillin-sulbactam,   Piperacillin-tazobactam *Oral cephems, such as:   Cefaclor, Cefdinir, Cefpodoxime, Cefprozil, Cefuroxime,   Cephalexin, Loracarbef *Parenteral cephems, such as:   Cefazolin, Cefepime, Cefotaxime, Cefotetan, Ceftaroline,   Ceftizoxime, Ceftriaxone, Cefuroxime *Carbapenems, such as:   Doripenem, Ertapenem, Imipenem, Meropenem Light growth    Antimicrobial Susceptibility Comment  Final    Comment:       ** S = Susceptible; I = Intermediate; R = Resistant **                    P = Positive; N = Negative             MICS are expressed in micrograms per mL    Antibiotic                 RSLT#1    RSLT#2    RSLT#3    RSLT#4 Ciprofloxacin                            S Clindamycin                              S Erythromycin                             S Gentamicin                               R Levofloxacin                             S Linezolid                                S Moxifloxacin  S Oxacillin                                S Penicillin                               R Quinupristin/Dalfopristin                S Rifampin                                 S Tetracycline                             S Trimethoprim/Sulfa                       S Vancomycin                               S   Culture, blood (routine x 2)     Status: None   Collection Time: 10/30/21 12:09 PM   Specimen: BLOOD  Result Value Ref Range Status   Specimen Description BLOOD BLOOD RIGHT FOREARM  Final   Special Requests   Final    BOTTLES DRAWN AEROBIC AND ANAEROBIC Blood Culture adequate volume   Culture   Final    NO GROWTH 5 DAYS Performed at [facility], [address], [place], [place] [address]    Report Status 11/04/2021 FINAL  Final  Culture, blood (routine x 2)     Status: None   Collection Time: 10/30/21 12:09 PM   Specimen: BLOOD  Result Value Ref Range Status   Specimen Description BLOOD BLOOD RIGHT HAND  Final   Special Requests   Final    BOTTLES DRAWN AEROBIC AND ANAEROBIC Blood Culture adequate volume   Culture   Final    NO GROWTH 5 DAYS Performed at [facility], [address] [place], [place] [address]    Report Status 11/04/2021 FINAL  Final         Radiology Studies: [place] OR NERVE BLOCK-IMAGE ONLY [facility])  Result Date: 11/04/2021 There is no interpretation for this exam.  This order is for images obtained during a surgical procedure.  Please See "Surgeries" Tab for more information regarding the procedure.        Scheduled Meds:  vitamin C  500 mg Oral BID   docusate sodium  100 mg Oral Daily   FLUoxetine  40 mg Oral Daily   gabapentin  300 mg Oral BID   glycopyrrolate  1 mg Oral BID   heparin  5,000 Units Subcutaneous Q8H   insulin aspart  0-20 Units Subcutaneous TID WC   insulin aspart  3 Units Subcutaneous TID WC   insulin detemir  20 Units Subcutaneous BID   lisinopril  20 mg Oral Daily   multivitamin with minerals  1 tablet Oral Daily   nutrition supplement (JUVEN)  1 packet Oral BID BM   pravastatin  20 mg Oral q1800   Ensure Max Protein  11 oz Oral QHS   sodium chloride flush  3 mL Intravenous Q12H   traZODone  50 mg Oral QHS   zinc sulfate  220 mg Oral Daily  Continuous Infusions:  sodium chloride Stopped (11/04/21 2111)   ampicillin-sulbactam (UNASYN) IV 3 g (11/05/21 0527)   famotidine (PEPCID) IV 20 mg (11/05/21 1043)   magnesium sulfate bolus IVPB       LOS: 6 days     Tresa Moore, MD [facility]   If 7PM-7AM, please contact night-coverage  11/05/2021, 10:50 AM

## 2021-11-05 NOTE — Plan of Care (Signed)

## 2021-11-05 NOTE — Progress Notes (Signed)
ID ?Pt has pain rt stump ?No fever ?Had BKA yesterday ? ?O/e awake and alert ?BP (!) 157/100 (BP Location: Left Arm)   Pulse 78   Temp 98.7 ?F (37.1 ?C)   Resp 18   Ht 5\' 6"  (1.676 m)   Wt 80.5 kg   SpO2 96%   BMI 28.64 kg/m?   ?Pale ?Chest b/l air entry ?Hss1s2 ?Abd soft ?Rt BKA dressing not removed ? ?Labs ? ?  Latest Ref Rng & Units 11/05/2021  ?  4:21 AM 11/04/2021  ? 11:59 AM 11/04/2021  ?  4:10 AM  ?CBC  ?WBC 4.0 - 10.5 K/uL 15.6   13.4   11.1    ?Hemoglobin 13.0 - 17.0 g/dL 9.4   01/04/2022   8.7    ?Hematocrit 39.0 - 52.0 % 28.3   30.1   26.4    ?Platelets 150 - 400 K/uL 376   401   360    ?  ? ?  Latest Ref Rng & Units 11/05/2021  ?  4:21 AM 11/04/2021  ? 11:59 AM 11/04/2021  ?  4:10 AM  ?CMP  ?Glucose 70 - 99 mg/dL 01/04/2022    010    ?BUN 6 - 20 mg/dL 33    27    ?Creatinine 0.61 - 1.24 mg/dL 932   3.55   7.32    ?Sodium 135 - 145 mmol/L 134    137    ?Potassium 3.5 - 5.1 mmol/L 4.4    4.4    ?Chloride 98 - 111 mmol/L 102    105    ?CO2 22 - 32 mmol/L 26    28    ?Calcium 8.9 - 10.3 mg/dL 8.4    8.4    ?  ? ?Impression/recommendation ? ?Infection of the right foot amputation stump.  Status post Chopart's amputation and January 2023.  Dehiscence and abscess at the surgical site.  Osteomyelitis of the calcaneum. ?Recent wound culture on 10/30/2018 through only skin flora ?In the past he has had group B strep infections ?He had angio done on 11/02/2021 which showed adequate blood flow for wound healing. ?Underwent BKA on 11/04/2021 ?Currently on Unasyn.  Can be discontinued after tomorrow ? ?Diabetes mellitus was poorly controlled on admission.  On insulin ? ?Peripheral neuropathy ? ?Anemia ? ?AKI ? ?ID will sign off.  Call if needed ?

## 2021-11-06 DIAGNOSIS — L97509 Non-pressure chronic ulcer of other part of unspecified foot with unspecified severity: Secondary | ICD-10-CM

## 2021-11-06 DIAGNOSIS — E1152 Type 2 diabetes mellitus with diabetic peripheral angiopathy with gangrene: Secondary | ICD-10-CM

## 2021-11-06 DIAGNOSIS — E11621 Type 2 diabetes mellitus with foot ulcer: Secondary | ICD-10-CM

## 2021-11-06 LAB — CBC
HCT: 27.8 % — ABNORMAL LOW (ref 39.0–52.0)
Hemoglobin: 9.2 g/dL — ABNORMAL LOW (ref 13.0–17.0)
MCH: 30.9 pg (ref 26.0–34.0)
MCHC: 33.1 g/dL (ref 30.0–36.0)
MCV: 93.3 fL (ref 80.0–100.0)
Platelets: 409 10*3/uL — ABNORMAL HIGH (ref 150–400)
RBC: 2.98 MIL/uL — ABNORMAL LOW (ref 4.22–5.81)
RDW: 12.4 % (ref 11.5–15.5)
WBC: 14.2 10*3/uL — ABNORMAL HIGH (ref 4.0–10.5)
nRBC: 0 % (ref 0.0–0.2)

## 2021-11-06 LAB — SURGICAL PATHOLOGY

## 2021-11-06 LAB — BASIC METABOLIC PANEL
Anion gap: 7 (ref 5–15)
BUN: 32 mg/dL — ABNORMAL HIGH (ref 6–20)
CO2: 27 mmol/L (ref 22–32)
Calcium: 8.6 mg/dL — ABNORMAL LOW (ref 8.9–10.3)
Chloride: 102 mmol/L (ref 98–111)
Creatinine, Ser: 1.1 mg/dL (ref 0.61–1.24)
GFR, Estimated: >60 mL/min (ref >60)
Glucose, Bld: 198 mg/dL — ABNORMAL HIGH (ref 70–99)
Potassium: 4.6 mmol/L (ref 3.5–5.1)
Sodium: 136 mmol/L (ref 135–145)

## 2021-11-06 LAB — GLUCOSE, CAPILLARY
Glucose-Capillary: 167 mg/dL — ABNORMAL HIGH (ref 70–99)
Glucose-Capillary: 155 mg/dL — ABNORMAL HIGH (ref 70–99)
Glucose-Capillary: 83 mg/dL (ref 70–99)

## 2021-11-06 MED ORDER — GABAPENTIN 300 MG PO CAPS
300.0000 mg | ORAL_CAPSULE | Freq: Three times a day (TID) | ORAL | Status: DC
Start: 2021-11-06 — End: 2021-11-07
  Administered 2021-11-06 – 2021-11-07 (×3): 300 mg via ORAL
  Filled 2021-11-06 (×3): qty 1

## 2021-11-06 NOTE — Progress Notes (Signed)
Patient suffers from Amputation which impairs their ability to perform daily activities like ADLs in the home.  A walking aide will not resolve  ?issue with performing activities of daily living. A wheelchair will allow patient to safely perform daily activities. Patient is not able to propel themselves in the home using a standard weight wheelchair due to weakness. Patient can self propel in the lightweight wheelchair. Length of need 12 months. ?Accessories: elevating leg rests (ELRs), wheel locks, extensions and anti-tippers. Back Cushion ?

## 2021-11-06 NOTE — Progress Notes (Signed)
Physical Therapy Treatment ?Patient Details ?Name: Jesus Flowers ?MRN: [id number] ?DOB: 10-25-1965 ?Today's Date: 11/06/2021 ? ? ?History of Present Illness Pt is a 53 male s/p R BKA. PMH of HTN, smoking, DMII, back surgery. ? ?  ?PT Comments  ? ? Pt alert, agreeable to PT, reported 7/10 BKA pain, throbbing today. Session focused on teaching the patient how to manage WC parts and navigate WC. PT demonstrated and verbally explained education involving leg rests, breaks, drop arm, etc. He was able to perform these tasks with feedback/assistance 25% of the time. He was able to self propel ~126ft, including forwards, backwards, and turns. Pt remembered to lock the brakes 100% of the time. Mechanics of stand pivot reviewed and pt able to perform with supervision. The patient continues to make great progress with PT, up in chair with all needs in reach. Pt agreeable to PT plan tomorrow to practice stair navigation again, with family involvement.  ? ?   ?Recommendations for follow up therapy are one component of a multi-disciplinary discharge planning process, led by the attending physician.  Recommendations may be updated based on patient status, additional functional criteria and insurance authorization. ? ?Follow Up Recommendations ? Home health PT ?  ?  ?Assistance Recommended at Discharge Intermittent Supervision/Assistance  ?Patient can return home with the following A little help with bathing/dressing/bathroom;Assistance with cooking/housework;Assist for transportation;Help with stairs or ramp for entrance;Direct supervision/assist for medications management ?  ?Equipment Recommendations ? Wheelchair (measurements PT);Wheelchair cushion (measurements PT);BSC/3in1  ?  ?Recommendations for Other Services   ? ? ?  ?Precautions / Restrictions Precautions ?Precautions: Fall ?Restrictions ?Weight Bearing Restrictions: No ?Other Position/Activity Restrictions: R BKA  ?  ? ?Mobility ? Bed Mobility ?Overal bed mobility:  Modified Independent ?  ?  ?  ?  ?  ?  ?  ?  ? ?Transfers ?Overall transfer level: Needs assistance ?Equipment used: None ?Transfers: Bed to chair/wheelchair/BSC ?  ?Stand pivot transfers: Supervision ?  ?  ?  ?  ?General transfer comment: cued for hand placement, pt tends to do a 270 turn to stand pivot, but with cueing can perform without turning ?  ? ?Ambulation/Gait ?  ?Gait Distance (Feet):  (Pt propelled WC ~167ft) ?  ?  ?  ?  ?  ?  ? ? ?Stairs ?  ?  ?  ?  ?  ? ? ?Wheelchair Mobility ?  ? ?Modified Rankin (Stroke Patients Only) ?  ? ? ?  ?Balance Overall balance assessment: Needs assistance ?Sitting-balance support: Feet supported ?Sitting balance-Leahy Scale: Good ?  ?  ?  ?Standing balance-Leahy Scale: Fair ?  ?  ?  ?  ?  ?  ?  ?  ?  ?  ?  ?  ?  ? ?  ?Cognition Arousal/Alertness: Awake/alert ?Behavior During Therapy: [facility] for tasks assessed/performed ?Overall Cognitive Status: Within Functional Limits for tasks assessed ?  ?  ?  ?  ?  ?  ?  ?  ?  ?  ?  ?  ?  ?  ?  ?  ?  ?  ?  ? ?  ?Exercises   ? ?  ?General Comments   ?  ?  ? ?Pertinent Vitals/Pain Pain Assessment ?Pain Assessment: 0-10 ?Pain Score: 7  ?Pain Descriptors / Indicators: Throbbing ?Pain Intervention(s): Monitored during session, Limited activity within patient's tolerance, Repositioned  ? ? ?Home Living   ?  ?  ?  ?  ?  ?  ?  ?  ?  ?   ?  ?  Prior Function    ?  ?  ?   ? ?PT Goals (current goals can now be found in the care plan section) Progress towards PT goals: Progressing toward goals ? ?  ?Frequency ? ? ? 7X/week ? ? ? ?  ?PT Plan Current plan remains appropriate  ? ? ?Co-evaluation   ?  ?  ?  ?  ? ?  ?AM-PAC PT "6 Clicks" Mobility   ?Outcome Measure ? Help needed turning from your back to your side while in a flat bed without using bedrails?: None ?Help needed moving from lying on your back to sitting on the side of a flat bed without using bedrails?: None ?Help needed moving to and from a bed to a chair (including a wheelchair)?: None ?Help  needed standing up from a chair using your arms (e.g., wheelchair or bedside chair)?: None ?Help needed to walk in hospital room?: None ?Help needed climbing 3-5 steps with a railing? : A Lot ?6 Click Score: 22 ? ?  ?End of Session   ?Activity Tolerance: Patient tolerated treatment well ?Patient left: in chair;with call bell/phone within reach ?Nurse Communication: Mobility status ?PT Visit Diagnosis: Other abnormalities of gait and mobility (R26.89);Pain;Muscle weakness (generalized) (M62.81) ?Pain - Right/Left: Right ?Pain - part of body: Leg ?  ? ? ?Time: 3557-3220 ?PT Time Calculation (min) (ACUTE ONLY): 23 min ? ?Charges:  $Therapeutic Activity: 8-22 mins ?$Wheel Chair Management: 8-22 mins          ?          ? ?Olga Coaster PT, DPT ?3:16 PM,11/06/21 ? ? ?

## 2021-11-06 NOTE — Progress Notes (Signed)
PT Cancellation Note ? ?Patient Details ?Name: Jesus Flowers ?MRN: [id number] ?DOB: 12/03/1965 ? ? ?Cancelled Treatment:    Reason Eval/Treat Not Completed: Other (comment). Pt declined PT this AM reporting too high levels of pain. PT to re-attempt as able.  ? ? ?Olga Coaster PT, DPT ?10:28 AM,11/06/21 ? ?

## 2021-11-06 NOTE — Plan of Care (Signed)

## 2021-11-06 NOTE — Progress Notes (Signed)
PROGRESS NOTE    Jesus Flowers  EXB:[id number] DOB: 06/10/1966 DOA: 10/30/2021 PCP: [facility]    Brief Narrative:  56 y.o. 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 Patient then returns 07/2021 with foot infection and diagnosed with residual osteomyelitis at site of R foot TMA. 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 Patient now returns to ed after follow with podiatry 10/30/22 with diagnosis of "severe dehiscence and necrosis of wound and concern for osteomyelitis of the remaining talus and calcaneus, probable septic arthritis of  subtalar and as well as ankle." Per podiatry noted. Per podiatry will need BKA .Patient was referred to ED yesterday but presented today. Per patient he noted worsening pain s/p fall and injury 3-4 days ago.  He notes increase swelling drainage and odor. He notes associated chills but no fever, n/v/d. ON further ros no sob/ chest pain / cough / uri signs and symptoms. He notes he is complaint with is diabetic medication.  Podiatry consulted.  Nothing to offer from their standpoint.  Vascular surgery engaged.  Discussed with patient about high likelihood of BKA.  Patient aware and in agreement.  Infectious disease consulted for recommendations.    Status post angiogram 4/7.  On 4/8 pain well controlled.  Patient overall stable.  Case discussed with vascular surgery Dr. Renne Crigler at bedside.    4/10: Patient underwent successful right BKA.  Tolerated procedure well.  Postoperative pain mild to moderate.  4/11: Pain slightly worse today.  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 We will discuss possibility of topical glycopyrrolate at time of discharge Cost and medication availability may be a barrier, medication $600 a month and availability is limited Plan: Continue oral glycopyrrolate 1 mg twice daily Basal bolus regimen DM coordinator consult Carb modified diet  Acute kidney injury Appears resolved Suspect prerenal azotemia Monitor kidney function while on antibiotics  Hyponatremia Likely related to elevated blood sugars Resolved with administration of IVF Monitor labs  History of alcohol use No evidence of withdrawal No indication for CIWA protocol  Hyperlipidemia PTA statin  Depression PTA fluoxetine  Hypertension PTA lisinopril    DVT prophylaxis: SQ Lovenox Code Status: Full code Family Communication: Patient's daughter via phone 4/7 Disposition Plan: Status is: Inpatient Remains inpatient appropriate because: Acute osteomyelitis associated with wound dehiscence right lower extremity.  Extremity nonviable.  Status post BKA 4/9.  PT and OT.  Hopefully discharge in the next few days   Level of care: Med-Surg  Consultants:  Podiatry  Procedures:   None  Antimicrobials: Unasyn  Subjective: Seen and examined.  Postop day 2 status post right BKA.  In good spirits.  Hemodynamically stable.  Pain well controlled  Objective: Vitals:   11/06/21 0500 11/06/21 0521 11/06/21 0807 11/06/21 1152  BP:  (!) 148/81 (!) 169/99 (!) 155/87  Pulse:  80 81 83  Resp:  18 18 16   Temp:  98.1 F (36.7 C) 98.8 F (37.1 C) 98.7 F (37.1 C)  TempSrc:      SpO2:  92% 97% 95%  Weight: 78.9 kg     Height:        Intake/Output Summary (Last 24 hours) at 11/06/2021 1200 Last data filed at 11/05/2021 1800 Gross per 24 hour  Intake 720 ml  Output --  Net 720 ml   Filed Weights   11/03/21 0500 11/05/21 0500 11/06/21 0500  Weight: 76.7 kg 80.5 kg 78.9 kg    Examination:  General exam: Mild distress due to pain Respiratory system: Lungs clear.  Normal work of breathing.  Room air Cardiovascular system: S1-S2, RRR, no murmurs, no pedal edema Gastrointestinal system: Soft, NT/ND, normal bowel sounds Central nervous system: Alert and oriented. No focal neurological deficits. Extremities: Status post right BKA.  Right stump in surgical dressing, not removed, dressings appear CDI Skin: No rashes, lesions or ulcers Psychiatry: Judgement and insight appear normal. Mood & affect appropriate.     Data Reviewed: I have personally reviewed following labs and imaging studies  CBC: Recent Labs  Lab 10/31/21 0445 11/04/21 0410 11/04/21 1159 11/05/21 0421 11/06/21 0351  WBC 9.5 11.1* 13.4* 15.6* 14.2*  NEUTROABS  --  8.2*  --   --   --   HGB 8.8* 8.7* 10.0* 9.4* 9.2*  HCT 26.6* 26.4* 30.1* 28.3* 27.8*  MCV 91.4 92.6 93.2 93.4 93.3  PLT 296 360 401* 376 409*   Basic Metabolic Panel: Recent Labs  Lab 10/30/21 1839 10/31/21 0445 11/01/21 0624 11/02/21 0027 11/04/21 0410 11/04/21 1159 11/05/21 0421 11/06/21 0351  NA 133* 136  --   --  137  --  134* 136  K 4.5 4.4  --   --  4.4  --  4.4 4.6  CL 100 105  --   --  105  --  102 102  CO2  25 24  --   --  28  --  26 27  GLUCOSE 271* 110*  --   --  112*  --  316* 198*  BUN 35* 30*  --  28* 27*  --  33* 32*  CREATININE 1.22 1.01   < > 1.18 1.06 0.91 1.25* 1.10  CALCIUM 8.5* 8.4*  --   --  8.4*  --  8.4* 8.6*  MG 2.1  --   --   --   --   --   --   --   PHOS 3.0  --   --   --   --   --   --   --    < > = values in this interval not displayed.   GFR: Estimated Creatinine Clearance: 74 mL/min (by C-G formula based on SCr of 1.1 mg/dL). Liver Function Tests: Recent Labs  Lab 10/31/21 0445  AST 15  ALT 23  ALKPHOS 176*  BILITOT 0.5  PROT 6.5  ALBUMIN 2.4*   No results for input(s): LIPASE, AMYLASE in the last 168 hours. No results for input(s): AMMONIA in the last 168 hours. Coagulation Profile: Recent Labs  Lab 11/04/21 0410  INR 1.1  Cardiac Enzymes: No results for input(s): CKTOTAL, CKMB, CKMBINDEX, TROPONINI in the last 168 hours. BNP (last 3 results) No results for input(s): PROBNP in the last 8760 hours. HbA1C: No results for input(s): HGBA1C in the last 72 hours.  CBG: Recent Labs  Lab 11/05/21 1652 11/05/21 1743 11/05/21 2111 11/06/21 0800 11/06/21 1154  GLUCAP 160* 170* 117* 167* 155*   Lipid Profile: No results for input(s): CHOL, HDL, LDLCALC, TRIG, CHOLHDL, LDLDIRECT in the last 72 hours. Thyroid Function Tests: No results for input(s): TSH, T4TOTAL, FREET4, T3FREE, THYROIDAB in the last 72 hours. Anemia Panel: No results for input(s): VITAMINB12, FOLATE, FERRITIN, TIBC, IRON, RETICCTPCT in the last 72 hours. Sepsis Labs: Recent Labs  Lab 10/30/21 1209  LATICACIDVEN 1.0    Recent Results (from the past 240 hour(s))  WOUND CULTURE     Status: Abnormal   Collection Time: 10/29/21 11:25 AM  Result Value Ref Range Status   Gram Stain Result Final report  Final   Organism ID, Bacteria Comment  Final    Comment: No white blood cells seen.   Organism ID, Bacteria Comment  Final    Comment: Few gram positive cocci   Aerobic Bacterial  Culture Final report (A)  Final   Organism ID, Bacteria Routine flora  Final    Comment: Light growth   Organism ID, Bacteria Staphylococcus aureus (A)  Final    Comment: Based on susceptibility to oxacillin this isolate would be susceptible to: *Penicillinase-stable penicillins, such as:   Cloxacillin, Dicloxacillin, Nafcillin *Beta-lactam combination agents, such as:   Amoxicillin-clavulanic acid, Ampicillin-sulbactam,   Piperacillin-tazobactam *Oral cephems, such as:   Cefaclor, Cefdinir, Cefpodoxime, Cefprozil, Cefuroxime,   Cephalexin, Loracarbef *Parenteral cephems, such as:   Cefazolin, Cefepime, Cefotaxime, Cefotetan, Ceftaroline,   Ceftizoxime, Ceftriaxone, Cefuroxime *Carbapenems, such as:   Doripenem, Ertapenem, Imipenem, Meropenem Light growth    Antimicrobial Susceptibility Comment  Final    Comment:       ** S = Susceptible; I = Intermediate; R = Resistant **                    P = Positive; N = Negative             MICS are expressed in micrograms per mL    Antibiotic                 RSLT#1    RSLT#2    RSLT#3    RSLT#4 Ciprofloxacin                            S Clindamycin                              S Erythromycin                             S Gentamicin                               R Levofloxacin                             S Linezolid  S Moxifloxacin                             S Oxacillin                                S Penicillin                               R Quinupristin/Dalfopristin                S Rifampin                                 S Tetracycline                             S Trimethoprim/Sulfa                       S Vancomycin                               S   Culture, blood (routine x 2)     Status: None   Collection Time: 10/30/21 12:09 PM   Specimen: BLOOD  Result Value Ref Range Status   Specimen Description BLOOD BLOOD RIGHT FOREARM  Final   Special Requests   Final    BOTTLES DRAWN AEROBIC AND  ANAEROBIC Blood Culture adequate volume   Culture   Final    NO GROWTH 5 DAYS Performed at [facility], [address] [place], [place] [address]    Report Status 11/04/2021 FINAL  Final  Culture, blood (routine x 2)     Status: None   Collection Time: 10/30/21 12:09 PM   Specimen: BLOOD  Result Value Ref Range Status   Specimen Description BLOOD BLOOD RIGHT HAND  Final   Special Requests   Final    BOTTLES DRAWN AEROBIC AND ANAEROBIC Blood Culture adequate volume   Culture   Final    NO GROWTH 5 DAYS Performed at [facility], [address], [place], [place] [address]    Report Status 11/04/2021 FINAL  Final         Radiology Studies: No results found.      Scheduled Meds:  vitamin C  500 mg Oral BID   docusate sodium  100 mg Oral Daily   FLUoxetine  40 mg Oral Daily   gabapentin  300 mg Oral BID   glycopyrrolate  1 mg Oral BID   heparin  5,000 Units Subcutaneous Q8H   insulin aspart  0-20 Units Subcutaneous TID WC   insulin aspart  3 Units Subcutaneous TID WC   insulin detemir  20 Units Subcutaneous BID   lisinopril  20 mg Oral Daily   multivitamin with minerals  1 tablet Oral Daily   nutrition supplement (JUVEN)  1 packet Oral BID BM   pravastatin  20 mg Oral q1800   Ensure Max Protein  11 oz Oral QHS   sodium chloride flush  3 mL Intravenous Q12H   traZODone  50 mg Oral QHS   zinc sulfate  220 mg Oral Daily   Continuous Infusions:  ampicillin-sulbactam (UNASYN) IV Stopped (  11/06/21 1610)   famotidine (PEPCID) IV 20 mg (11/06/21 1017)   magnesium sulfate bolus IVPB       LOS: 7 days     Tresa Moore, MD [facility]   If 7PM-7AM, please contact night-coverage  11/06/2021, 12:00 PM

## 2021-11-06 NOTE — Progress Notes (Signed)
Patient is not able to walk the distance required to go the bathroom, or he/she is unable to safely negotiate stairs required to access the bathroom.  A 3in1 BSC will alleviate this problem  

## 2021-11-06 NOTE — Progress Notes (Addendum)
Met with the patient to discuss DC plan and needs ?He lives at home with his adult sons, they work but are able to assist and to transport when needed ?He needs a light weight WC and a 3 in 1, [employer] notified and will deliver to the bedside ?[facility] notified of the [facility] needed RN and PT ?He can get his medication at [facility] where he is already a patient ? ?

## 2021-11-07 LAB — CREATININE, SERUM
Creatinine, Ser: 1.2 mg/dL (ref 0.61–1.24)
GFR, Estimated: >60 mL/min (ref >60)

## 2021-11-07 LAB — GLUCOSE, CAPILLARY
Glucose-Capillary: 84 mg/dL (ref 70–99)
Glucose-Capillary: 191 mg/dL — ABNORMAL HIGH (ref 70–99)

## 2021-11-07 MED ORDER — FAMOTIDINE 20 MG PO TABS: 20.0000 mg | ORAL_TABLET | Freq: Every day | ORAL | 1 refills | Status: AC

## 2021-11-07 MED ORDER — FAMOTIDINE 20 MG PO TABS
20.0000 mg | ORAL_TABLET | Freq: Two times a day (BID) | ORAL | Status: DC
  Administered 2021-11-07: 20 mg via ORAL
  Filled 2021-11-07: qty 1

## 2021-11-07 MED ORDER — ASCORBIC ACID 500 MG PO TABS: 500.0000 mg | ORAL_TABLET | Freq: Two times a day (BID) | ORAL | 1 refills | Status: AC

## 2021-11-07 MED ORDER — GLYCOPYRROLATE 1 MG PO TABS: 1.0000 mg | ORAL_TABLET | Freq: Two times a day (BID) | ORAL | 1 refills | Status: AC

## 2021-11-07 MED ORDER — OXYCODONE HCL 5 MG PO TABS: 5.0000 mg | ORAL_TABLET | Freq: Four times a day (QID) | ORAL | 0 refills | Status: AC | PRN

## 2021-11-07 MED ORDER — INSULIN GLARGINE 100 UNIT/ML ~~LOC~~ SOLN: 20.0000 [IU] | Freq: Every day | SUBCUTANEOUS | 2 refills | Status: AC

## 2021-11-07 MED ORDER — INSULIN GLARGINE 100 UNIT/ML ~~LOC~~ SOLN: 20.0000 [IU] | Freq: Two times a day (BID) | SUBCUTANEOUS | 2 refills | Status: DC

## 2021-11-07 NOTE — Progress Notes (Signed)
Physical Therapy Treatment ?Patient Details ?Name: Jesus Flowers ?MRN: [id number] ?DOB: 03/31/1966 ?Today's Date: 11/07/2021 ? ? ?History of Present Illness Pt is a 57 male s/p R BKA. PMH of HTN, smoking, DMII, back surgery. ? ?  ?PT Comments  ? ? Patient alert, agreeable to PT, reported 6/10 R BKA pain. He was able to hop with RW ~19ft with CGA-supervision.sit <> stand transfer with RW and supervision, cued for hand placement once. Session focused on safe stair navigation. Pt with improved foot clearance and ability to perform with unilateral rail, but did still have several instances of minA necessary for stabilizing. Pt was able to also perform backwards with RW, and improved safety noted. Performed twice, second attempt no physical steadying need, and family able to verbalize teach back technique, handout administered for instructions for home use. The patient would benefit from further skilled PT intervention to continue to progress towards goals. Recommendation remains appropriate.  ? ? ?   ?Recommendations for follow up therapy are one component of a multi-disciplinary discharge planning process, led by the attending physician.  Recommendations may be updated based on patient status, additional functional criteria and insurance authorization. ? ?Follow Up Recommendations ? Home health PT ?  ?  ?Assistance Recommended at Discharge Intermittent Supervision/Assistance  ?Patient can return home with the following A little help with bathing/dressing/bathroom;Assistance with cooking/housework;Assist for transportation;Help with stairs or ramp for entrance;Direct supervision/assist for medications management ?  ?Equipment Recommendations ? Wheelchair (measurements PT);Wheelchair cushion (measurements PT);BSC/3in1  ?  ?Recommendations for Other Services   ? ? ?  ?Precautions / Restrictions Precautions ?Precautions: Fall ?Restrictions ?Weight Bearing Restrictions: No ?Other Position/Activity Restrictions: R BKA  ?   ? ?Mobility ? Bed Mobility ?  ?  ?  ?  ?  ?  ?  ?General bed mobility comments: seated in recliner chair ?  ? ?Transfers ?Overall transfer level: Needs assistance ?Equipment used: Rolling walker (2 wheels) ?Transfers: Sit to/from Stand ?Sit to Stand: Supervision ?  ?  ?  ?  ?  ?  ?  ? ?Ambulation/Gait ?  ?Gait Distance (Feet): 50 Feet ?Assistive device: Rolling walker (2 wheels) ?  ?  ?  ?  ?General Gait Details: hop to gait pattern with no bouts of unsteadiness. shoe donned ? ? ?Stairs ?Stairs: Yes ?Stairs assistance: Min guard, Min assist ?Stair Management: One rail Right, Backwards, With walker ?  ?General stair comments: several bouts. ulitmately safest with RW backwards with family support. improved stair navigation from previous session though ? ? ?Wheelchair Mobility ?  ? ?Modified Rankin (Stroke Patients Only) ?  ? ? ?  ?Balance Overall balance assessment: Needs assistance ?Sitting-balance support: Feet supported ?Sitting balance-Leahy Scale: Good ?  ?  ?Standing balance support: Reliant on assistive device for balance ?Standing balance-Leahy Scale: Fair ?  ?  ?  ?  ?  ?  ?  ?  ?  ?  ?  ?  ?  ? ?  ?Cognition Arousal/Alertness: Awake/alert ?Behavior During Therapy: [facility] for tasks assessed/performed ?Overall Cognitive Status: Within Functional Limits for tasks assessed ?  ?  ?  ?  ?  ?  ?  ?  ?  ?  ?  ?  ?  ?  ?  ?  ?  ?  ?  ? ?  ?Exercises   ? ?  ?General Comments   ?  ?  ? ?Pertinent Vitals/Pain Pain Assessment ?Pain Assessment: 0-10 ?Pain Score: 6  ?Pain Location: R residual limb ?  Pain Descriptors / Indicators: Throbbing  ? ? ?Home Living   ?  ?  ?  ?  ?  ?  ?  ?  ?  ?   ?  ?Prior Function    ?  ?  ?   ? ?PT Goals (current goals can now be found in the care plan section) Progress towards PT goals: Progressing toward goals ? ?  ?Frequency ? ? ? 7X/week ? ? ? ?  ?PT Plan Current plan remains appropriate  ? ? ?Co-evaluation   ?  ?  ?  ?  ? ?  ?AM-PAC PT "6 Clicks" Mobility   ?Outcome Measure ? Help needed  turning from your back to your side while in a flat bed without using bedrails?: None ?Help needed moving from lying on your back to sitting on the side of a flat bed without using bedrails?: None ?Help needed moving to and from a bed to a chair (including a wheelchair)?: None ?Help needed standing up from a chair using your arms (e.g., wheelchair or bedside chair)?: None ?Help needed to walk in hospital room?: None ?Help needed climbing 3-5 steps with a railing? : A Lot ?6 Click Score: 22 ? ?  ?End of Session Equipment Utilized During Treatment: Gait belt ?Activity Tolerance: Patient tolerated treatment well ?Patient left: in chair;with call bell/phone within reach ?Nurse Communication: Mobility status ?PT Visit Diagnosis: Other abnormalities of gait and mobility (R26.89);Pain;Muscle weakness (generalized) (M62.81) ?Pain - Right/Left: Right ?Pain - part of body: Leg ?  ? ? ?Time: 1207-1230 ?PT Time Calculation (min) (ACUTE ONLY): 23 min ? ?Charges:  $Gait Training: 23-37 mins          ?          ? ?Olga Coaster PT, DPT ?1:36 PM,11/07/21 ? ? ?

## 2021-11-07 NOTE — Discharge Instructions (Signed)
Keep a log of his sugars at home for primary care physician to review ? ?Please take diabetes medications exactly how they are prescribed if you have any issues with your blood sugars reach out to your primary care physician. ?

## 2021-11-07 NOTE — Progress Notes (Signed)
Occupational Therapy Treatment ?Patient Details ?Name: Jesus Flowers ?MRN: [id number] ?DOB: 04/12/1966 ?Today's Date: 11/07/2021 ? ? ?History of present illness Pt is a 61 male s/p R BKA. PMH of HTN, smoking, DMII, back surgery. ?  ?OT comments ? Upon entering the room, pt supine in bed with 7/10 c/o pain in residual limb but agreeable to OT intervention. Pt dons UB clothing with set up A. He sits on EOB to thread pants onto B LEs and laterally leans to pull over B hips and fasten. Pt dons L shoe while seated in on EOB without LOB or assist. Pt stands from bed with supervision for safety and use of RW. He hops into bathroom for toileting needs. Min guard for standing balance with LB clothing management and toileting needs. OT educating pt on side stepping with RW in tight spaces and he demonstrates on his return to recliner chair with supervision overall. OT also discussing plans for bathing at home with pt reporting plans for basin bathing for safety.Energy conservation education discussed as well with pt verbalizing understanding. All needs within reach as therapist exits the room.   ? ?Recommendations for follow up therapy are one component of a multi-disciplinary discharge planning process, led by the attending physician.  Recommendations may be updated based on patient status, additional functional criteria and insurance authorization. ?   ?Follow Up Recommendations ? Home health OT  ?  ?Assistance Recommended at Discharge Intermittent Supervision/Assistance  ?Patient can return home with the following ? A little help with walking and/or transfers;A little help with bathing/dressing/bathroom;Help with stairs or ramp for entrance;Assist for transportation ?  ?Equipment Recommendations ? Wheelchair cushion (measurements OT);Wheelchair (measurements OT)  ?  ?   ?Precautions / Restrictions Precautions ?Precautions: Fall ?Restrictions ?Other Position/Activity Restrictions: R BKA  ? ? ?  ? ?Mobility Bed  Mobility ?Overal bed mobility: Modified Independent ?  ?  ?  ?  ?  ?  ?  ?  ? ?Transfers ?Overall transfer level: Needs assistance ?Equipment used: Rolling walker (2 wheels) ?Transfers: Sit to/from Stand ?Sit to Stand: Supervision ?  ?  ?  ?  ?  ?  ?  ?  ?Balance Overall balance assessment: Needs assistance ?Sitting-balance support: Feet supported ?Sitting balance-Leahy Scale: Good ?  ?  ?Standing balance support: Reliant on assistive device for balance ?Standing balance-Leahy Scale: Fair ?  ?  ?  ?  ?  ?  ?  ?  ?  ?  ?  ?  ?   ? ?ADL either performed or assessed with clinical judgement  ? ?ADL Overall ADL's : Needs assistance/impaired ?  ?  ?Grooming: Wash/dry hands;Standing;Min guard ?  ?  ?  ?  ?  ?Upper Body Dressing : Set up;Sitting ?  ?Lower Body Dressing: Sitting/lateral leans;Set up;Supervision/safety ?  ?Toilet Transfer: Supervision/safety;Ambulation;Rolling walker (2 wheels) ?  ?Toileting- Clothing Manipulation and Hygiene: Supervision/safety;Sitting/lateral lean ?  ?  ?  ?  ?  ?  ? ?Extremity/Trunk Assessment Upper Extremity Assessment ?Upper Extremity Assessment: Overall WFL for tasks assessed ?  ?  ?  ?  ?  ? ?Vision Patient Visual Report: No change from baseline ?  ?  ?   ?   ? ?Cognition Arousal/Alertness: Awake/alert ?Behavior During Therapy: [facility] for tasks assessed/performed ?Overall Cognitive Status: Within Functional Limits for tasks assessed ?  ?  ?  ?  ?  ?  ?  ?  ?  ?  ?  ?  ?  ?  ?  ?  ?  General Comments: Pt is pleasant, cooperative, and motivated to return home. ?  ?  ?   ?   ?   ?   ? ? ?Pertinent Vitals/ Pain       Pain Assessment ?Pain Assessment: 0-10 ?Pain Score: 7  ?Pain Location: R residual limb ?Pain Descriptors / Indicators: Throbbing ?Pain Intervention(s): Limited activity within patient's tolerance, Monitored during session, Premedicated before session, Repositioned ? ?   ?   ? ?Frequency ? Min 2X/week  ? ? ? ? ?  ?Progress Toward Goals ? ?OT Goals(current goals can now be found in  the care plan section) ? Progress towards OT goals: Progressing toward goals ? ?Acute Rehab OT Goals ?Patient Stated Goal: to go home ?OT Goal Formulation: With patient ?Time For Goal Achievement: 11/19/21 ?Potential to Achieve Goals: Good  ?Plan Discharge plan remains appropriate;Frequency remains appropriate   ? ?   ?AM-PAC OT "6 Clicks" Daily Activity     ?Outcome Measure ? ? Help from another person eating meals?: None ?Help from another person taking care of personal grooming?: None ?Help from another person toileting, which includes using toliet, bedpan, or urinal?: A Little ?Help from another person bathing (including washing, rinsing, drying)?: None ?Help from another person to put on and taking off regular upper body clothing?: None ?Help from another person to put on and taking off regular lower body clothing?: A Little ?6 Click Score: 22 ? ?  ?End of Session Equipment Utilized During Treatment: Rolling walker (2 wheels) ? ?OT Visit Diagnosis: Unsteadiness on feet (R26.81);Muscle weakness (generalized) (M62.81) ?  ?Activity Tolerance Patient tolerated treatment well ?  ?Patient Left in chair;with call bell/phone within reach ?  ?Nurse Communication Mobility status ?  ? ?   ? ?Time: 0923-3007 ?OT Time Calculation (min): 23 min ? ?Charges: OT General Charges ?$OT Visit: 1 Visit ?OT Treatments ?$Self Care/Home Management : 23-37 mins ? ?Jackquline Denmark, MS, OTR/L , CBIS ?ascom [phone]  ?11/07/21, 2:02 PM ? ?

## 2021-11-07 NOTE — Progress Notes (Signed)
Blood pressure 119/71, pulse 83, temperature 98.1 ?F (36.7 ?C), resp. rate 18, height 5\' 6"  (1.676 m), weight 77.6 kg, SpO2 94 %. ?IV removed site c/d/u discussed d/c packet with pt and son at bedside. Packet was in spanish as well, pt d/c with all dme needed and transported via w.c down to private car.  ?

## 2021-11-07 NOTE — Progress Notes (Signed)
PHARMACIST - PHYSICIAN COMMUNICATION ? ?DR:   patel ? ?CONCERNING: IV to Oral Route Change Policy ? ?RECOMMENDATION: ?This patient is receiving Famotidine by the intravenous route.  Based on criteria approved by the Pharmacy and Therapeutics Committee, the intravenous medication(s) is/are being converted to the equivalent oral dose form(s). ? ? ?DESCRIPTION: ?These criteria include: ?The patient is eating (either orally or via tube) and/or has been taking other orally administered medications for a least 24 hours ?The patient has no evidence of active gastrointestinal bleeding or impaired GI absorption (gastrectomy, short bowel, patient on TNA or NPO). ? ?If you have questions about this conversion, please contact the Pharmacy Department  ?[]   [phone] )  ( [phone] ?[x]   [phone] )  [facility] ?[]   [phone] )   [facility] ?[]   [phone] )  [facility] ?[]   [phone] )  [facility]  ? ?( [phone], RPH ?11/07/2021 8:21 AM ? ? ?

## 2021-11-07 NOTE — Progress Notes (Signed)
[facility] ? ?Daily Progress Note ? ? ?Subjective  -  ? ?Doing well. Pain control adequate. No events ? ?Objective ?Vitals:  ? 11/06/21 2039 11/07/21 0407 11/07/21 0435 11/07/21 0840  ?BP: 128/81 (!) 153/95  (!) 156/97  ?Pulse: 73 89  94  ?Resp: 18 18  18   ?Temp: 98.3 ?F (36.8 ?C) 98.9 ?F (37.2 ?C)  98.6 ?F (37 ?C)  ?TempSrc:      ?SpO2: 97% 97%  94%  ?Weight:   77.6 kg   ?Height:      ? ? ?Intake/Output Summary (Last 24 hours) at 11/07/2021 1034 ?Last data filed at 11/07/2021 1014 ?Gross per 24 hour  ?Intake 723 ml  ?Output --  ?Net 723 ml  ? ? ?PULM  CTAB ?CV  RRR ?VASC  Stump incision C/D/I ? ?Laboratory ?CBC ?   ?Component Value Date/Time  ? WBC 14.2 (H) 11/06/2021 0351  ? HGB 9.2 (L) 11/06/2021 0351  ? HCT 27.8 (L) 11/06/2021 0351  ? PLT 409 (H) 11/06/2021 0351  ? ? ?BMET ?   ?Component Value Date/Time  ? NA 136 11/06/2021 0351  ? K 4.6 11/06/2021 0351  ? CL 102 11/06/2021 0351  ? CO2 27 11/06/2021 0351  ? GLUCOSE 198 (H) 11/06/2021 0351  ? BUN 32 (H) 11/06/2021 0351  ? CREATININE 1.20 11/07/2021 0515  ? CALCIUM 8.6 (L) 11/06/2021 0351  ? GFRNONAA >60 11/07/2021 0515  ? ? ?Assessment/Planning: ?POD #3 s/p right BKA ? ?Doing well ?Stump is clean. ?Change dry dressing daily or every other day at home ?RTC 3 weeks for wound check ?OK to D/C from vascular POV ? ? ?01/07/2022 ? ?11/07/2021, 10:34 AM ? ? ? ?  ?

## 2021-11-07 NOTE — Progress Notes (Signed)
Nutrition Follow-up ? ?DOCUMENTATION CODES:  ? ?Not applicable ? ?INTERVENTION:  ? ?-Continue MVI with minerals daily ?-Continue 500 mg vitamin C BID ?-Continue 220 mg zinc sulfate daily x 14 days ?-Continue 1 packet Juven BID, each packet provides 95 calories, 2.5 grams of protein (collagen), and 9.8 grams of carbohydrate (3 grams sugar); also contains 7 grams of L-arginine and L-glutamine, 300 mg vitamin C, 15 mg vitamin E, 1.2 mcg vitamin B-12, 9.5 mg zinc, 200 mg calcium, and 1.5 g  Calcium Beta-hydroxy-Beta-methylbutyrate to support wound healing  ?-Continue Ensure Max po daily, each supplement provides 150 kcal and 30 grams of protein ?-Continue carb modified diet ? ?NUTRITION DIAGNOSIS:  ? ?Increased nutrient needs related to wound healing as evidenced by estimated needs. ? ?Ongoing ? ?GOAL:  ? ?Patient will meet greater than or equal to 90% of their needs ? ?Progressing  ? ?MONITOR:  ? ?PO intake, Supplement acceptance, Diet advancement, Labs, Weight trends, Skin, I & O's ? ?REASON FOR ASSESSMENT:  ? ?Consult ?Assessment of nutrition requirement/status, Wound healing ? ?ASSESSMENT:  ? ?Jesus Flowers is a 56 y.o. 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 Patient then returns 07/2021 with foot infection and diagnosed with residual osteomyelitis at site of R foot TMA. 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 Patient now returns to ed after follow with podiatry 10/30/22 with diagnosis of "severe dehiscence and necrosis of wound and concern for osteomyelitis of the remaining talus and calcaneus, probable septic arthritis of  subtalar and as well as ankle." Per podiatry noted. Per podiatry will need BKA .Patient was referred to ED yesterday but presented today. Per  patient he noted worsening pain s/p fall and injury 3-4 days ago.  He notes increase swelling drainage and odor. He notes associated chills but no fever, n/v/d. ON further ros no sob/ chest pain / cough / uri signs and symptoms. He notes he is complaint with is diabetic medication. ? ?4/7- Procedure(s): ?Lower Extremity Angiography (Right)  ?4/9- s/p rt BKA ? ?Reviewed I/O's: +723 ml x 24 hours and +8.8 L since admission  ? ?Pt out of room at time of visit.  ? ?Pt with good appetite. Noted meal completions 100%. Pt is compliant with supplements.  ? ?Plan to discharge home with home health today.  ? ?Medications reviewed and include vitamin C, colace, and zinc sulfate.  ? ?Labs reviewed: CBGS: 83-191 (inpatient orders for glycemic control are 0-20 units insulin aspart TID with meals, 3 units insulin aspart daily with meals, and 20 units insulin detemir BID).   ? ?Diet Order:   ?Diet Order   ? ?       ?  Diet - low sodium heart healthy       ?  ?  Diet Carb Modified Fluid consistency: Thin; Room service appropriate? Yes  Diet effective now       ?  ? ?  ?  ? ?  ? ? ?EDUCATION NEEDS:  ? ?Education needs have been addressed ? ?Skin:  Skin Assessment: Skin Integrity Issues: ?Skin Integrity Issues:: Incisions ?Incisions: s/p rt BKA ? ?Last BM:  11/07/21 ? ?Height:  ? ?Ht Readings from Last 1 Encounters:  ?10/30/21 5\' 6"  (1.676 m)  ? ? ?Weight:  ? ?Wt Readings from Last 1 Encounters:  ?  11/07/21 77.6 kg  ? ?BMI:  Body mass index is 27.61 kg/m?. ? ?Estimated Nutritional Needs:  ? ?Kcal:  1750-1950 ? ?Protein:  90-105 grams ? ?Fluid:  > 1.7 L ? ? ? ?Levada Schilling, RD, LDN, CDCES ?Registered Dietitian II ?Certified Diabetes Care and Education Specialist ?Please refer to [facility] for RD and/or RD on-call/weekend/after hours pager  ?

## 2021-11-07 NOTE — Discharge Summary (Signed)
Physician Discharge Summary   Patient: Jesus Flowers MRN: [id number] DOB: 1965/09/23  Admit date:     10/30/2021  Discharge date: 11/07/21  Discharge Physician: Enedina Finner   PCP: [facility]   Recommendations at discharge:   keep log of his sugars at home and take your diabetes medicines as prescribed. If you have questions regarding your sugars call your primary care physician follow-up with vascular surgery Rickard Patience nurse practitioner in three weeks follow-up with your primary care physician in 1 to 2 week  Discharge Diagnoses: osteomyelitis right ankle with complicated soft tissue infections status post right BKA Hospital Course: 56 y.o. 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 Patient then returns 07/2021 with foot infection and diagnosed with residual osteomyelitis at site of R foot TMA. 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 Patient now returns to ed after follow with podiatry 10/30/22 with diagnosis of "severe dehiscence and necrosis of wound and concern for osteomyelitis of the remaining talus and calcaneus, probable septic arthritis of  subtalar and as well as ankle  osteomyelitis right ankle severe necrosis with complicated soft tissue infection -- patient was seen by podiatry and vascular surgery. -- MRI with evidence of osteomyelitis -- podiatry cannot offer anything else hands vascular surgery was consulted. Patient underwent lower extremity angiogram on 4/7 -- completed IV antibiotic with Unasyn -- ID consult appreciated -- right BKA 11/04/21 -- dressing change done today by Dr. Wyn Quaker. Home health will continue daily dressing as per instruction.  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  Acute kidney injury -- resolved  History of alcohol use No evidence of withdrawal No indication for CIWA protocol   Hyperlipidemia PTA statin   Depression PTA fluoxetine   Hypertension PTA lisinopril         Consultants: Vascular, ID, podiatry Procedures performed: lower extremity angiogram and right BKA Disposition: Home health Diet recommendation:  Discharge Diet Orders (From admission, onward)     Start     Ordered   11/07/21 0000  Diet - low sodium heart healthy        11/07/21 1056           Cardiac and Carb modified diet DISCHARGE MEDICATION: Allergies as of 11/07/2021   No Known Allergies      Medication List     STOP taking these medications    Jardiance 25 MG Tabs tablet Generic drug: empagliflozin       TAKE these medications    Acetaminophen Extra Strength 500 MG tablet Generic drug: acetaminophen Take by mouth.   ascorbic acid 500 MG tablet Commonly known as: VITAMIN C Take 1 tablet (500 mg total) by mouth 2 (two) times daily.   famotidine 20 MG tablet Commonly known as: PEPCID Take 1 tablet (20 mg total) by mouth daily.   FLUoxetine 40 MG capsule Commonly known as: PROZAC Take 40 mg by mouth daily as needed.   gabapentin 300 MG capsule Commonly known as: NEURONTIN Take by mouth.   glycopyrrolate 1 MG tablet Commonly known as: ROBINUL Take 1 tablet (1 mg  total) by mouth 2 (two) times daily.   insulin glargine 100 UNIT/ML injection Commonly known as: LANTUS Inject 0.2 mLs (20 Units total) into the skin daily. What changed: how much to take   lisinopril 20 MG tablet Commonly known as: ZESTRIL Take 20 mg by mouth daily. What changed: Another medication with the same name was removed. Continue taking this  medication, and follow the directions you see here.   lovastatin 20 MG tablet Commonly known as: MEVACOR Take 20 mg by mouth daily.   multivitamin with minerals Tabs tablet Take 1 tablet by mouth daily.   oxyCODONE 5 MG immediate release tablet Commonly known as: Oxy IR/ROXICODONE Take 1 tablet (5 mg total) by mouth 4 (four) times daily as needed.   sitaGLIPtin-metformin 50-1000 MG tablet Commonly known as: JANUMET Take 1 tablet by mouth 2 (two) times daily with a meal.   traZODone 50 MG tablet Commonly known as: DESYREL Take 50 mg by mouth at bedtime.               Durable Medical Equipment  (From admission, onward)           Start     Ordered   11/06/21 1012  For home use only DME lightweight manual wheelchair with seat cushion  Once       Comments: Patient suffers from Amputation which impairs their ability to perform daily activities like ADLs in the home.  A walking aide will not resolve  issue with performing activities of daily living. A wheelchair will allow patient to safely perform daily activities. Patient is not able to propel themselves in the home using a standard weight wheelchair due to weakness. Patient can self propel in the lightweight wheelchair. Length of need 12 months. Accessories: elevating leg rests (ELRs), wheel locks, extensions and anti-tippers. Back Cushion   11/06/21 1012              Discharge Care Instructions  (From admission, onward)           Start     Ordered   11/07/21 0000  Discharge wound care:       Comments: Dressing should be changed before he goes, typically we  place ABD over stump with kerlix and then replace the ACE bandage. This should be changed daily.   11/07/21 1056            Follow-up Information     Georgiana Spinner, NP Follow up in 3 week(s).   Specialty: Vascular Surgery Why: For wound re-check, For suture removal Contact information: [address] [place] [address] [phone]          [facility]. Schedule an appointment as soon as possible for a visit in 1 week(s).   Why: s/o Right BKA Contact information: [address] [place] [address] [phone]                Discharge Exam: Filed Weights   11/05/21 0500 11/06/21 0500 11/07/21 0435  Weight: 80.5 kg 78.9 kg 77.6 kg   right BKA dressing present  Condition at discharge: fair  The results of significant diagnostics from this hospitalization (including imaging, microbiology, ancillary and laboratory) are listed below for reference.   Imaging Studies: MR ANKLE RIGHT W WO CONTRAST  Result Date: 10/30/2021 CLINICAL DATA:  Status post Chopart's amputation present with progressive right foot infection/cellulitis. EXAM: MRI OF THE RIGHT ANKLE WITHOUT AND WITH CONTRAST TECHNIQUE: Multiplanar, multisequence MR imaging of the ankle was  performed before and after the administration of intravenous contrast. CONTRAST:  7mL GADAVIST GADOBUTROL 1 MMOL/ML IV SOLN COMPARISON:  None. FINDINGS: Bones: Status post prior 2 parts amputation. There is bone marrow edema about the talar head and neck. There is also edema of the anterior process of the calcaneus. Soft Tissue: There is a peripherally enhancing fluid collection adjacent to the anterior process of calcaneus measuring at least 2.0 x 1.7 x 1.7 cm with a sinus track extending anterior aspect of the stump. There is another pocket which extends from the above-mentioned collection to the anterior aspect of the calcaneus measuring 1.6 x 0.8 cm. There is another peripherally enhancing fluid collection about inferomedial aspect of the calcaneus measuring approximately 2.9 x 2.2 x 1.6 cm. Another peripherally enhancing fluid collection about the medial aspect of the prior amputation site measuring at least 2.3 x 1.0 by 2.6 cm. TENDONS Peroneal: Peroneus longus and brevis are intact with surrounding edema. Posteromedial: Posterior tibial tendon intact.  Flexor hallucis longus tendon intact. Flexor digitorum longus tendon intact. Trace amount of fluid along the tibialis posterior and flexor digitorum. Anterior: Tibialis anterior tendon intact. Extensor hallucis longus tendon intact Extensor digitorum longus tendon intact. Achilles:  Intact. Plantar Fascia: Intact. Scratch third LIGAMENTS Lateral: Anterior talofibular ligament intact. Calcaneofibular ligament intact. Posterior talofibular ligament intact. Anterior and posterior tibiofibular ligaments intact. Medial: Deltoid ligament intact. Spring ligament intact. CARTILAGE Ankle Joint: Small joint effusion joint effusion. Normal ankle mortise. No chondral defect. Subtalar Joints/Sinus Tarsi: Normal subtalar joints. No subtalar joint effusion. IMPRESSION: IMPRESSION 1. At least three peripherally enhancing abscesses about the anterior and medial aspect of the ankle with sinus tracts with marked edema of the stump site and phlegmonous changes. Surgical consultation for further management is recommended. 2. Bone marrow edema of the anterior calcaneal process as well as talar head and neck consistent with acute osteomyelitis. 3. Skin thickening and generalized subcutaneous soft tissue edema as well as edema of the muscles of the foot concerning for myositis. Electronically Signed   By: Larose Hires D.O.   On: 10/30/2021 22:54   PERIPHERAL VASCULAR CATHETERIZATION  Result Date: 11/02/2021 See surgical note for result.  US ARTERIAL ABI (SCREENING LOWER EXTREMITY)  Result Date: 10/31/2021 CLINICAL DATA:  56 year old male with history of partial right foot amputation. EXAM: NONINVASIVE PHYSIOLOGIC VASCULAR STUDY OF BILATERAL LOWER EXTREMITIES TECHNIQUE: Evaluation of both lower extremities were performed at rest, including calculation of ankle-brachial indices with single level Doppler, pressure and pulse volume recording. COMPARISON:  None. FINDINGS: Right ABI:  Noncompressible. Left ABI:  1.31 Right Lower Extremity:   Biphasic posterior tibial waveforms. Left Lower Extremity:  Biphasic arterial waveforms at the ankle. IMPRESSION: 1. Noncompressible right ankle brachial index. 2. Normal (1.31) left ankle brachial index. Consider lower extremity arterial duplex, CTA runoff, or direct angiography for further characterization. Marliss Coots, MD [facility] Electronically Signed   By: Marliss Coots M.D.   On: 10/31/2021 10:27   DG Foot Complete Right  Result Date: 10/30/2021 CLINICAL DATA:  History of partial right foot amputation few months ago. Pain and swelling. EXAM: RIGHT FOOT COMPLETE - 3+ VIEW COMPARISON:  08/14/2021 FINDINGS: Diffuse subcutaneous soft tissue swelling/edema noted around the hindfoot. There is a soft tissue defect containing gas medially which is likely an open wound. No definite destructive bony changes to suggest osteomyelitis. Extensive vascular calcifications. IMPRESSION: 1. Air containing medial soft tissue defect likely an open wound. 2. No definite plain film findings for osteomyelitis.  3. Diffuse subcutaneous soft tissue swelling/edema. Electronically Signed   By: Rudie Meyer M.D.   On: 10/30/2021 11:22   [place] OR NERVE BLOCK-IMAGE ONLY [facility])  Result Date: 11/04/2021 There is no interpretation for this exam.  This order is for images obtained during a surgical procedure.  Please See "Surgeries" Tab for more information regarding the procedure.    Microbiology: Results for orders placed or performed during the hospital encounter of 10/30/21  Culture, blood (routine x 2)     Status: None   Collection Time: 10/30/21 12:09 PM   Specimen: BLOOD  Result Value Ref Range Status   Specimen Description BLOOD BLOOD RIGHT FOREARM  Final   Special Requests   Final    BOTTLES DRAWN AEROBIC AND ANAEROBIC Blood Culture adequate volume   Culture   Final    NO GROWTH 5 DAYS Performed at [facility], [address] [place],  [place] [address]    Report Status 11/04/2021 FINAL  Final  Culture, blood (routine x 2)     Status: None   Collection Time: 10/30/21 12:09 PM   Specimen: BLOOD  Result Value Ref Range Status   Specimen Description BLOOD BLOOD RIGHT HAND  Final   Special Requests   Final    BOTTLES DRAWN AEROBIC AND ANAEROBIC Blood Culture adequate volume   Culture   Final    NO GROWTH 5 DAYS Performed at [facility], [address] [place], [place] [address]    Report Status 11/04/2021 FINAL  Final    Labs: CBC: Recent Labs  Lab 11/04/21 0410 11/04/21 1159 11/05/21 0421 11/06/21 0351  WBC 11.1* 13.4* 15.6* 14.2*  NEUTROABS 8.2*  --   --   --   HGB 8.7* 10.0* 9.4* 9.2*  HCT 26.4* 30.1* 28.3* 27.8*  MCV 92.6 93.2 93.4 93.3  PLT 360 401* 376 409*   Basic Metabolic Panel: Recent Labs  Lab 11/02/21 0027 11/04/21 0410 11/04/21 1159 11/05/21 0421 11/06/21 0351 11/07/21 0515  NA  --  137  --  134* 136  --   K  --  4.4  --  4.4 4.6  --   CL  --  105  --  102 102  --   CO2  --  28  --  26 27  --   GLUCOSE  --  112*  --  316* 198*  --   BUN 28* 27*  --  33* 32*  --   CREATININE 1.18 1.06 0.91 1.25* 1.10 1.20  CALCIUM  --  8.4*  --  8.4* 8.6*  --    Liver Function Tests: No results for input(s): AST, ALT, ALKPHOS, BILITOT, PROT, ALBUMIN in the last 168 hours. CBG: Recent Labs  Lab 11/05/21 2111 11/06/21 0800 11/06/21 1154 11/06/21 1658 11/07/21 0840  GLUCAP 117* 167* 155* 83 84    Discharge time spent: greater than 30 minutes.  Signed: Enedina Finner, MD [facility] 11/07/2021

## 2021-12-19 ENCOUNTER — Encounter: Payer: Self-pay | Admitting: Medical Oncology

## 2021-12-19 ENCOUNTER — Emergency Department
Admission: EM | Admit: 2021-12-19 | Discharge: 2021-12-19 | Disposition: A | Discharge status: Home / Self Care | Payer: Self-pay | Attending: Emergency Medicine | Admitting: Emergency Medicine

## 2021-12-19 DIAGNOSIS — Z4802 Encounter for removal of sutures: Secondary | ICD-10-CM | POA: Insufficient documentation

## 2021-12-19 DIAGNOSIS — Z760 Encounter for issue of repeat prescription: Secondary | ICD-10-CM | POA: Insufficient documentation

## 2021-12-19 MED ORDER — OXYCODONE-ACETAMINOPHEN 5-325 MG PO TABS
1.0000 | ORAL_TABLET | Freq: Once | ORAL | Status: AC
  Administered 2021-12-19: 1 via ORAL
  Filled 2021-12-19: qty 1

## 2021-12-19 NOTE — ED Notes (Signed)
ABD pad and gauze wrap applied to right stump by this RN and Faith, EDT.

## 2021-12-19 NOTE — ED Notes (Signed)
Pt here for staple removal and pain medication. Pt states that he had his right leg amputated and that when he was discharged he was told that someone from the office would call and give him an appt to have staples removed but no one ever called him. Pt states that he went to his PCP office this morning and asked them to the remove the staples but they told him that he needed to come to the ED and have them removed here. Pt states that he also asked them for pain medication because he is still having pain in the area but they told him that he would need to get that from the ED as well.  Pt wound appears to have healed without complication. Pt does not have any redness or draining at the incision. Staples remain intact.

## 2021-12-19 NOTE — Discharge Instructions (Signed)
Follow up with dr dew's office, please call for an appointment Try melatonin to help you sleep, tylenol for pain as needed

## 2021-12-19 NOTE — ED Provider Notes (Signed)
[facility] Provider Note    Event Date/Time   First MD Initiated Contact with Patient 12/19/21 1201     (approximate)   History   Suture / Staple Removal and Medication Refill   HPI  Jesus Flowers is a 57 y.o. male presents emergency department with concerns of staple removal.  Patient had BKA in April.  States that he went to his PCP to have staples removed they sent him to the ER.  Patient states he has not had a recheck visit with vascular.  He denies fever or chills.  He is complaining of pain and would like pain medication.      Physical Exam   Triage Vital Signs: ED Triage Vitals  Enc Vitals Group     BP 12/19/21 1123 (!) 171/85     Pulse Rate 12/19/21 1123 94     Resp 12/19/21 1123 16     Temp 12/19/21 1123 98.3 F (36.8 C)     Temp Source 12/19/21 1123 Oral     SpO2 12/19/21 1123 98 %     Weight 12/19/21 1124 169 lb 12.1 oz (77 kg)     Height --      Head Circumference --      Peak Flow --      Pain Score 12/19/21 1124 8     Pain Loc --      Pain Edu? --      Excl. in GC? --     Most recent vital signs: Vitals:   12/19/21 1123  BP: (!) 171/85  Pulse: 94  Resp: 16  Temp: 98.3 F (36.8 C)  SpO2: 98%     General: Awake, no distress.   CV:  Good peripheral perfusion. regular rate and  rhythm Resp:  Normal effort.  Abd:  No distention.   Other:  Stump appears to be free of infection, staples are intact, no pus or drainage is noted   ED Results / Procedures / Treatments   Labs (all labs ordered are listed, but only abnormal results are displayed) Labs Reviewed - No data to display   EKG     RADIOLOGY     PROCEDURES:   .Suture Removal  Date/Time: 12/19/2021 12:38 PM Performed by: Faythe Ghee, PA-C Authorized by: Faythe Ghee, PA-C   Consent:    Consent obtained:  Verbal   Consent given by:  Patient   Risks, benefits, and alternatives were discussed: yes     Risks discussed:  Bleeding, pain  and wound separation   Alternatives discussed:  No treatment Universal protocol:    Procedure explained and questions answered to patient or proxy's satisfaction: yes     Immediately prior to procedure, a time out was called: yes     Patient identity confirmed:  Verbally with patient Location:    Location:  Lower extremity   Lower extremity location:  Leg   Leg location:  L lower leg Procedure details:    Wound appearance:  No signs of infection   Number of staples removed:  23 Post-procedure details:    Post-removal:  Dressing applied   Procedure completion:  Tolerated well, no immediate complications   MEDICATIONS ORDERED IN ED: Medications  oxyCODONE-acetaminophen (PERCOCET/ROXICET) 5-325 MG per tablet 1 tablet (has no administration in time range)     IMPRESSION / MDM / ASSESSMENT AND PLAN / ED COURSE  I reviewed the triage vital signs and the nursing notes.  Differential diagnosis includes, but is not limited to, wound infection, staple removal, chronic pain,   Due to this being a surgical BKA I did try to reach out to vascular.  Call Dr. Gilda Crease who is on-call for vascular.  Says it is okay for [place] to remove staples and have patient follow-up in his office.  Patient was given oxycodone for pain.  23 staples were removed.  He was discharged stable condition.         FINAL CLINICAL IMPRESSION(S) / ED DIAGNOSES   Final diagnoses:  Encounter for staple removal     Rx / DC Orders   ED Discharge Orders     None        Note:  This document was prepared using Dragon voice recognition software and may include unintentional dictation errors.    Faythe Ghee, PA-C 12/19/21 1239    Concha Se, MD 12/20/21 [phone]

## 2021-12-19 NOTE — ED Triage Notes (Signed)
Pt reports he had rt BKA over a month ago and since has been following up but was told to come to ED to have staples removed from area. Pt also reports he has been having pain but no one will give him pain meds. Pt denies issues, no fever.

## 2022-01-08 ENCOUNTER — Ambulatory Visit (INDEPENDENT_AMBULATORY_CARE_PROVIDER_SITE_OTHER): Payer: Self-pay | Admitting: Vascular Surgery

## 2022-01-22 ENCOUNTER — Ambulatory Visit (INDEPENDENT_AMBULATORY_CARE_PROVIDER_SITE_OTHER): Payer: Self-pay | Admitting: Vascular Surgery

## 2022-02-05 ENCOUNTER — Ambulatory Visit (INDEPENDENT_AMBULATORY_CARE_PROVIDER_SITE_OTHER): Payer: Self-pay | Admitting: Vascular Surgery

## 2022-02-05 ENCOUNTER — Encounter (INDEPENDENT_AMBULATORY_CARE_PROVIDER_SITE_OTHER): Payer: Self-pay | Admitting: Vascular Surgery

## 2022-02-05 VITALS — BP 175/106 | HR 91 | Resp 16

## 2022-02-05 DIAGNOSIS — I10 Essential (primary) hypertension: Secondary | ICD-10-CM

## 2022-02-05 DIAGNOSIS — E11621 Type 2 diabetes mellitus with foot ulcer: Secondary | ICD-10-CM

## 2022-02-05 DIAGNOSIS — E1152 Type 2 diabetes mellitus with diabetic peripheral angiopathy with gangrene: Secondary | ICD-10-CM

## 2022-02-05 DIAGNOSIS — Z89511 Acquired absence of right leg below knee: Secondary | ICD-10-CM

## 2022-02-05 DIAGNOSIS — L97509 Non-pressure chronic ulcer of other part of unspecified foot with unspecified severity: Secondary | ICD-10-CM

## 2022-02-05 NOTE — Assessment & Plan Note (Signed)
The patient has a right below-knee amputation with a small superficial ulceration after a fall.  This is about 30 months old.  Going to do a Xeroform and a gauze and hopefully this will be healed in the next week to 10 days and we can get him a prosthesis evaluation.  We will see him back in about 2 weeks to recheck the area.

## 2022-02-05 NOTE — Assessment & Plan Note (Signed)
blood pressure control important in reducing the progression of atherosclerotic disease. On appropriate oral medications.  

## 2022-02-05 NOTE — Progress Notes (Signed)
MRN : [id number]  Jesus Flowers is a 56 y.o. (Oct 01, 1965) male who presents with chief complaint of  Chief Complaint  Patient presents with   Follow-up    Check right stump   .  History of Present Illness: Patient returns today in follow up of his right below-knee amputation.  He is a little over 3 months status post right below-knee amputation for severe infection of the right foot and ankle in a nonsalvageable situation.  This had healed fairly well although last week he fell on it and has opened a small wound on the anterior portion of the incision.  He has been walking with a walker.  He had not had any pain until he developed a small wound.  On exam today, the wound is small and superficial but it is in the midportion of the wound in an area that is just over the tibial prominence.  There is no purulence or erythema.  Current Outpatient Medications  Medication Sig Dispense Refill   ACETAMINOPHEN EXTRA STRENGTH 500 MG tablet Take by mouth.     ascorbic acid (VITAMIN C) 500 MG tablet Take 1 tablet (500 mg total) by mouth 2 (two) times daily. 60 tablet 1   famotidine (PEPCID) 20 MG tablet Take 1 tablet (20 mg total) by mouth daily. 30 tablet 1   FLUoxetine (PROZAC) 40 MG capsule Take 40 mg by mouth daily as needed.     gabapentin (NEURONTIN) 300 MG capsule Take by mouth.     glycopyrrolate (ROBINUL) 1 MG tablet Take 1 tablet (1 mg total) by mouth 2 (two) times daily. 60 tablet 1   insulin glargine (LANTUS) 100 UNIT/ML injection Inject 0.2 mLs (20 Units total) into the skin daily. (Patient taking differently: Inject 50 Units into the skin daily.) 6 mL 2   lisinopril (ZESTRIL) 20 MG tablet Take 20 mg by mouth daily.     lovastatin (MEVACOR) 20 MG tablet Take 20 mg by mouth daily.     Multiple Vitamin (MULTIVITAMIN WITH MINERALS) TABS tablet Take 1 tablet by mouth daily.     sitaGLIPtin-metformin (JANUMET) 50-1000 MG tablet Take 1 tablet by mouth 2 (two) times daily with a meal.      traZODone (DESYREL) 50 MG tablet Take 50 mg by mouth at bedtime.     oxyCODONE (OXY IR/ROXICODONE) 5 MG immediate release tablet Take 1 tablet (5 mg total) by mouth 4 (four) times daily as needed. (Patient not taking: Reported on 02/05/2022) 25 tablet 0   No current facility-administered medications for this visit.    Past Medical History:  Diagnosis Date   Depression    Diabetes mellitus without complication (HCC)    High cholesterol     Past Surgical History:  Procedure Laterality Date   AMPUTATION Right 08/14/2021   Procedure: AMPUTATION FOOT;  Surgeon: Felecia Shelling, DPM;  Location: [facility];  Service: Podiatry;  Laterality: Right;   AMPUTATION Right 11/04/2021   Procedure: AMPUTATION BELOW KNEE;  Surgeon: Learta Codding, MD;  Location: [facility];  Service: Vascular;  Laterality: Right;   BACK SURGERY     INCISION AND DRAINAGE Right 08/14/2021   Procedure: INCISION AND DRAINAGE;  Surgeon: Felecia Shelling, DPM;  Location: [facility];  Service: Podiatry;  Laterality: Right;   IRRIGATION AND DEBRIDEMENT FOOT Right 05/06/2021   Procedure: IRRIGATION AND DEBRIDEMENT FOOT;  Surgeon: [name], DPM;  Location: [facility];  Service: Podiatry;  Laterality: Right;   LOWER EXTREMITY ANGIOGRAPHY Right 11/02/2021  Procedure: Lower Extremity Angiography;  Surgeon: Annice Needy, MD;  Location: [facility];  Service: Cardiovascular;  Laterality: Right;   TRANSMETATARSAL AMPUTATION Right 05/09/2021   Procedure: TRANSMETATARSAL AMPUTATION;  Surgeon: [name], DPM;  Location: [facility];  Service: Podiatry;  Laterality: Right;     Social History   Tobacco Use   Smoking status: Never   Smokeless tobacco: Never  Vaping Use   Vaping Use: Never used  Substance Use Topics   Alcohol use: Yes   Drug use: Never       Family History  Problem Relation Age of Onset   Diabetes Mellitus II Sister      No Known Allergies   REVIEW OF SYSTEMS (Negative unless  checked)  Constitutional: [] Weight loss  [] Fever  [] Chills Cardiac: [] Chest pain   [] Chest pressure   [] Palpitations   [] Shortness of breath when laying flat   [] Shortness of breath at rest   [] Shortness of breath with exertion. Vascular:  [] Pain in legs with walking   [] Pain in legs at rest   [] Pain in legs when laying flat   [] Claudication   [] Pain in feet when walking  [] Pain in feet at rest  [] Pain in feet when laying flat   [] History of DVT   [] Phlebitis   [] Swelling in legs   [] Varicose veins   [x] Non-healing ulcers Pulmonary:   [] Uses home oxygen   [] Productive cough   [] Hemoptysis   [] Wheeze  [] COPD   [] Asthma Neurologic:  [] Dizziness  [] Blackouts   [] Seizures   [] History of stroke   [] History of TIA  [] Aphasia   [] Temporary blindness   [] Dysphagia   [] Weakness or numbness in arms   [] Weakness or numbness in legs Musculoskeletal:  [x] Arthritis   [] Joint swelling   [] Joint pain   [] Low back pain Hematologic:  [] Easy bruising  [] Easy bleeding   [] Hypercoagulable state   [] Anemic   Gastrointestinal:  [] Blood in stool   [] Vomiting blood  [] Gastroesophageal reflux/heartburn   [] Abdominal pain Genitourinary:  [] Chronic kidney disease   [] Difficult urination  [] Frequent urination  [] Burning with urination   [] Hematuria Skin:  [] Rashes   [x] Ulcers   [x] Wounds Psychological:  [] History of anxiety   []  History of major depression.  Physical Examination  BP (!) 175/106 (BP Location: Left Arm)   Pulse 91   Resp 16  Gen:  WD/WN, NAD Head: [place]/AT, No temporalis wasting. Ear/Nose/Throat: Hearing grossly intact, nares w/o erythema or drainage Eyes: Conjunctiva clear. Sclera non-icteric Neck: Supple.  Trachea midline Pulmonary:  Good air movement, no use of accessory muscles.  Cardiac: RRR, no JVD Vascular:  Vessel Right Left  Radial Palpable Palpable                   Musculoskeletal: M/S 5/5 throughout.  No deformity or atrophy.  Right below-knee amputation stump as above.  Otherwise  healed.  No erythema with minimal bloody drainage.  No significant lower extremity edema. Neurologic: Sensation grossly intact in extremities.  Symmetrical.  Speech is fluent.  Psychiatric: Judgment intact, Mood & affect appropriate for pt's clinical situation. Dermatologic: Wound on the anterior portion of the right below-knee amputation as above      Labs No results found for this or any previous visit (from the past 2160 hour(s)).  Radiology No results found.  Assessment/Plan  Hx of BKA, right (HCC) The patient has a right below-knee amputation with a small superficial ulceration after a fall.  This is about 87 months old.  Going to do a Xeroform and a gauze and hopefully this will be healed in the next week to 10 days and we can get him a prosthesis evaluation.  We will see him back in about 2 weeks to recheck the area.  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 blood glucose control important in reducing the progression of atherosclerotic disease. Also, involved in wound healing. On appropriate medications.   HTN (hypertension) blood pressure control important in reducing the progression of atherosclerotic disease. On appropriate oral medications.    Festus Barren, MD  02/05/2022 5:18 PM    This note was created with Dragon medical transcription system.  Any errors from dictation are purely unintentional

## 2022-02-05 NOTE — Assessment & Plan Note (Signed)
Now s/p right BKA. blood glucose control important in reducing the progression of atherosclerotic disease. Also, involved in wound healing. On appropriate medications.

## 2022-02-19 ENCOUNTER — Encounter (INDEPENDENT_AMBULATORY_CARE_PROVIDER_SITE_OTHER): Payer: Self-pay | Admitting: Nurse Practitioner

## 2022-02-19 ENCOUNTER — Ambulatory Visit (INDEPENDENT_AMBULATORY_CARE_PROVIDER_SITE_OTHER): Payer: Self-pay | Admitting: Nurse Practitioner

## 2022-02-19 VITALS — BP 170/100 | HR 93 | Resp 16

## 2022-02-19 DIAGNOSIS — Z89511 Acquired absence of right leg below knee: Secondary | ICD-10-CM

## 2022-02-19 DIAGNOSIS — L97509 Non-pressure chronic ulcer of other part of unspecified foot with unspecified severity: Secondary | ICD-10-CM

## 2022-02-19 DIAGNOSIS — E1152 Type 2 diabetes mellitus with diabetic peripheral angiopathy with gangrene: Secondary | ICD-10-CM

## 2022-02-19 DIAGNOSIS — I10 Essential (primary) hypertension: Secondary | ICD-10-CM

## 2022-02-19 DIAGNOSIS — E11621 Type 2 diabetes mellitus with foot ulcer: Secondary | ICD-10-CM

## 2022-02-19 NOTE — Progress Notes (Signed)
Subjective:    Patient ID: Jesus Flowers, male    DOB: 12-28-1965, 56 y.o.   MRN: [id number] Chief Complaint  Patient presents with   Wound Check    2 weeks wound check    Patient returns today in follow up of his right below-knee amputation.  He is a little over 3 months status post right below-knee amputation for severe infection of the right foot and ankle in a nonsalvageable situation.  This had healed fairly well although last week he fell on it and has opened a small wound on the anterior portion of the incision.  He has been walking with a walker.  He had not had any pain until he developed a small wound.  Today there is just a small scabbed over incision area in the midportion of the previous incision.  This area is very shallow.  There is no purulence or erythema.    Review of Systems  Musculoskeletal:  Positive for gait problem.  Skin:  Positive for wound.  All other systems reviewed and are negative.      Objective:   Physical Exam Vitals reviewed.  HENT:     Head: Normocephalic.  Cardiovascular:     Rate and Rhythm: Normal rate.     Pulses: Normal pulses.  Pulmonary:     Effort: Pulmonary effort is normal.  Musculoskeletal:     Right Lower Extremity: Right leg is amputated below knee.  Skin:    General: Skin is warm and dry.  Neurological:     Mental Status: He is alert and oriented to person, place, and time.  Psychiatric:        Mood and Affect: Mood normal.        Behavior: Behavior normal.        Thought Content: Thought content normal.        Judgment: Judgment normal.     BP (!) 170/100 (BP Location: Right Arm)   Pulse 93   Resp 16   Past Medical History:  Diagnosis Date   Depression    Diabetes mellitus without complication (HCC)    High cholesterol     Social History   Socioeconomic History   Marital status: Single    Spouse name: Not on file   Number of children: Not on file   Years of education: Not on file   Highest education  level: Not on file  Occupational History   Not on file  Tobacco Use   Smoking status: Never   Smokeless tobacco: Never  Vaping Use   Vaping Use: Never used  Substance and Sexual Activity   Alcohol use: Yes   Drug use: Never   Sexual activity: Not on file  Other Topics Concern   Not on file  Social History Narrative   Not on file   Social Determinants of Health   Financial Resource Strain: Not on file  Food Insecurity: Not on file  Transportation Needs: Not on file  Physical Activity: Not on file  Stress: Not on file  Social Connections: Not on file  Intimate Partner Violence: Not on file    Past Surgical History:  Procedure Laterality Date   AMPUTATION Right 08/14/2021   Procedure: AMPUTATION FOOT;  Surgeon: Felecia Shelling, DPM;  Location: [facility];  Service: Podiatry;  Laterality: Right;   AMPUTATION Right 11/04/2021   Procedure: AMPUTATION BELOW KNEE;  Surgeon: Learta Codding, MD;  Location: [facility];  Service: Vascular;  Laterality: Right;   BACK SURGERY  INCISION AND DRAINAGE Right 08/14/2021   Procedure: INCISION AND DRAINAGE;  Surgeon: Felecia Shelling, DPM;  Location: [facility];  Service: Podiatry;  Laterality: Right;   IRRIGATION AND DEBRIDEMENT FOOT Right 05/06/2021   Procedure: IRRIGATION AND DEBRIDEMENT FOOT;  Surgeon: [name], DPM;  Location: [facility];  Service: Podiatry;  Laterality: Right;   LOWER EXTREMITY ANGIOGRAPHY Right 11/02/2021   Procedure: Lower Extremity Angiography;  Surgeon: Annice Needy, MD;  Location: [facility];  Service: Cardiovascular;  Laterality: Right;   TRANSMETATARSAL AMPUTATION Right 05/09/2021   Procedure: TRANSMETATARSAL AMPUTATION;  Surgeon: [name], DPM;  Location: [facility];  Service: Podiatry;  Laterality: Right;    Family History  Problem Relation Age of Onset   Diabetes Mellitus II Sister     No Known Allergies     Latest Ref Rng & Units 11/06/2021    3:51 AM 11/05/2021    4:21 AM 11/04/2021   11:59  AM  CBC  WBC 4.0 - 10.5 K/uL 14.2  15.6  13.4   Hemoglobin 13.0 - 17.0 g/dL 9.2  9.4  71.6   Hematocrit 39.0 - 52.0 % 27.8  28.3  30.1   Platelets 150 - 400 K/uL 409  376  401       CMP     Component Value Date/Time   NA 136 11/06/2021 0351   K 4.6 11/06/2021 0351   CL 102 11/06/2021 0351   CO2 27 11/06/2021 0351   GLUCOSE 198 (H) 11/06/2021 0351   BUN 32 (H) 11/06/2021 0351   CREATININE 1.20 11/07/2021 0515   CALCIUM 8.6 (L) 11/06/2021 0351   PROT 6.5 10/31/2021 0445   ALBUMIN 2.4 (L) 10/31/2021 0445   AST 15 10/31/2021 0445   ALT 23 10/31/2021 0445   ALKPHOS 176 (H) 10/31/2021 0445   BILITOT 0.5 10/31/2021 0445   GFRNONAA >60 11/07/2021 0515     No results found.     Assessment & Plan:   1. Hx of BKA, right [facility]) Mr. Virgil was seen for further evaluation for right below knee prosthesis.  The is a 56 year old male who had amputation on 11/04/2021.  At the time he is well healed and ready for fitting of new right knee prosthesis.  He is a highly motivated individual and should do well once fitted with prosthesis.  He has no problem returning to a K3 ambulator, which will allow him to walk inside and outside of his home and overload level barriers.   2. 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  Hgb A1C to be monitored as already arranged by primary service   3. Primary hypertension Continue antihypertensive medications as already ordered, these medications have been reviewed and there are no changes at this time.    Current Outpatient Medications on File Prior to Visit  Medication Sig Dispense Refill   ACETAMINOPHEN EXTRA STRENGTH 500 MG tablet Take by mouth.     ascorbic acid (VITAMIN C) 500 MG tablet Take 1 tablet (500 mg total) by mouth 2 (two) times daily. 60 tablet 1   famotidine (PEPCID) 20 MG tablet Take 1 tablet (20 mg  total) by mouth daily. 30 tablet 1   FLUoxetine (PROZAC) 40 MG capsule Take 40 mg by mouth daily as needed.     gabapentin (NEURONTIN) 300 MG capsule Take by mouth.     glycopyrrolate (ROBINUL)  1 MG tablet Take 1 tablet (1 mg total) by mouth 2 (two) times daily. 60 tablet 1   lisinopril (ZESTRIL) 20 MG tablet Take 20 mg by mouth daily.     lovastatin (MEVACOR) 20 MG tablet Take 20 mg by mouth daily.     Multiple Vitamin (MULTIVITAMIN WITH MINERALS) TABS tablet Take 1 tablet by mouth daily.     sitaGLIPtin-metformin (JANUMET) 50-1000 MG tablet Take 1 tablet by mouth 2 (two) times daily with a meal.     traZODone (DESYREL) 50 MG tablet Take 50 mg by mouth at bedtime.     insulin glargine (LANTUS) 100 UNIT/ML injection Inject 0.2 mLs (20 Units total) into the skin daily. (Patient taking differently: Inject 50 Units into the skin daily.) 6 mL 2   oxyCODONE (OXY IR/ROXICODONE) 5 MG immediate release tablet Take 1 tablet (5 mg total) by mouth 4 (four) times daily as needed. (Patient not taking: Reported on 02/05/2022) 25 tablet 0   No current facility-administered medications on file prior to visit.    There are no Patient Instructions on file for this visit. No follow-ups on file.   Georgiana Spinner, NP

## 2022-03-19 ENCOUNTER — Ambulatory Visit (INDEPENDENT_AMBULATORY_CARE_PROVIDER_SITE_OTHER): Payer: Self-pay | Admitting: Nurse Practitioner

## 2023-03-03 ENCOUNTER — Encounter (INDEPENDENT_AMBULATORY_CARE_PROVIDER_SITE_OTHER): Payer: Self-pay | Admitting: Nurse Practitioner
